# Patient Record
Sex: Female | Born: 1945 | Race: White | Hispanic: No | Marital: Married | Smoking: Never smoker
Health system: Southern US, Community
[De-identification: ages and names within clinical notes are randomized; demographics above are authoritative.]

## PROBLEM LIST (undated history)

## (undated) DIAGNOSIS — M51369 Other intervertebral disc degeneration, lumbar region without mention of lumbar back pain or lower extremity pain: Secondary | ICD-10-CM

## (undated) DIAGNOSIS — M4800 Spinal stenosis, site unspecified: Secondary | ICD-10-CM

## (undated) DIAGNOSIS — G4700 Insomnia, unspecified: Secondary | ICD-10-CM

## (undated) DIAGNOSIS — G959 Disease of spinal cord, unspecified: Secondary | ICD-10-CM

## (undated) DIAGNOSIS — M47816 Spondylosis without myelopathy or radiculopathy, lumbar region: Secondary | ICD-10-CM

## (undated) DIAGNOSIS — M1990 Unspecified osteoarthritis, unspecified site: Secondary | ICD-10-CM

## (undated) DIAGNOSIS — F419 Anxiety disorder, unspecified: Secondary | ICD-10-CM

## (undated) DIAGNOSIS — E559 Vitamin D deficiency, unspecified: Secondary | ICD-10-CM

## (undated) DIAGNOSIS — T8859XA Other complications of anesthesia, initial encounter: Secondary | ICD-10-CM

## (undated) DIAGNOSIS — E785 Hyperlipidemia, unspecified: Secondary | ICD-10-CM

## (undated) DIAGNOSIS — I499 Cardiac arrhythmia, unspecified: Secondary | ICD-10-CM

## (undated) DIAGNOSIS — L719 Rosacea, unspecified: Secondary | ICD-10-CM

## (undated) DIAGNOSIS — Z9289 Personal history of other medical treatment: Secondary | ICD-10-CM

## (undated) DIAGNOSIS — M5412 Radiculopathy, cervical region: Secondary | ICD-10-CM

## (undated) DIAGNOSIS — I10 Essential (primary) hypertension: Secondary | ICD-10-CM

## (undated) DIAGNOSIS — M47812 Spondylosis without myelopathy or radiculopathy, cervical region: Secondary | ICD-10-CM

## (undated) DIAGNOSIS — R001 Bradycardia, unspecified: Secondary | ICD-10-CM

## (undated) DIAGNOSIS — J40 Bronchitis, not specified as acute or chronic: Secondary | ICD-10-CM

## (undated) DIAGNOSIS — K219 Gastro-esophageal reflux disease without esophagitis: Secondary | ICD-10-CM

## (undated) DIAGNOSIS — E669 Obesity, unspecified: Secondary | ICD-10-CM

## (undated) HISTORY — PX: OTHER SURGICAL HISTORY: SHX169

## (undated) HISTORY — DX: Bronchitis, not specified as acute or chronic: J40

## (undated) HISTORY — DX: Gastro-esophageal reflux disease without esophagitis: K21.9

## (undated) HISTORY — DX: Obesity, unspecified: E66.9

## (undated) HISTORY — DX: Hyperlipidemia, unspecified: E78.5

## (undated) HISTORY — DX: Essential (primary) hypertension: I10

## (undated) HISTORY — PX: ABDOMINAL HYSTERECTOMY: SHX81

## (undated) HISTORY — DX: Anxiety disorder, unspecified: F41.9

## (undated) HISTORY — DX: Bradycardia, unspecified: R00.1

## (undated) HISTORY — DX: Spondylosis without myelopathy or radiculopathy, lumbar region: M47.816

## (undated) HISTORY — PX: KNEE ARTHROSCOPY: SUR90

## (undated) HISTORY — DX: Rosacea, unspecified: L71.9

## (undated) HISTORY — PX: BILATERAL CARPAL TUNNEL RELEASE: SHX6508

## (undated) HISTORY — DX: Insomnia, unspecified: G47.00

## (undated) HISTORY — DX: Unspecified osteoarthritis, unspecified site: M19.90

## (undated) HISTORY — PX: INCISION TENDON SHEATH HAND: SUR698

## (undated) HISTORY — PX: CERVICAL CONE BIOPSY: SUR198

## (undated) HISTORY — DX: Personal history of other medical treatment: Z92.89

---

## 1979-01-28 HISTORY — PX: HYSTERECTOMY ABDOMINAL WITH SALPINGECTOMY: SHX6725

## 2002-01-27 HISTORY — PX: COLONOSCOPY: SHX174

## 2003-01-28 HISTORY — PX: HALLUX VALGUS CORRECTION: SUR315

## 2004-07-20 ENCOUNTER — Emergency Department: Payer: Self-pay | Admitting: Emergency Medicine

## 2004-08-14 ENCOUNTER — Ambulatory Visit: Payer: Self-pay | Admitting: Family Medicine

## 2005-02-18 ENCOUNTER — Ambulatory Visit: Payer: Self-pay | Admitting: Family Medicine

## 2005-04-17 ENCOUNTER — Ambulatory Visit: Payer: Self-pay

## 2005-10-07 ENCOUNTER — Ambulatory Visit: Payer: Self-pay

## 2006-12-16 ENCOUNTER — Ambulatory Visit: Payer: Self-pay | Admitting: Family Medicine

## 2007-01-28 DIAGNOSIS — Z9289 Personal history of other medical treatment: Secondary | ICD-10-CM

## 2007-01-28 HISTORY — DX: Personal history of other medical treatment: Z92.89

## 2007-03-31 ENCOUNTER — Ambulatory Visit: Payer: Self-pay | Admitting: Specialist

## 2007-05-20 ENCOUNTER — Ambulatory Visit: Payer: Self-pay | Admitting: Family Medicine

## 2008-10-11 ENCOUNTER — Ambulatory Visit: Payer: Self-pay | Admitting: Family Medicine

## 2008-11-08 ENCOUNTER — Emergency Department: Payer: Self-pay | Admitting: Emergency Medicine

## 2009-12-05 ENCOUNTER — Ambulatory Visit: Payer: Self-pay | Admitting: Family Medicine

## 2010-06-18 ENCOUNTER — Ambulatory Visit: Payer: Self-pay | Admitting: Rheumatology

## 2010-06-28 ENCOUNTER — Ambulatory Visit: Payer: Self-pay | Admitting: Rheumatology

## 2010-07-26 ENCOUNTER — Ambulatory Visit: Payer: Self-pay | Admitting: Anesthesiology

## 2010-07-30 ENCOUNTER — Ambulatory Visit: Payer: Self-pay | Admitting: Orthopedic Surgery

## 2010-09-04 ENCOUNTER — Ambulatory Visit: Payer: Self-pay | Admitting: Physician Assistant

## 2010-09-23 ENCOUNTER — Ambulatory Visit: Payer: Self-pay | Admitting: Orthopedic Surgery

## 2010-10-14 ENCOUNTER — Ambulatory Visit: Payer: Self-pay | Admitting: Orthopedic Surgery

## 2010-10-28 HISTORY — PX: COLONOSCOPY: SHX174

## 2010-11-21 ENCOUNTER — Inpatient Hospital Stay: Payer: Self-pay | Admitting: Orthopedic Surgery

## 2010-11-21 HISTORY — PX: JOINT REPLACEMENT: SHX530

## 2010-11-25 LAB — PATHOLOGY REPORT

## 2011-08-30 ENCOUNTER — Ambulatory Visit: Payer: Self-pay | Admitting: Medical

## 2011-09-12 ENCOUNTER — Ambulatory Visit: Payer: Self-pay

## 2011-10-27 ENCOUNTER — Ambulatory Visit: Payer: Self-pay | Admitting: Orthopedic Surgery

## 2011-10-27 LAB — PROTIME-INR: Prothrombin Time: 12.7 secs (ref 11.5–14.7)

## 2011-10-27 LAB — SEDIMENTATION RATE: Erythrocyte Sed Rate: 5 mm/h

## 2011-10-27 LAB — APTT: Activated PTT: 28.6 s

## 2011-10-27 LAB — MRSA PCR SCREENING

## 2011-11-01 ENCOUNTER — Ambulatory Visit: Payer: Self-pay | Admitting: Orthopedic Surgery

## 2011-11-04 ENCOUNTER — Inpatient Hospital Stay: Payer: Self-pay | Admitting: Orthopedic Surgery

## 2011-11-04 HISTORY — PX: JOINT REPLACEMENT: SHX530

## 2011-11-05 LAB — BASIC METABOLIC PANEL
BUN: 6 mg/dL — ABNORMAL LOW (ref 7–18)
Calcium, Total: 8.1 mg/dL — ABNORMAL LOW (ref 8.5–10.1)
Co2: 26 mmol/L (ref 21–32)
Creatinine: 0.47 mg/dL — ABNORMAL LOW (ref 0.60–1.30)
EGFR (African American): >60
Glucose: 108 mg/dL — ABNORMAL HIGH (ref 65–99)
Osmolality: 265 (ref 275–301)
Potassium: 3.5 mmol/L (ref 3.5–5.1)

## 2011-11-05 LAB — HEMOGLOBIN: HGB: 11.8 g/dL — ABNORMAL LOW (ref 12.0–16.0)

## 2011-11-06 LAB — URINALYSIS, COMPLETE
Blood: NEGATIVE
Glucose,UR: NEGATIVE mg/dL (ref 0–75)
Ketone: NEGATIVE
Specific Gravity: 1.002 (ref 1.003–1.030)
Squamous Epithelial: NONE SEEN
WBC UR: <1 /HPF (ref 0–5)

## 2011-11-06 LAB — PATHOLOGY REPORT

## 2011-12-18 ENCOUNTER — Ambulatory Visit: Payer: Self-pay | Admitting: Orthopedic Surgery

## 2011-12-18 HISTORY — PX: KNEE ARTHROSCOPY: SUR90

## 2012-02-05 ENCOUNTER — Ambulatory Visit: Payer: Self-pay | Admitting: Orthopedic Surgery

## 2012-03-03 ENCOUNTER — Ambulatory Visit: Payer: Self-pay | Admitting: Family Medicine

## 2012-03-18 ENCOUNTER — Ambulatory Visit: Payer: Self-pay | Admitting: Orthopedic Surgery

## 2012-04-16 ENCOUNTER — Other Ambulatory Visit: Payer: Self-pay

## 2012-04-16 LAB — SYNOVIAL CELL COUNT + DIFF, W/ CRYSTALS
Basophil: 0 %
Eosinophil: 1 %
Nucleated Cell Count: 74364 /mm3

## 2012-04-20 LAB — BODY FLUID CULTURE

## 2012-04-27 HISTORY — PX: JOINT REPLACEMENT: SHX530

## 2012-05-11 ENCOUNTER — Ambulatory Visit: Payer: Self-pay | Admitting: Orthopedic Surgery

## 2012-05-11 LAB — BASIC METABOLIC PANEL
Anion Gap: 5 — ABNORMAL LOW (ref 7–16)
BUN: 10 mg/dL (ref 7–18)
Chloride: 104 mmol/L (ref 98–107)
Co2: 29 mmol/L (ref 21–32)
Creatinine: 0.66 mg/dL (ref 0.60–1.30)
EGFR (African American): >60
Osmolality: 274 (ref 275–301)
Sodium: 138 mmol/L (ref 136–145)

## 2012-05-11 LAB — CBC
HCT: 38.5 % (ref 35.0–47.0)
MCH: 29.7 pg (ref 26.0–34.0)
MCHC: 33.7 g/dL (ref 32.0–36.0)
Platelet: 164 10*3/uL (ref 150–440)
WBC: 4.2 10*3/uL (ref 3.6–11.0)

## 2012-05-11 LAB — MRSA PCR SCREENING

## 2012-05-11 LAB — SEDIMENTATION RATE: Erythrocyte Sed Rate: 8 mm/hr (ref 0–30)

## 2012-05-18 ENCOUNTER — Inpatient Hospital Stay: Payer: Self-pay | Admitting: Orthopedic Surgery

## 2012-05-19 LAB — BASIC METABOLIC PANEL
Anion Gap: 5 — ABNORMAL LOW (ref 7–16)
BUN: 8 mg/dL (ref 7–18)
Chloride: 104 mmol/L (ref 98–107)
EGFR (African American): >60
EGFR (Non-African Amer.): >60
Glucose: 93 mg/dL (ref 65–99)
Osmolality: 272 (ref 275–301)
Potassium: 3.3 mmol/L — ABNORMAL LOW (ref 3.5–5.1)
Sodium: 137 mmol/L (ref 136–145)

## 2012-05-19 LAB — HEMOGLOBIN: HGB: 11.4 g/dL — ABNORMAL LOW (ref 12.0–16.0)

## 2012-05-20 LAB — BASIC METABOLIC PANEL
Anion Gap: 3 — ABNORMAL LOW (ref 7–16)
BUN: 6 mg/dL — ABNORMAL LOW (ref 7–18)
Chloride: 106 mmol/L (ref 98–107)
Co2: 29 mmol/L (ref 21–32)
Creatinine: 0.74 mg/dL (ref 0.60–1.30)
EGFR (Non-African Amer.): >60
Glucose: 125 mg/dL — ABNORMAL HIGH (ref 65–99)
Osmolality: 275 (ref 275–301)
Potassium: 3.3 mmol/L — ABNORMAL LOW (ref 3.5–5.1)
Sodium: 138 mmol/L (ref 136–145)

## 2012-05-21 LAB — PATHOLOGY REPORT

## 2012-06-30 DIAGNOSIS — Z96659 Presence of unspecified artificial knee joint: Secondary | ICD-10-CM | POA: Diagnosis not present

## 2012-08-18 DIAGNOSIS — M719 Bursopathy, unspecified: Secondary | ICD-10-CM | POA: Diagnosis not present

## 2012-08-18 DIAGNOSIS — M67919 Unspecified disorder of synovium and tendon, unspecified shoulder: Secondary | ICD-10-CM | POA: Diagnosis not present

## 2012-08-30 DIAGNOSIS — M25519 Pain in unspecified shoulder: Secondary | ICD-10-CM | POA: Diagnosis not present

## 2012-09-03 DIAGNOSIS — M67919 Unspecified disorder of synovium and tendon, unspecified shoulder: Secondary | ICD-10-CM | POA: Diagnosis not present

## 2012-09-03 DIAGNOSIS — M719 Bursopathy, unspecified: Secondary | ICD-10-CM | POA: Diagnosis not present

## 2012-09-13 DIAGNOSIS — M6281 Muscle weakness (generalized): Secondary | ICD-10-CM | POA: Diagnosis not present

## 2012-09-13 DIAGNOSIS — M67919 Unspecified disorder of synovium and tendon, unspecified shoulder: Secondary | ICD-10-CM | POA: Diagnosis not present

## 2012-09-13 DIAGNOSIS — M25519 Pain in unspecified shoulder: Secondary | ICD-10-CM | POA: Diagnosis not present

## 2012-09-13 DIAGNOSIS — M719 Bursopathy, unspecified: Secondary | ICD-10-CM | POA: Diagnosis not present

## 2012-09-13 DIAGNOSIS — M25619 Stiffness of unspecified shoulder, not elsewhere classified: Secondary | ICD-10-CM | POA: Diagnosis not present

## 2012-09-15 DIAGNOSIS — M67919 Unspecified disorder of synovium and tendon, unspecified shoulder: Secondary | ICD-10-CM | POA: Diagnosis not present

## 2012-09-15 DIAGNOSIS — M25619 Stiffness of unspecified shoulder, not elsewhere classified: Secondary | ICD-10-CM | POA: Diagnosis not present

## 2012-09-15 DIAGNOSIS — M25519 Pain in unspecified shoulder: Secondary | ICD-10-CM | POA: Diagnosis not present

## 2012-09-15 DIAGNOSIS — M6281 Muscle weakness (generalized): Secondary | ICD-10-CM | POA: Diagnosis not present

## 2012-09-15 DIAGNOSIS — M719 Bursopathy, unspecified: Secondary | ICD-10-CM | POA: Diagnosis not present

## 2012-09-20 DIAGNOSIS — E785 Hyperlipidemia, unspecified: Secondary | ICD-10-CM | POA: Diagnosis not present

## 2012-09-20 DIAGNOSIS — I10 Essential (primary) hypertension: Secondary | ICD-10-CM | POA: Diagnosis not present

## 2012-09-20 DIAGNOSIS — K219 Gastro-esophageal reflux disease without esophagitis: Secondary | ICD-10-CM | POA: Diagnosis not present

## 2012-09-20 DIAGNOSIS — G4700 Insomnia, unspecified: Secondary | ICD-10-CM | POA: Diagnosis not present

## 2012-09-21 DIAGNOSIS — E785 Hyperlipidemia, unspecified: Secondary | ICD-10-CM | POA: Diagnosis not present

## 2012-09-21 DIAGNOSIS — I10 Essential (primary) hypertension: Secondary | ICD-10-CM | POA: Diagnosis not present

## 2012-09-21 DIAGNOSIS — R634 Abnormal weight loss: Secondary | ICD-10-CM | POA: Diagnosis not present

## 2012-09-29 DIAGNOSIS — M25519 Pain in unspecified shoulder: Secondary | ICD-10-CM | POA: Diagnosis not present

## 2012-09-29 DIAGNOSIS — M6281 Muscle weakness (generalized): Secondary | ICD-10-CM | POA: Diagnosis not present

## 2012-09-29 DIAGNOSIS — M719 Bursopathy, unspecified: Secondary | ICD-10-CM | POA: Diagnosis not present

## 2012-09-29 DIAGNOSIS — M67919 Unspecified disorder of synovium and tendon, unspecified shoulder: Secondary | ICD-10-CM | POA: Diagnosis not present

## 2012-09-29 DIAGNOSIS — M25619 Stiffness of unspecified shoulder, not elsewhere classified: Secondary | ICD-10-CM | POA: Diagnosis not present

## 2012-10-14 ENCOUNTER — Ambulatory Visit: Payer: Self-pay | Admitting: Family Medicine

## 2012-10-14 DIAGNOSIS — E2839 Other primary ovarian failure: Secondary | ICD-10-CM | POA: Diagnosis not present

## 2012-10-14 DIAGNOSIS — Z13820 Encounter for screening for osteoporosis: Secondary | ICD-10-CM | POA: Diagnosis not present

## 2012-11-05 DIAGNOSIS — H35039 Hypertensive retinopathy, unspecified eye: Secondary | ICD-10-CM | POA: Diagnosis not present

## 2013-02-09 DIAGNOSIS — G4700 Insomnia, unspecified: Secondary | ICD-10-CM | POA: Diagnosis not present

## 2013-02-09 DIAGNOSIS — F341 Dysthymic disorder: Secondary | ICD-10-CM | POA: Diagnosis not present

## 2013-04-14 DIAGNOSIS — I10 Essential (primary) hypertension: Secondary | ICD-10-CM | POA: Diagnosis not present

## 2013-04-14 DIAGNOSIS — K219 Gastro-esophageal reflux disease without esophagitis: Secondary | ICD-10-CM | POA: Diagnosis not present

## 2013-04-14 DIAGNOSIS — Z0000 Encounter for general adult medical examination without abnormal findings: Secondary | ICD-10-CM | POA: Diagnosis not present

## 2013-04-14 DIAGNOSIS — E785 Hyperlipidemia, unspecified: Secondary | ICD-10-CM | POA: Diagnosis not present

## 2013-05-17 DIAGNOSIS — J40 Bronchitis, not specified as acute or chronic: Secondary | ICD-10-CM | POA: Diagnosis not present

## 2013-05-17 DIAGNOSIS — R7981 Abnormal blood-gas level: Secondary | ICD-10-CM | POA: Diagnosis not present

## 2013-05-20 DIAGNOSIS — J40 Bronchitis, not specified as acute or chronic: Secondary | ICD-10-CM | POA: Diagnosis not present

## 2013-07-11 DIAGNOSIS — Z96649 Presence of unspecified artificial hip joint: Secondary | ICD-10-CM | POA: Diagnosis not present

## 2013-07-11 DIAGNOSIS — Z96659 Presence of unspecified artificial knee joint: Secondary | ICD-10-CM | POA: Diagnosis not present

## 2013-09-27 DIAGNOSIS — Z23 Encounter for immunization: Secondary | ICD-10-CM | POA: Diagnosis not present

## 2013-09-27 DIAGNOSIS — E785 Hyperlipidemia, unspecified: Secondary | ICD-10-CM | POA: Diagnosis not present

## 2013-09-27 DIAGNOSIS — F341 Dysthymic disorder: Secondary | ICD-10-CM | POA: Diagnosis not present

## 2013-09-27 DIAGNOSIS — I10 Essential (primary) hypertension: Secondary | ICD-10-CM | POA: Diagnosis not present

## 2013-09-29 DIAGNOSIS — E785 Hyperlipidemia, unspecified: Secondary | ICD-10-CM | POA: Diagnosis not present

## 2013-09-29 DIAGNOSIS — I10 Essential (primary) hypertension: Secondary | ICD-10-CM | POA: Diagnosis not present

## 2013-11-07 DIAGNOSIS — J40 Bronchitis, not specified as acute or chronic: Secondary | ICD-10-CM | POA: Diagnosis not present

## 2013-11-22 DIAGNOSIS — H2513 Age-related nuclear cataract, bilateral: Secondary | ICD-10-CM | POA: Diagnosis not present

## 2014-05-16 NOTE — Discharge Summary (Signed)
PATIENT NAME:  Kari Lee, Kari Lee MR#:  [id number] DATE OF BIRTH:  1946/01/17  DATE OF ADMISSION:  11/04/2011 DATE OF DISCHARGE:  11/07/2011  ADDENDUM  HISTORY: The patient is a 69 year old who had severe left hip osteoarthritis. Left leg has been giving out on her. She has had difficulty extending the leg and weight bearing.   PHYSICAL EXAMINATION: The patient has anterior groin discomfort with range of motion of the left hip with internal and external rotation as well as flexion. She has limited internal and external rotation. HEENT: Head normocephalic, atraumatic. LUNGS: Clear to auscultation. HEART: Regular rate and rhythm.   HOSPITAL COURSE: The patient was admitted to the hospital on 11/04/2011. She had surgery that same day and was brought to the orthopedic floor from the PAC-U in stable condition. On postoperative day one, vital signs and lab work was monitored. She progressed very well with physical therapy. She had a bowel movement and was ready for discharge on 11/07/2011.   DISCHARGE INSTRUCTIONS: The patient was here for a total hip replacement on the left. She should elevate the affected foot and leg on 1 or 2 pillows with the foot higher than the knee. She may resume a regular diet as tolerated. She should use Xarelto 10 mg oral once a day until complete. Pain medications consist of Ultram 1 to 2 tablets every six hours as needed for pain and oxycodone 5 to 10 mg every four hours as needed for pain. Wound care - apply an ice pack to the affected area. Do not get the dressing or bandage wet or dirty. Call [facility] if the dressing gets water under it. Leave the dressing on. Symptoms to report - [facility] if any of the following occur. Bright red bleeding from the incision wound, fever above 101.5 degrees, redness, swelling, or drainage at the incision. Call [facility] if you experience any increased leg pain, numbness or weakness in the  legs or bowel or bladder symptoms. She is referred to home physical therapy. She should contact us if they have not contacted her within 48 hours. She has a follow-up appointment scheduled with [facility] in two weeks. She should call for an appointment.      DISCHARGE MEDICATIONS:  1. Celebrex 200 mg one orally once a day in the morning.  2. Hydrochlorothiazide/losartan 12.5 mg/50 mg oral tablet 1 tablet orally once a day in the morning.  3. Simvastatin 40 mg 1 tablet orally once a day at bedtime.  4. Calcium 600 mg plus D 1 tablet orally two times a day.  5. Norco 5 mg/325 mg oral tablet 1 tablet orally six hours as needed. 6. Centrum Silver Ultra Women's 1 tab orally once a day in the morning.  7. Ambien 10 mg oral tablet 1 tablet orally once daily at bedtime.  8. Omeprazole 20 mg oral delayed release tablet 1 tablet orally once a day in the morning.  9. Estropipate 0.75 mg oral tablet 1 tablet orally once a day in the morning. 10. Xarelto 10 mg 1 tab p.o. every morning until finished.  ____________________________ Evon Slackhomas C. Jettson Crable, PA-C tcg:slb D: 11/07/2011 12:54:46 ET T: 11/07/2011 15:32:56 ET JOB#: [id number]  cc: Evon Slackhomas C. Carly Sabo, PA-C, <Dictator> Evon SlackHOMAS C Marche Hottenstein GeorgiaPA ELECTRONICALLY SIGNED 11/11/2011 12:38

## 2014-05-16 NOTE — Discharge Summary (Signed)
PATIENT NAME:  Kari Lee, Kari Lee MR#:  [id number] DATE OF BIRTH:  1945/06/02  DATE OF ADMISSION:  11/04/2011 DATE OF DISCHARGE:  11/07/2011  ADMITTING DIAGNOSIS: Left hip osteoarthritis.   DISCHARGE DIAGNOSIS: Left hip osteoarthritis.   PROCEDURE PERFORMED: Left total hip replacement, anterior approach.   SURGEON: Leitha SchullerMichael J. Menz, M.D.   ASSISTANT: Devota PaceApril Berndt, NP, Cranston Neighborhris Gaines, PA-C.   ANESTHESIA: Spinal.   ESTIMATED BLOOD LOSS: 500 mL with 250 mL reinfused from Cell Saver.   SPECIMENS: Femoral head.   COMPLICATIONS: None.   CONDITION: To recovery room stable.  IMPLANTS: Medacta Amis standard stem size 3, a medium 28 mm head with a 50 mm dual mobility liner and a 50 mm DM with Versa Fit cup.    CONDITION: To recovery room stable.   HISTORY: Patient is a 69 year old female who has had medial compartment arthritis in the left knee. She has had severe left hip osteoarthritis. About five to six days ago she was walking down steps twisted her knee and felt a pop. Since that time she has had difficulty standing, bearing weight on the leg. She says it feels like her right knee did when she had a torn meniscus there.(dictation cut off here)   ____________________________ [name], PA-C tcg:cms D: 11/07/2011 08:05:18 ET T: 11/07/2011 13:17:15 ET JOB#: [id number]  cc: [name], PA-C, <Dictator> Evon SlackHOMAS C GAINES PA ELECTRONICALLY SIGNED 12/04/2011 12:20

## 2014-05-16 NOTE — Op Note (Signed)
PATIENT NAME:  Kari Lee, Kari Lee MR#:  [id number] DATE OF BIRTH:  1945-10-19  DATE OF PROCEDURE:  12/18/2011  PREOPERATIVE DIAGNOSIS: Osteoarthritis and medial meniscus tear, left knee.   POSTOPERATIVE DIAGNOSES:  1. Osteoarthritis and medial meniscus tear, left knee. 2. Lateral meniscus tear and plica band.   PROCEDURES: 1. Arthroscopy, left knee, with partial medial and lateral meniscectomy. 2. Partial synovectomy with plica excision.  ANESTHESIA: General.   SURGEON: Leitha SchullerMichael J. Jarman Litton, MD    DESCRIPTION OF PROCEDURE: The patient was brought to the operating room and after adequate anesthesia was obtained the left leg was placed in the arthroscopic legholder. After prepping and draping in the usual sterile fashion, appropriate patient identification and time-out procedures were completed. An inferolateral portal was made and the arthroscope was introduced. Initial inspection revealed moderately advanced patellofemoral degenerative change and a very thick large medial plica. Coming to the medial compartment, there was an approximately 3 mm wide 1 cm long area of the tibial condyle with exposed bone. Adjacent to this was a complex and very large tear of the medial meniscus. It involved the entire middle and posterior thirds of the meniscus. There were vertical and horizontal components. The ACL was intact and the lateral compartment showed relatively intact articular cartilage with tear at the junction of the middle and posterior thirds of degenerative flap tear. This was debrided with use of an ArthroCare wand. Going to the medial compartment, meniscal punch shaver and then ArthroCare wand were used to ablate the tissue and a shaver and ArthroCare wand were used to excise the medial plica that was impinging over the medial femoral condyle as well as getting up into the patellofemoral joint. After checking the gutters that there were no loose bodies, the knee was thoroughly irrigated and all  instrumentation withdrawn. The wounds were closed with simple interrupted 4-0 nylon skin suture and the knee injected with 20 mL of 0.5% Sensorcaine with epinephrine. Sterile dressings of Xeroform, 4 x 4's, Webril, and Ace wrap were applied. The patient was sent to the recovery room in stable condition.   ESTIMATED BLOOD LOSS: Minimal.   COMPLICATIONS: None.   SPECIMENS: None. Pre and postprocedure pictures obtained.   ____________________________ Leitha SchullerMichael J. Belissa Kooy, MD mjm:drc D: 12/19/2011 00:24:09 ET T: 12/19/2011 10:04:14 ET JOB#: [id number]  cc: Leitha SchullerMichael J. Drianna Chandran, MD, <Dictator> Leitha SchullerMICHAEL J Zacharia Sowles MD ELECTRONICALLY SIGNED 12/19/2011 12:26

## 2014-05-16 NOTE — Op Note (Signed)
PATIENT NAME:  Kari Lee, Kari Lee MR#:  [id number] DATE OF BIRTH:  08-Jun-1945  DATE OF PROCEDURE:  11/04/2011  PREOPERATIVE DIAGNOSIS: Left hip osteoarthritis.   POSTOPERATIVE DIAGNOSIS: Left hip osteoarthritis.   PROCEDURE PERFORMED: left total hip replacement, anterior approach   SURGEON: Leitha Schuller, M.D.   ASSISTANT: April Berndt, NP, Cranston Neighbor, PA-C  ANESTHESIA: Spinal.  DESCRIPTION OF PROCEDURE: The patient was brought to the Operating Room and after adequate anesthesia was obtained the patient was placed on the operative table with the Medacta  attachment for anterior hip approach. The left leg was placed in the traction boot and the right leg in a well legholder. C-arm was brought in and preop C-arm view was printed for subsequent comparison of the trials. The hip was then prepped and draped using the barrier drape method. An anterior incision was made in line with the anterior/superior iliac spine and lateral carpal condyle. Skin and subcutaneous tissue were incised and the tensor muscle was identified and fascia incised, the muscle retracted laterally, and the deep fascia was then incised. The interval between the rectus and gluteus was then identified. The rectus sheath was incised and the rectus retracted medially. Anterior circumflex femoral vessels cauterized. The anterior capsule was exposed and a capsulotomy carried out with retractors placed. A femoral neck cut was made with C-arm helping to guide this. The head was then removed. It was noted to have eburnated bone as did the acetabulum. The labrum was excised along with ligament of teres and there was noted to be significant wear in the acetabulum as well. Sequential reaming was carried out at 50 mm. This gave good bleeding bone and it appeared to be the appropriate size, 50 trial fit well. A 50 component was then impacted into place and was stable. Next, the leg was externally rotated and the posterior capsule was released to  allow for lateralization and elevation of the femur. With the leg dropped into extension, externally rotated and adducted the proximal femur was well visualized. Sequential broaching was carried out after first using a box osteotome and trial broach. The #3 stem was very snug and with a #3 stem trials were placed. It appeared to be the appropriate size and so the final #3 stem was impacted.  It was trialed with the large and medium heads. The medium appeared to give a better fit compared to preop x-ray and was chosen as the final components. Those components were assembled to the dual mobility head. This was placed onto the stem, impacted, and the hip was reduced and was very stable. Leg lengths appeared equal and final x-ray showed equal leg lengths. The wound was irrigated. The deep tissue was infiltrated with 10 mg of morphine and 30 mL of 0.25% Sensorcaine with epinephrine. The capsule was repaired using 0 Ethibond anteriorly. The tensor fascia was repaired using a running heavy quill suture. Subcutaneous Hemovac drain was placed followed by 2-0 Vicryl quill subcutaneously and skin staples. Xeroform, 4 x 4's, ABD, and tape applied. The patient was sent to the recovery room in stable condition.   ESTIMATED BLOOD LOSS: 500 mL with 250 mL reinfused from Cell Saver.   SPECIMENS: Femoral head.   COMPLICATIONS: None.   CONDITION: To recovery room stable.  IMPLANTS: Medacta Amis standard stem size 3, a medium 28 mm head with a 50 mm dull mobility liner, and a 50 mm DM with Versa Fit cup. ____________________________ Leitha Schuller, MD mjm:slb D: 11/04/2011 12:17:57 ET T: 11/04/2011 12:35:08  ET JOB#: [id number]  cc: Leitha SchullerMichael J. Aliviah Spain, MD, <Dictator> Nolon BussingMICHAEL J [facility] MD ELECTRONICALLY SIGNED 11/04/2011 15:10

## 2014-05-19 NOTE — Op Note (Signed)
PATIENT NAME:  Kari Lee, Kari Lee MR#:  [id number] DATE OF BIRTH:  Jun 24, 1945  DATE OF PROCEDURE:  05/18/2012  PREOPERATIVE DIAGNOSIS: Left knee osteoarthritis.   POSTOPERATIVE DIAGNOSIS: Left knee osteoarthritis.   PROCEDURE: Left total knee replacement.   ANESTHESIA: Spinal.   SURGEON: Leitha SchullerMichael J. Sopheap Boehle, M.D.   ASSISTANT:  Cranston Neighborhris Gaines, PA-C.   DESCRIPTION OF PROCEDURE: The patient was brought to the Operating Room and after adequate anesthesia was obtained, the knee was prepped and draped in the usual sterile fashion with a tourniquet applied to the upper thigh. After patient identification and timeout procedures were completed, the tourniquet was raised to 300 mmHg after exsanguinating with an Esmarch. A midline skin incision was made with the knee in flexion, followed by a medial parapatellar arthrotomy, the anterior horns of the menisci excised along with the infrapatellar fat pad and the ACL. The proximal tibia was exposed to allow for application of the Medacta MyKnee cutting guide. After removing cartilage, the block was applied and alignment appeared to be appropriate. Pins were placed and the proximal tibia cut carried out with removal of posterior horns as well of the meniscus. The femoral block was then applied, after excising cartilage again and checking alignment. It also appeared to be appropriate. Anterior, posterior and chamfer cuts were then carried out. Trial components were placed with rotation on the tibial side based off one of the guide pins from the initial cutting block, size 3 on the tibia, size 4 with subsequent 4 narrow implant on the femur. The 12 mm insert gave excellent stability in extension, mid flexion and flexion. Distal drill holes were made through the femoral trial, along with the notch cut for the trochlear groove. These trial components were then removed, after drilling the proximal hole and notch on the tibial side for subsequent implant fixation. The patella was  cut using the cutting guide and it measured a size 2 after drill holes were placed. At this point, the bony surfaces were thoroughly irrigated and dried. A local anesthetic was infiltrated. Exparel was diluted and injected in the surrounding tissue posterior, as well as around the arthrotomy and in the synovium. At this point, the tibial component was cemented into place first, followed by the tibial insert with excess cement removed, followed by the femoral component. The knee was held in extension as the patellar button was clamped into place. Again, all components were cemented. After the cement had set, the knee was thoroughly irrigated. Tourniquet was let down for hemostasis and the wound closed with a heavy quill suture for the arthrotomy, 2-0 quill subcutaneously and skin staples. Xeroform, 4 x 4's, Polar Care, Webril and Ace wrap were applied along with a knee immobilizer. The patient was sent to the recovery room in stable condition.   ESTIMATED BLOOD LOSS: 100 mL.   TOURNIQUET TIME: 59 minutes at 300 mmHg.  IMPLANTS: Medacta GMK primary total knee, left 3 fixed tibial tray with a size 3, 12 mm UC insert, a 4 narrow left standard femoral component and a size 2 cemented patella.   SPECIMEN: Cut ends of bone.   CONDITION: To recovery room stable.   ____________________________ Leitha SchullerMichael J. Hyland Mollenkopf, MD mjm:jm D: 05/18/2012 18:15:45 ET T: 05/18/2012 19:17:09 ET JOB#: [id number]  cc: Leitha SchullerMichael J. Cleopha Indelicato, MD, <Dictator> Leitha SchullerMICHAEL J Aleksi Brummet MD ELECTRONICALLY SIGNED 05/19/2012 7:57

## 2014-05-19 NOTE — Op Note (Signed)
PATIENT NAME:  Kari Lee, Kari Lee MR#:  [id number] DATE OF BIRTH:  [birth year]  DATE OF PROCEDURE:  02/05/2012  PREOPERATIVE DIAGNOSIS: Left medial meniscus recurrent tear and arthritis.   POSTOPERATIVE DIAGNOSIS: Left medial meniscus recurrent tear and arthritis.   PROCEDURE: Arthroscopy of left knee with medial meniscectomy.   SURGEON: Leitha SchullerMichael J. Efraim Vanallen, M.D.   ANESTHESIA: General.  DESCRIPTION OF PROCEDURE: The patient was brought to the operating room and after adequate anesthesia was obtained the left leg was prepped and draped in the usual sterile fashion with the arthroscopic legholder and tourniquet applied. After patient identification and timeout procedures were completed, prior inferolateral portal was opened with scalpel and the arthroscope introduced. Inspection revealed moderate patellofemoral degenerative change, no significant suprapatellar synovitis. Coming around medially, an inferomedial portal was made and a probe introduced. There was exposed bone on the femoral and tibial condyles at the most medial edge. There was fibrillation of the middle third of the meniscus and in the posterior horn there was a new horizontal tear back to the capsule. The ACL was intact and the lateral compartment showed fissuring to the bone but no tear on probing. An arthroscopic shaver was then used to debride the medial meniscus back near its capsule essentially performing complete meniscectomy. An ArthroCare wand was used to ablate the residual tissue and also for hemostasis. The knee was thoroughly irrigated, all instrumentation was withdrawn and the wounds were closed in simple interrupted 4-0 nylon. Sterile dressing of Xeroform, 4 x 4's, Webril and Ace wrap were applied. The patient was sent to the recovery room in stable condition.   ESTIMATED BLOOD LOSS: Minimal.        COMPLICATIONS: None.   SPECIMENS: None.  ____________________________ Leitha SchullerMichael J. Brett Darko, MD mjm:sb D: 02/05/2012 12:00:05  ET T: 02/05/2012 12:19:57 ET JOB#: [id number]  cc: Leitha SchullerMichael J. Tangia Pinard, MD, <Dictator> Leitha SchullerMICHAEL J [place] Mcphee MD ELECTRONICALLY SIGNED 02/05/2012 15:13

## 2014-05-19 NOTE — Discharge Summary (Signed)
PATIENT NAME:  Lee, Kari K MR#:  [id number] DATE OF BIRTH:  [birth year]  DATE OF ADMISSION:  05/18/2012 DATE OF DISCHARGE:  05/21/2012   ADMITTING DIAGNOSIS: Left knee osteoarthritis.   DISCHARGE DIAGNOSIS: Left knee osteoarthritis.   OPERATION: On 05/18/2012, the patient had a left total knee arthroplasty by Dr. Rosita KeaMenz.   ASSISTANT: Cranston Neighborhris Gaines, PA-C   ANESTHESIA: Spinal.   TOURNIQUET TIME: 59 minutes at 300 mmHg.   ESTIMATED BLOOD LOSS: 100 mL.   IMPLANTS: Medacta GMK primary total knee, left 36 tibial tray with a size 3, 12 mm UC insert, and 4 narrow left standard femoral component and a size 2 cemented patella.   CONDITION: The patient was stabilized with no complication and was sent to the recovery room and then down to the orthopedic floor.   HISTORY: The patient is a 69 year old female who presented for evaluation of her left knee. The patient has been refractory to conservative treatment. The patient has had debilitating pain with ambulation and rest even. The patient has tried anti-inflammatories and pain control with no relief, as well as injections.   PHYSICAL EXAMINATION:  GENERAL: Alert female with some difficulty with transfers.  LUNGS: Clear to auscultation.  HEART: Regular rate and rhythm.  MUSCULOSKELETAL: In regard to the left lower extremity, the patient has 10 degrees from extension, 105 degrees flexion with pain. The patient has crepitus with motion. The patient has a large Baker's cyst with discomfort. The patient has intact dorsalis pedis pulse and sensation intact.   HOSPITAL COURSE: After initial admission on the 22nd of April, the patient was brought to the orthopedic floor. On postoperative day 1, the patient's hemoglobin was 11.4 and remained stable there. The patient worked with physical therapy, initially bed to chair and progressed up to ambulating about 250 feet. The patient did, on the day of discharge, have significant swelling and increased pain as  the numbing medicine was wearing off. The patient was a little bit slower with activity, although she did have bowel movement and was ready to be discharged home with home health physical therapy and no other complications.   CONDITION AT DISCHARGE: Stable.   DISPOSITION: The patient was sent home with home health physical therapy.   DISCHARGE INSTRUCTIONS:  1. The patient to [facility] up [facility] in about 2 weeks for staple removal.  2. The patient is to do weight-bear as tolerated on the affected leg.  3. The patient will raise her leg with 1 to 2 pillows.  4. The patient will use thigh-high TED hose on both legs, removed at nighttime.  5. The patient will elevate her heels off the bed.  6. The patient will use regular diet.  7. The patient will use a Polar Care to decrease swelling.  8. The patient will keep her dressing clean and dry and try not to get it wet. The patient will leave it on though.  9. The patient will do physical therapy for gait training and range of motion activities.  10. The patient will call the clinic if there is any bright red bleeding, calf pain, bowel or bladder difficulty or any fever greater than 101.5.   DISCHARGE MEDICATIONS: To resume home medication and to use Xarelto 10 mg orally 1 tablet a day for 12 days. The patient will use Celebrex 200 mg twice a day instead of once a day. The patient will use oxycodone 5 mg 1 tablet every 4 to 6 hours as needed for severe  pain.   ____________________________ Shela Commons. Dedra Skeens, [place] jtm:OSi D: 05/21/2012 07:30:05 ET T: 05/21/2012 07:51:35 ET JOB#: [id number]  cc: J. Dedra Skeens, [place], <Dictator> J Tausha Milhoan [facility] PA ELECTRONICALLY SIGNED 05/22/2012 6:34

## 2015-08-16 LAB — HM MAMMOGRAPHY: HM Mammogram: NORMAL (ref 0–4)

## 2016-02-18 DIAGNOSIS — R69 Illness, unspecified: Secondary | ICD-10-CM | POA: Diagnosis not present

## 2016-03-19 DIAGNOSIS — H6123 Impacted cerumen, bilateral: Secondary | ICD-10-CM | POA: Diagnosis not present

## 2016-03-19 DIAGNOSIS — I10 Essential (primary) hypertension: Secondary | ICD-10-CM | POA: Diagnosis not present

## 2016-03-19 DIAGNOSIS — E782 Mixed hyperlipidemia: Secondary | ICD-10-CM | POA: Diagnosis not present

## 2016-03-19 DIAGNOSIS — Z79899 Other long term (current) drug therapy: Secondary | ICD-10-CM | POA: Diagnosis not present

## 2016-03-19 DIAGNOSIS — R69 Illness, unspecified: Secondary | ICD-10-CM | POA: Diagnosis not present

## 2016-03-19 DIAGNOSIS — Z7689 Persons encountering health services in other specified circumstances: Secondary | ICD-10-CM | POA: Diagnosis not present

## 2016-05-12 DIAGNOSIS — M25551 Pain in right hip: Secondary | ICD-10-CM | POA: Diagnosis not present

## 2016-05-12 DIAGNOSIS — M47817 Spondylosis without myelopathy or radiculopathy, lumbosacral region: Secondary | ICD-10-CM | POA: Diagnosis not present

## 2016-05-12 DIAGNOSIS — M25552 Pain in left hip: Secondary | ICD-10-CM | POA: Diagnosis not present

## 2016-05-20 ENCOUNTER — Encounter (INDEPENDENT_AMBULATORY_CARE_PROVIDER_SITE_OTHER): Payer: Self-pay

## 2016-05-20 ENCOUNTER — Ambulatory Visit (INDEPENDENT_AMBULATORY_CARE_PROVIDER_SITE_OTHER): Payer: Medicare HMO | Admitting: Family Medicine

## 2016-05-20 ENCOUNTER — Encounter: Payer: Self-pay | Admitting: Family Medicine

## 2016-05-20 VITALS — BP 132/72 | HR 69 | Temp 98.4°F | Resp 16 | Ht 64.0 in | Wt 200.6 lb

## 2016-05-20 DIAGNOSIS — G4700 Insomnia, unspecified: Secondary | ICD-10-CM | POA: Insufficient documentation

## 2016-05-20 DIAGNOSIS — I10 Essential (primary) hypertension: Secondary | ICD-10-CM | POA: Insufficient documentation

## 2016-05-20 DIAGNOSIS — L719 Rosacea, unspecified: Secondary | ICD-10-CM | POA: Insufficient documentation

## 2016-05-20 DIAGNOSIS — R232 Flushing: Secondary | ICD-10-CM

## 2016-05-20 DIAGNOSIS — Z96642 Presence of left artificial hip joint: Secondary | ICD-10-CM | POA: Insufficient documentation

## 2016-05-20 DIAGNOSIS — R001 Bradycardia, unspecified: Secondary | ICD-10-CM | POA: Insufficient documentation

## 2016-05-20 DIAGNOSIS — E669 Obesity, unspecified: Secondary | ICD-10-CM | POA: Diagnosis not present

## 2016-05-20 DIAGNOSIS — M1990 Unspecified osteoarthritis, unspecified site: Secondary | ICD-10-CM | POA: Insufficient documentation

## 2016-05-20 DIAGNOSIS — F419 Anxiety disorder, unspecified: Secondary | ICD-10-CM | POA: Insufficient documentation

## 2016-05-20 DIAGNOSIS — E782 Mixed hyperlipidemia: Secondary | ICD-10-CM | POA: Diagnosis not present

## 2016-05-20 DIAGNOSIS — M62830 Muscle spasm of back: Secondary | ICD-10-CM | POA: Diagnosis not present

## 2016-05-20 DIAGNOSIS — E785 Hyperlipidemia, unspecified: Secondary | ICD-10-CM | POA: Insufficient documentation

## 2016-05-20 DIAGNOSIS — K219 Gastro-esophageal reflux disease without esophagitis: Secondary | ICD-10-CM | POA: Insufficient documentation

## 2016-05-20 DIAGNOSIS — Z79890 Hormone replacement therapy (postmenopausal): Secondary | ICD-10-CM | POA: Insufficient documentation

## 2016-05-20 DIAGNOSIS — R7309 Other abnormal glucose: Secondary | ICD-10-CM | POA: Diagnosis not present

## 2016-05-20 LAB — CBC WITH DIFFERENTIAL/PLATELET
Basophils Absolute: 54 cells/uL (ref 0–200)
Basophils Relative: 1 %
EOS PCT: 3 %
Eosinophils Absolute: 162 cells/uL (ref 15–500)
HEMATOCRIT: 43.9 % (ref 35.0–45.0)
Hemoglobin: 15 g/dL (ref 11.7–15.5)
LYMPHS PCT: 34 %
Lymphs Abs: 1836 cells/uL (ref 850–3900)
MCH: 30.5 pg (ref 27.0–33.0)
MCHC: 34.2 g/dL (ref 32.0–36.0)
MCV: 89.2 fL (ref 80.0–100.0)
MONOS PCT: 11 %
MPV: 10.8 fL (ref 7.5–12.5)
Monocytes Absolute: 594 cells/uL (ref 200–950)
NEUTROS PCT: 51 %
Neutro Abs: 2754 cells/uL (ref 1500–7800)
PLATELETS: 209 10*3/uL (ref 140–400)
RBC: 4.92 MIL/uL (ref 3.80–5.10)
RDW: 14.8 % (ref 11.0–15.0)
WBC: 5.4 10*3/uL (ref 3.8–10.8)

## 2016-05-20 MED ORDER — BACLOFEN 10 MG PO TABS: 10.0000 mg | ORAL_TABLET | Freq: Three times a day (TID) | ORAL | 0 refills | Status: DC

## 2016-05-20 NOTE — Progress Notes (Signed)
Name: [name]   MRN: [id number]    DOB: 1945/11/14   Date:05/20/2016       Progress Note  Subjective  Chief Complaint  Chief Complaint  Patient presents with  . Back Pain  . Hyperlipidemia    HPI  Back pain: she states she developed acute left lower back that was radiating to left lateral hip. She states symptoms started after she worked in her yard, followed by carrying for her husband that had knee replacement surgery. She went to [place] thinking it was secondary to problems with her left hip replacement and was told likely muscular, she was given NSAID's and Tramadol that did not help with pai, so she discontinued. Pain is improving, no longer constant, now it is more aching and worse when on lateral decubitus and when shifting positions. She denies burning or tingling. She denies bowel or bladder incontinence.  HTN she is taking medication and denies side effects. No chest pain or palpitation  Anxiety: seldom takes Valium, she usually takes one Diazepam at most once a week. She states symptoms are intermittent, and she takes it and goes to sleep  GERD: she has been taking Omeprazole daily, discussed risk of long term use of PPI, she is willing to try weaning self off and alternate with H2  Blocker.  Obesity: weight has been stable, she states she gains and looses.   Insomnia: used to take Ambien prn, but has not been taking it lately, so we will stop medication, continue Melatonin.   Hot Flashes: she has been unable to stop HRT in the past, has severe hot flashes and night sweats when she tries to stop, but willing to try again, advised to decrease only during Fall and if needed add SSRI.   Patient Active Problem List   Diagnosis Date Noted  . Bradycardia 05/20/2016  . Current long-term use of postmenopausal hormone replacement therapy 05/20/2016  . GERD (gastroesophageal reflux disease) 05/20/2016  . Hyperlipidemia, unspecified 05/20/2016  . Hypertension 05/20/2016  .  Insomnia 05/20/2016  . Obesity (BMI 30-39.9) 05/20/2016  . Arthritis 05/20/2016  . Anxiety 05/20/2016  . Rosacea 05/20/2016  . History of hip replacement, total, 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    Past Surgical History:  Procedure Laterality Date  . CERVICAL CONE BIOPSY    . COLONOSCOPY  10/2010  . COLONOSCOPY  2004  . endoscopic carpal tunn Bilateral   . HALLUX VALGUS CORRECTION Right 2005  . HYSTERECTOMY ABDOMINAL WITH SALPINGECTOMY  1981  . INCISION TENDON SHEATH HAND Right   . JOINT REPLACEMENT Right 11/21/2010   knee  . JOINT REPLACEMENT Left 11/04/2011   knee  . JOINT REPLACEMENT Left 04/27/2012   hip   . KNEE ARTHROSCOPY Right   . KNEE ARTHROSCOPY Left 12/18/2011    Family History  Problem Relation Age of Onset  . Alzheimer's disease Mother 66  . Osteoporosis Mother   . Stroke Father 42  . Arthritis Sister   . Basal cell carcinoma Sister   . Macular degeneration Other     Social History   Social History  . Marital status: Married    Spouse name: N/A  . Number of children: 2  . Years of education: N/A   Occupational History  . Not on file.   Social History Main Topics  . Smoking status: Never Smoker  . Smokeless tobacco: Never Used  . Alcohol use 0.6 oz/week    1 Glasses of wine per week  . Drug use: No  . Sexual  activity: No   Other Topics Concern  . Not on file   Social History Narrative   She moved to [place] to downsize, but decided to move back to [place] after 2 years.    Married.   One daughter lives in [place].    The other daughter in [place].     Current Outpatient Prescriptions:  .  atorvastatin (LIPITOR) 40 MG tablet, Take by mouth., Disp: , Rfl:  .  estropipate (OGEN) 0.75 MG tablet, Take by mouth., Disp: , Rfl:  .  Melatonin 5 MG TABS, Take by mouth., Disp: , Rfl:  .  baclofen (LIORESAL) 10 MG tablet, Take 1 tablet (10 mg total) by mouth 3 (three) times daily., Disp: 30 tablet, Rfl: 0 .  diazepam (VALIUM) 5 MG tablet, Take by mouth.,  Disp: , Rfl:  .  losartan-hydrochlorothiazide (HYZAAR) 50-12.5 MG tablet, , Disp: , Rfl:  .  Multiple Vitamin (MULTI-VITAMINS) TABS, Take by mouth., Disp: , Rfl:  .  naproxen sodium (ANAPROX) 220 MG tablet, Take by mouth., Disp: , Rfl:  .  omeprazole (PRILOSEC) 20 MG capsule, Take by mouth., Disp: , Rfl:   Allergies  Allergen Reactions  . Sulfa Antibiotics Nausea Only  . Penicillins Rash     ROS  Constitutional: Negative for fever or weight change.  Respiratory: Negative for cough and shortness of breath.   Cardiovascular: Negative for chest pain or palpitations.  Gastrointestinal: Negative for abdominal pain, no bowel changes.  Musculoskeletal: Positive  for gait problem and also joint swelling mostly on hands now.  Skin: Negative for rash.  Neurological: Negative for dizziness or headache.  No other specific complaints in a complete review of systems (except as listed in HPI above).  Objective  Vitals:   05/20/16 1336  BP: 132/72  Pulse: 69  Resp: 16  Temp: 98.4 F (36.9 C)  SpO2: 94%  Weight: 200 lb 9 oz (91 kg)  Height:  (1.626 m)    Body mass index is 34.43 kg/m.  Physical Exam  Constitutional: Patient appears well-developed and well-nourished. Obese  No distress.  HEENT: head atraumatic, normocephalic, pupils equal and reactive to light, neck supple, throat within normal limits Cardiovascular: Normal rate, regular rhythm and normal heart sounds.  No murmur heard. No BLE edema. Pulmonary/Chest: Effort normal and breath sounds normal. No respiratory distress. Abdominal: Soft.  There is no tenderness. Psychiatric: Patient has a normal mood and affect. behavior is normal. Judgment and thought content normal. Muscular Skeletal: pain with lateral bending and palpation of left thoracic and lumbar spine with palpation    PHQ2/9: Depression screen PHQ 2/9 05/20/2016  Decreased Interest 0  Down, Depressed, Hopeless 0  PHQ - 2 Score 0    Fall Risk: Fall Risk   05/20/2016  Falls in the past year? No    Functional Status Survey: Is the patient deaf or have difficulty hearing?: Yes (she will go back for hearing test) Does the patient have difficulty seeing, even when wearing glasses/contacts?: No (she has floaters) Does the patient have difficulty concentrating, remembering, or making decisions?: No Does the patient have difficulty walking or climbing stairs?: Yes (she has artificial knees and goes up and down stairs slowly) Does the patient have difficulty dressing or bathing?: No Does the patient have difficulty doing errands alone such as visiting a doctor's office or shopping?: No    Assessment & Plan  1. Back spasm  After yards work and carrying for husband that had a knee replacement, advised muscle  relaxer and she is going to see the chiropractor - baclofen (LIORESAL) 10 MG tablet; Take 1 tablet (10 mg total) by mouth 3 (three) times daily.  Dispense: 30 tablet; Refill: 0  2. Insomnia, unspecified type  She stopped taking Ambien  3. Mixed hyperlipidemia  Recheck level, stop Tricor, if needed we will add Lovaza - Lipid panel  4. Essential hypertension  At goal, recheck labs - CBC with Differential/Platelet - COMPLETE METABOLIC PANEL WITH GFR  5. Obesity, Class I, BMI 30.0-34.9 (see actual BMI)  Discussed with the patient the risk posed by an increased BMI. Discussed importance of portion control, calorie counting and at least 150 minutes of physical activity weekly. Avoid sweet beverages and drink more water. Eat at least 6 servings of fruit and vegetables daily  - Insulin, fasting - Hemoglobin A1c  6. Hot flashes  She will try to wean off after Summer.

## 2016-05-21 LAB — HEMOGLOBIN A1C
Hgb A1c MFr Bld: 5.4 % (ref <5.7)
MEAN PLASMA GLUCOSE: 108 mg/dL

## 2016-05-21 LAB — INSULIN, FASTING: Insulin fasting, serum: 11.7 u[IU]/mL (ref 2.0–19.6)

## 2016-05-22 ENCOUNTER — Other Ambulatory Visit: Payer: Self-pay | Admitting: Family Medicine

## 2016-05-22 ENCOUNTER — Encounter: Payer: Self-pay | Admitting: Family Medicine

## 2016-05-22 DIAGNOSIS — R748 Abnormal levels of other serum enzymes: Secondary | ICD-10-CM

## 2016-05-22 LAB — LIPID PANEL
CHOLESTEROL: 171 mg/dL (ref <200)
HDL: 78 mg/dL (ref >50)
LDL Cholesterol: 72 mg/dL (ref <100)
Total CHOL/HDL Ratio: 2.2 Ratio (ref <5.0)
Triglycerides: 107 mg/dL (ref <150)
VLDL: 21 mg/dL (ref <30)

## 2016-05-22 LAB — COMPLETE METABOLIC PANEL WITH GFR
AG Ratio: 1.6 Ratio (ref 1.0–2.5)
ALK PHOS: 102 U/L (ref 33–130)
ALT: 135 U/L — ABNORMAL HIGH (ref 6–29)
AST: 121 U/L — AB (ref 10–35)
Albumin: 4.4 g/dL (ref 3.6–5.1)
BUN / CREAT RATIO: 20.6 ratio (ref 6–22)
BUN: 21 mg/dL (ref 7–25)
CALCIUM: 9.6 mg/dL (ref 8.6–10.4)
CHLORIDE: 101 mmol/L (ref 98–110)
CO2: 26 mmol/L (ref 20–31)
Creat: 1.02 mg/dL — ABNORMAL HIGH (ref 0.50–0.99)
GFR, EST AFRICAN AMERICAN: 65 mL/min (ref >=60)
GFR, Est Non African American: 57 mL/min — ABNORMAL LOW (ref >=60)
Globulin: 2.8 g/dL (ref 1.9–3.7)
Glucose, Bld: 80 mg/dL (ref 65–99)
Potassium: 4.4 mmol/L (ref 3.5–5.3)
Sodium: 139 mmol/L (ref 135–146)
Total Bilirubin: 0.7 mg/dL (ref 0.2–1.2)
Total Protein: 7.2 g/dL (ref 6.1–8.1)

## 2016-05-23 ENCOUNTER — Encounter: Payer: Self-pay | Admitting: Family Medicine

## 2016-05-26 ENCOUNTER — Encounter: Payer: Self-pay | Admitting: Family Medicine

## 2016-05-30 ENCOUNTER — Other Ambulatory Visit: Payer: Self-pay

## 2016-05-30 ENCOUNTER — Other Ambulatory Visit: Payer: Self-pay | Admitting: Family Medicine

## 2016-05-30 DIAGNOSIS — R748 Abnormal levels of other serum enzymes: Secondary | ICD-10-CM

## 2016-05-30 DIAGNOSIS — M9903 Segmental and somatic dysfunction of lumbar region: Secondary | ICD-10-CM | POA: Diagnosis not present

## 2016-05-30 DIAGNOSIS — R945 Abnormal results of liver function studies: Secondary | ICD-10-CM | POA: Diagnosis not present

## 2016-05-30 DIAGNOSIS — M9905 Segmental and somatic dysfunction of pelvic region: Secondary | ICD-10-CM | POA: Diagnosis not present

## 2016-05-30 DIAGNOSIS — M5136 Other intervertebral disc degeneration, lumbar region: Secondary | ICD-10-CM | POA: Diagnosis not present

## 2016-05-30 DIAGNOSIS — M5417 Radiculopathy, lumbosacral region: Secondary | ICD-10-CM | POA: Diagnosis not present

## 2016-05-30 DIAGNOSIS — K760 Fatty (change of) liver, not elsewhere classified: Secondary | ICD-10-CM | POA: Insufficient documentation

## 2016-05-31 LAB — HEPATITIS PANEL, ACUTE
HCV Ab: NEGATIVE
HEP B S AG: NEGATIVE
Hep A IgM: NONREACTIVE
Hep B C IgM: NONREACTIVE

## 2016-06-01 ENCOUNTER — Other Ambulatory Visit: Payer: Self-pay | Admitting: Family Medicine

## 2016-06-01 DIAGNOSIS — R748 Abnormal levels of other serum enzymes: Secondary | ICD-10-CM

## 2016-06-02 ENCOUNTER — Telehealth: Payer: Self-pay

## 2016-06-02 ENCOUNTER — Other Ambulatory Visit: Payer: Self-pay | Admitting: Family Medicine

## 2016-06-02 NOTE — Telephone Encounter (Signed)
Pt requesting a refill on losartan-htcz 50-12.5mg . Please send a 90day supply to [facility].  [phone]

## 2016-06-02 NOTE — Telephone Encounter (Signed)
Contacted this patient to inform her that she has been scheduled to have her US on Friday, Jun 06, 2016 @ 8:45am, but patient stated that she will be out of town. She was not in position to take a number down so I called her back and asked her not to answer so I could leave her a message with Scheduling's number on it.

## 2016-06-03 MED ORDER — LOSARTAN POTASSIUM-HCTZ 50-12.5 MG PO TABS: 1.0000 | ORAL_TABLET | Freq: Every day | ORAL | 1 refills | Status: DC

## 2016-06-06 ENCOUNTER — Ambulatory Visit: Payer: Self-pay

## 2016-06-11 ENCOUNTER — Other Ambulatory Visit: Payer: Self-pay | Admitting: Family Medicine

## 2016-06-11 ENCOUNTER — Ambulatory Visit
Admission: RE | Admit: 2016-06-11 | Discharge: 2016-06-11 | Disposition: A | Discharge status: Home / Self Care | Payer: Medicare HMO | Source: Ambulatory Visit | Attending: Family Medicine | Admitting: Family Medicine

## 2016-06-11 ENCOUNTER — Encounter: Payer: Self-pay | Admitting: Family Medicine

## 2016-06-11 DIAGNOSIS — R748 Abnormal levels of other serum enzymes: Secondary | ICD-10-CM | POA: Insufficient documentation

## 2016-06-11 DIAGNOSIS — K760 Fatty (change of) liver, not elsewhere classified: Secondary | ICD-10-CM

## 2016-06-11 DIAGNOSIS — R932 Abnormal findings on diagnostic imaging of liver and biliary tract: Secondary | ICD-10-CM | POA: Insufficient documentation

## 2016-06-11 DIAGNOSIS — R945 Abnormal results of liver function studies: Secondary | ICD-10-CM | POA: Diagnosis not present

## 2016-07-08 DIAGNOSIS — H5213 Myopia, bilateral: Secondary | ICD-10-CM | POA: Diagnosis not present

## 2016-07-14 ENCOUNTER — Encounter: Payer: Self-pay | Admitting: Gastroenterology

## 2016-07-14 ENCOUNTER — Ambulatory Visit (INDEPENDENT_AMBULATORY_CARE_PROVIDER_SITE_OTHER): Payer: Medicare HMO | Admitting: Gastroenterology

## 2016-07-14 VITALS — BP 148/73 | HR 73 | Temp 98.2°F | Ht 64.0 in | Wt 195.0 lb

## 2016-07-14 DIAGNOSIS — K760 Fatty (change of) liver, not elsewhere classified: Secondary | ICD-10-CM | POA: Diagnosis not present

## 2016-07-14 DIAGNOSIS — H5213 Myopia, bilateral: Secondary | ICD-10-CM | POA: Diagnosis not present

## 2016-07-14 NOTE — Patient Instructions (Signed)
Life-style modification to include routine physical activity of at least 20-30 minutes daily as well as dietary modification was discussed. Specifically a Mediterranean style diet which is composed of fish, white meat, fresh fruit, vegetable, whole grains, and legumes was recommended. Foods enriched with excess refined sugars, such as high fructose corn syrup, sweetened beveraages and hydrogenated fats ( i,e. the "Western Diet") should be avoided. A higher protein, lower carbohydrate, lower fat diet is ideal in decreasing excess hydrogenated fats and complex sugars. Coffee consumption has been proven to be beneficial in improving liver injury. Routine physical activity which results in 3-5% weight loss improves insulin resistance and hepatic steatosis. Weight loss of 5-10% may improve nonalcoholic steatohepatisis or even hepatic fibrosis. Both diet and exercise are the foundation of treatment for patients with NAFLD.

## 2016-07-15 LAB — ANA: Anti Nuclear Antibody(ANA): NEGATIVE

## 2016-07-15 LAB — FERRITIN: FERRITIN: 102 ng/mL (ref 15–150)

## 2016-07-15 LAB — ANTI-SMOOTH MUSCLE ANTIBODY, IGG: Smooth Muscle Ab: 29 Units — ABNORMAL HIGH (ref 0–19)

## 2016-07-15 LAB — HEPATIC FUNCTION PANEL
ALK PHOS: 91 IU/L (ref 39–117)
ALT: 40 IU/L — AB (ref 0–32)
AST: 35 IU/L (ref 0–40)
Albumin: 4.8 g/dL (ref 3.6–4.8)
BILIRUBIN TOTAL: 0.6 mg/dL (ref 0.0–1.2)
BILIRUBIN, DIRECT: 0.2 mg/dL (ref 0.00–0.40)
Total Protein: 7.5 g/dL (ref 6.0–8.5)

## 2016-07-15 LAB — IRON AND TIBC
Iron Saturation: 20 % (ref 15–55)
Iron: 72 ug/dL (ref 27–139)
TIBC: 361 ug/dL (ref 250–450)
UIBC: 289 ug/dL (ref 118–369)

## 2016-07-15 LAB — CERULOPLASMIN: CERULOPLASMIN: 24.6 mg/dL (ref 19.0–39.0)

## 2016-07-15 LAB — MITOCHONDRIAL ANTIBODIES: MITOCHONDRIAL AB: 8.9 U (ref 0.0–20.0)

## 2016-07-15 LAB — HEPATITIS B SURFACE ANTIBODY,QUALITATIVE: Hep B Surface Ab, Qual: REACTIVE

## 2016-07-15 LAB — HEPATITIS A ANTIBODY, TOTAL: HEP A TOTAL AB: NEGATIVE

## 2016-07-15 LAB — ALPHA-1-ANTITRYPSIN: A-1 Antitrypsin: 125 mg/dL (ref 90–200)

## 2016-07-15 NOTE — Progress Notes (Signed)
Gastroenterology Consultation  Referring Provider:     Alba Cory, MD Primary Care Physician:  Alba Cory, MD Primary Gastroenterologist:  Dr. Servando Snare     Reason for Consultation:     Fatty liver        HPI:   Kari Lee is a 71 y.o. y/o female referred for consultation & management of Fatty liver by Dr. Carlynn Purl, Danna Hefty, MD.  This patient comes today with a finding of abnormal liver enzymes.  The patient's AST was 121 with the ALT of 135.  The patient's alkaline phosphatase was normal.  The patient had an ultrasound that showed her to have increased echogenicity of the liver.  The patient states that she has stopped all of her medications including her PPI with resulting heartburn and rebound.  She denies any nausea vomiting fevers or chills.  The patient has been trying to lose weight.  There is no report of any unexplained weight loss.  The patient denies any black stools or bloody stools.  There has been no report of any abdominal pain associated with her abnormal liver enzymes.  The patient denies being told that she had abnormal liver enzymes in the past.  She has been actively trying to decrease all the medications she takes.  Past Medical History:  Diagnosis Date  . Anxiety   . Arthritis   . Bradycardia   . Bronchitis   . GERD (gastroesophageal reflux disease)   . History of positive PPD 2009  . History of positive PPD 2009   INH through health dept CXR's needed for clearance  . Hyperlipidemia   . Hypertension   . Insomnia   . Obesity   . Rosacea     Past Surgical History:  Procedure Laterality Date  . CERVICAL CONE BIOPSY    . COLONOSCOPY  10/2010  . COLONOSCOPY  2004  . endoscopic carpal tunn Bilateral   . HALLUX VALGUS CORRECTION Right 2005  . HYSTERECTOMY ABDOMINAL WITH SALPINGECTOMY  1981  . INCISION TENDON SHEATH HAND Right   . JOINT REPLACEMENT Right 11/21/2010   knee  . JOINT REPLACEMENT Left 11/04/2011   knee  . JOINT REPLACEMENT Left  04/27/2012   hip   . KNEE ARTHROSCOPY Right   . KNEE ARTHROSCOPY Left 12/18/2011    Prior to Admission medications   Medication Sig Start Date End Date Taking? Authorizing Provider  atorvastatin (LIPITOR) 40 MG tablet Take by mouth. 03/19/16  Yes [provider]  diazepam (VALIUM) 5 MG tablet Take by mouth.   Yes [provider]  losartan-hydrochlorothiazide (HYZAAR) 50-12.5 MG tablet Take 1 tablet by mouth daily. 06/03/16  Yes Alba Cory, MD  Multiple Vitamin (MULTI-VITAMINS) TABS Take by mouth.   Yes [provider]  omeprazole (PRILOSEC) 20 MG capsule Take by mouth.   Yes [provider]  baclofen (LIORESAL) 10 MG tablet Take 1 tablet (10 mg total) by mouth 3 (three) times daily. Patient not taking: Reported on 07/14/2016 05/20/16   Alba Cory, MD  estropipate (OGEN) 0.75 MG tablet Take by mouth. 04/08/16   [provider]  Melatonin 5 MG TABS Take by mouth.    [provider]  naproxen sodium (ANAPROX) 220 MG tablet Take by mouth.    [provider]    Family History  Problem Relation Age of Onset  . Alzheimer's disease Mother 9  . Osteoporosis Mother   . Stroke Father 20  . Arthritis Sister   . Basal cell carcinoma Sister   .  Macular degeneration Other      Social History  Substance Use Topics  . Smoking status: Never Smoker  . Smokeless tobacco: Never Used  . Alcohol use 0.6 oz/week    1 Glasses of wine per week    Allergies as of 07/14/2016 - Review Complete 07/14/2016  Allergen Reaction Noted  . Sulfa antibiotics Nausea Only 05/20/2016  . Penicillins Rash 05/20/2016    Review of Systems:    All systems reviewed and negative except where noted in HPI.   Physical Exam:  BP (!) 148/73   Pulse 73   Temp 98.2 F (36.8 C) (Oral)   Ht 5\' 4"  (1.626 m)   Wt 195 lb (88.5 kg)   BMI 33.47 kg/m  No LMP recorded. Patient has had a hysterectomy. Psych:  Alert and cooperative. Normal mood and  affect. General:   Alert,  Well-developed, well-nourished, pleasant and cooperative in NAD Head:  Normocephalic and atraumatic. Eyes:  Sclera clear, no icterus.   Conjunctiva pink. Ears:  Normal auditory acuity. Nose:  No deformity, discharge, or lesions. Mouth:  No deformity or lesions,oropharynx pink & moist. Neck:  Supple; no masses or thyromegaly. Lungs:  Respirations even and unlabored.  Clear throughout to auscultation.   No wheezes, crackles, or rhonchi. No acute distress. Heart:  Regular rate and rhythm; no murmurs, clicks, rubs, or gallops. Abdomen:  Normal bowel sounds.  No bruits.  Soft, non-tender and non-distended without masses, hepatosplenomegaly or hernias noted.  No guarding or rebound tenderness.  Negative Carnett sign.   Rectal:  Deferred.  Msk:  Symmetrical without gross deformities.  Good, equal movement & strength bilaterally. Pulses:  Normal pulses noted. Extremities:  No clubbing or edema.  No cyanosis. Neurologic:  Alert and oriented x3;  grossly normal neurologically. Skin:  Intact without significant lesions or rashes.  No jaundice. Lymph Nodes:  No significant cervical adenopathy. Psych:  Alert and cooperative. Normal mood and affect.  Imaging Studies: No results found.  Assessment and Plan:   Kari Lee is a 71 y.o. y/o female who comes in with a abnormal liver enzyme study back in April that showed her to have an elevated AST and ALT.  The AST was 121 and the ALT was 135.  The rest of the liver enzymes are normal.  The patient will have her blood Center for other possible causes of abnormal liver enzymes.  The patient has also lost weight so we will recheck her liver enzymes.  The patient has been told the risks and benefits of a PPI including the risk of strictures and Barrett's esophagus with continued heartburn.  The patient has been encouraged to stay on a PPI if her symptoms are frequent and bothersome.  The patient will be notified with her lab  results.  Midge Miniumarren Wilfrido Luedke, MD. Clementeen GrahamFACG   Note: This dictation was prepared with Dragon dictation along with smaller phrase technology. Any transcriptional errors that result from this process are unintentional.

## 2016-07-17 ENCOUNTER — Telehealth: Payer: Self-pay

## 2016-07-17 NOTE — Telephone Encounter (Signed)
-----   Message from Midge Miniumarren Wohl, MD sent at 07/15/2016  8:50 PM EDT ----- The patient know that all of her labs came back and did not show any other cause for her elevated liver enzymes.  The patient's liver enzymes that were showing an AST of 121 came down to normal at 35 and her ALT which was abnormal at 135 came down to 40 which is only slightly above the normal of 32.  She should continue doing what she is doing to lose weight and have repeat liver enzymes in 3 months.

## 2016-07-17 NOTE — Telephone Encounter (Signed)
Pt notified of lab results

## 2016-08-19 ENCOUNTER — Ambulatory Visit (INDEPENDENT_AMBULATORY_CARE_PROVIDER_SITE_OTHER): Payer: Medicare HMO | Admitting: Family Medicine

## 2016-08-19 ENCOUNTER — Encounter: Payer: Self-pay | Admitting: Family Medicine

## 2016-08-19 VITALS — BP 132/78 | HR 71 | Temp 97.9°F | Resp 16 | Ht 64.0 in | Wt 197.5 lb

## 2016-08-19 DIAGNOSIS — E669 Obesity, unspecified: Secondary | ICD-10-CM

## 2016-08-19 DIAGNOSIS — R748 Abnormal levels of other serum enzymes: Secondary | ICD-10-CM | POA: Diagnosis not present

## 2016-08-19 DIAGNOSIS — Z1239 Encounter for other screening for malignant neoplasm of breast: Secondary | ICD-10-CM

## 2016-08-19 DIAGNOSIS — M19041 Primary osteoarthritis, right hand: Secondary | ICD-10-CM | POA: Diagnosis not present

## 2016-08-19 DIAGNOSIS — Z1231 Encounter for screening mammogram for malignant neoplasm of breast: Secondary | ICD-10-CM | POA: Diagnosis not present

## 2016-08-19 DIAGNOSIS — M19042 Primary osteoarthritis, left hand: Secondary | ICD-10-CM | POA: Diagnosis not present

## 2016-08-19 DIAGNOSIS — E782 Mixed hyperlipidemia: Secondary | ICD-10-CM

## 2016-08-19 DIAGNOSIS — I10 Essential (primary) hypertension: Secondary | ICD-10-CM | POA: Diagnosis not present

## 2016-08-19 DIAGNOSIS — R232 Flushing: Secondary | ICD-10-CM | POA: Diagnosis not present

## 2016-08-19 DIAGNOSIS — M1990 Unspecified osteoarthritis, unspecified site: Secondary | ICD-10-CM | POA: Insufficient documentation

## 2016-08-19 MED ORDER — ACETAMINOPHEN 500 MG PO TABS: 500.0000 mg | ORAL_TABLET | Freq: Two times a day (BID) | ORAL | 0 refills | Status: DC

## 2016-08-19 NOTE — Progress Notes (Signed)
Name: Kari Lee   MRN: [id number]    DOB: 1945/11/07   Date:08/19/2016       Progress Note  Subjective  Chief Complaint  Chief Complaint  Patient presents with  . Medication Refill    3 month F/U  . Hypertension    Denies any symptoms  . Gastroesophageal Reflux    Switched to Nexium every other day instead of Omperazole  . Insomnia    Doing well sleeping on average 6-7 hours nightly has stopped Melatonin  . Hyperlipidemia    HPI  HTN she is taking medication and denies side effects. No chest pain or palpitation. BP is at goal   Anxiety: seldom takes Valium, she usually takes one Diazepam at most once a week. She states symptoms are intermittent, and she takes it and goes to sleep  GERD: she has been taking Nexium every other day, she tried to wean self off but it caused symptoms to get worse, she is trying to take the least amount possible. Aware of possible long term side effects  Obesity: weight has been stable, she lost 8 lbs on a strict low carbohydrate diet to help with fatty liver, but gained 5 lbs back since seen by Dr. Lars Pinks and labs back to normal   Insomnia: she is off Ambien and Melatonin at this time, she still has some Ambien left at home, she is sleeping better right now.   Hot Flashes: she stopped taking HRT completely for 2 months, she tapered it off, but over the past 2 weeks , hot flashes returned so she is taking one every other day, and will try taking half every other day  Elevated liver enzymes: over 100's , seen by Dr. Lars Pinks, US showed fatty liver, changed diet and lost weight, liver enzymes normalized but now she is not as strict with her diet  OA: hands, pain is better, taking Tylenol and stopped Naproxen  Dyslipidemia: taking medication and denies side effects, no myalgia, chest pain    Patient Active Problem List   Diagnosis Date Noted  . Osteoarthritis of both hands 08/19/2016  . Elevated liver enzymes 05/30/2016  . Bradycardia  05/20/2016  . Current long-term use of postmenopausal hormone replacement therapy 05/20/2016  . GERD (gastroesophageal reflux disease) 05/20/2016  . Hyperlipidemia, unspecified 05/20/2016  . Hypertension 05/20/2016  . Insomnia 05/20/2016  . Obesity (BMI 30-39.9) 05/20/2016  . Arthritis 05/20/2016  . Anxiety 05/20/2016  . Rosacea 05/20/2016  . History of hip replacement, total, 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    Past Surgical History:  Procedure Laterality Date  . CERVICAL CONE BIOPSY    . COLONOSCOPY  10/2010  . COLONOSCOPY  2004  . endoscopic carpal tunn Bilateral   . HALLUX VALGUS CORRECTION Right 2005  . HYSTERECTOMY ABDOMINAL WITH SALPINGECTOMY  1981  . INCISION TENDON SHEATH HAND Right   . JOINT REPLACEMENT Right 11/21/2010   knee  . JOINT REPLACEMENT Left 11/04/2011   knee  . JOINT REPLACEMENT Left 04/27/2012   hip   . KNEE ARTHROSCOPY Right   . KNEE ARTHROSCOPY Left 12/18/2011    Family History  Problem Relation Age of Onset  . Alzheimer's disease Mother 29  . Osteoporosis Mother   . Stroke Father 16  . Arthritis Sister   . Basal cell carcinoma Sister   . Macular degeneration Other     Social History   Social History  . Marital status: Married    Spouse name: N/A  . Number of children: 2  .  Years of education: N/A   Occupational History  . Not on file.   Social History Main Topics  . Smoking status: Never Smoker  . Smokeless tobacco: Never Used  . Alcohol use 0.6 oz/week    1 Glasses of wine per week  . Drug use: No  . Sexual activity: No   Other Topics Concern  . Not on file   Social History Narrative   She moved to [place] to downsize, but decided to move back to [place] after 2 years.    Married.   One daughter lives in [place].    The other daughter in [place].     Current Outpatient Prescriptions:  .  atorvastatin (LIPITOR) 40 MG tablet, Take by mouth., Disp: , Rfl:  .  diazepam (VALIUM) 5 MG tablet, Take by mouth., Disp: , Rfl:  .   esomeprazole (NEXIUM) 20 MG packet, Take 20 mg by mouth every other day., Disp: , Rfl:  .  estropipate (OGEN) 0.75 MG tablet, Take 0.75 mg by mouth every other day. , Disp: , Rfl:  .  losartan-hydrochlorothiazide (HYZAAR) 50-12.5 MG tablet, Take 1 tablet by mouth daily., Disp: 90 tablet, Rfl: 1 .  Multiple Vitamin (MULTI-VITAMINS) TABS, Take by mouth., Disp: , Rfl:  .  acetaminophen (TYLENOL) 500 MG tablet, Take 1 tablet (500 mg total) by mouth 2 (two) times daily., Disp: 60 tablet, Rfl: 0 .  Melatonin 5 MG TABS, Take by mouth., Disp: , Rfl:   Allergies  Allergen Reactions  . Sulfa Antibiotics Nausea Only  . Penicillins Rash     ROS  Constitutional: Negative for fever or significant weight change.  Respiratory: Negative for cough and shortness of breath.   Cardiovascular: Negative for chest pain or palpitations.  Gastrointestinal: Negative for abdominal pain, no bowel changes.  Musculoskeletal: Negative for gait problem,  joint deformities both hands Skin: Negative for rash.  Neurological: Negative for dizziness or headache.  No other specific complaints in a complete review of systems (except as listed in HPI above).  Objective  Vitals:   08/19/16 1017  BP: 132/78  Pulse: 71  Resp: 16  Temp: 97.9 F (36.6 C)  TempSrc: Oral  SpO2: 97%  Weight: 197 lb 8 oz (89.6 kg)  Height: 5\' 4"  (1.626 m)    Body mass index is 33.9 kg/m.  Physical Exam  Constitutional: Patient appears well-developed and well-nourished. Obese  No distress.  HEENT: head atraumatic, normocephalic, pupils equal and reactive to light, neck supple, throat within normal limits Cardiovascular: Normal rate, regular rhythm and normal heart sounds.  No murmur heard. No BLE edema. Pulmonary/Chest: Effort normal and breath sounds normal. No respiratory distress. Abdominal: Soft.  There is no tenderness. Psychiatric: Patient has a normal mood and affect. behavior is normal. Judgment and thought content  normal. Muscular Skeletal: deformities on both hands, DIP and PIP, and unable to make a complete fist  Recent Results (from the past 2160 hour(s))  Hepatitis, Acute     Status: None   Collection Time: 05/30/16  2:30 PM  Result Value Ref Range   Hepatitis B Surface Ag NEGATIVE NEGATIVE   HCV Ab NEGATIVE NEGATIVE   Hep B C IgM NON REACTIVE NON REACTIVE    Comment: High levels of Hepatitis B Core IgM antibody are detectable during the acute stage of Hepatitis B. This antibody is used to differentiate current from past HBV infection.      Hep A IgM NON REACTIVE NON REACTIVE    Comment:  Effective December 12, 2013, Hepatitis Acute Panel (test code 1610922940) will be revised to automatically reflex to the Hepatitis C Viral RNA, Quantitative, Real-Time PCR assay if the Hepatitis C antibody screening result is Reactive. This action is being taken to ensure that the CDC/USPSTF recommended HCV diagnostic algorithm with the appropriate test reflex needed for accurate interpretation is followed.     Ceruloplasmin     Status: None   Collection Time: 07/14/16  4:54 PM  Result Value Ref Range   Ceruloplasmin 24.6 19.0 - 39.0 mg/dL  Mitochondrial antibodies     Status: None   Collection Time: 07/14/16  4:54 PM  Result Value Ref Range   Mitochondrial Ab 8.9 0.0 - 20.0 Units    Comment:                                 Negative    0.0 - 20.0                                 Equivocal  20.1 - 24.9                                 Positive         >24.9 Mitochondrial (M2) Antibodies are found in 90-96% of patients with primary biliary cirrhosis.   Hepatitis B surface antibody     Status: None   Collection Time: 07/14/16  4:54 PM  Result Value Ref Range   Hep B Surface Ab, Qual Reactive     Comment:               Non Reactive: Inconsistent with immunity,                             less than 10 mIU/mL               Reactive:     Consistent with immunity,                             greater than  9.9 mIU/mL   Anti-smooth muscle antibody, IgG     Status: Abnormal   Collection Time: 07/14/16  4:54 PM  Result Value Ref Range   Smooth Muscle Ab 29 (H) 0 - 19 Units    Comment:                  Negative                     0 - 19                  Weak positive               20 - 30                  Moderate to strong positive     >30  Actin Antibodies are found in 52-85% of patients with  autoimmune hepatitis or chronic active hepatitis and  in 22% of patients with primary biliary cirrhosis.   Alpha-1-antitrypsin     Status: None   Collection Time: 07/14/16  4:54 PM  Result Value Ref Range   A-1 Antitrypsin 125 90 -  200 mg/dL  Hepatitis A antibody, total     Status: None   Collection Time: 07/14/16  4:54 PM  Result Value Ref Range   Hep A Total Ab Negative Negative  ANA     Status: None   Collection Time: 07/14/16  4:54 PM  Result Value Ref Range   Anit Nuclear Antibody(ANA) Negative Negative  Iron and TIBC     Status: None   Collection Time: 07/14/16  4:54 PM  Result Value Ref Range   Total Iron Binding Capacity 361 250 - 450 ug/dL   UIBC 161 096 - 045 ug/dL   Iron 72 27 - 409 ug/dL   Iron Saturation 20 15 - 55 %  Ferritin     Status: None   Collection Time: 07/14/16  4:54 PM  Result Value Ref Range   Ferritin 102 15 - 150 ng/mL  Hepatic function panel     Status: Abnormal   Collection Time: 07/14/16  4:54 PM  Result Value Ref Range   Total Protein 7.5 6.0 - 8.5 g/dL   Albumin 4.8 3.6 - 4.8 g/dL   Bilirubin Total 0.6 0.0 - 1.2 mg/dL   Bilirubin, Direct 8.11 0.00 - 0.40 mg/dL   Alkaline Phosphatase 91 39 - 117 IU/L   AST 35 0 - 40 IU/L   ALT 40 (H) 0 - 32 IU/L      PHQ2/9: Depression screen [facility] 2/9 08/19/2016 05/20/2016  Decreased Interest 0 0  Down, Depressed, Hopeless 0 0  PHQ - 2 Score 0 0     Fall Risk: Fall Risk  08/19/2016 05/20/2016  Falls in the past year? No No     Functional Status Survey: Is the patient deaf or have difficulty hearing?: Yes  (right earring trouble) Does the patient have difficulty seeing, even when wearing glasses/contacts?: No Does the patient have difficulty concentrating, remembering, or making decisions?: No Does the patient have difficulty walking or climbing stairs?: No Does the patient have difficulty dressing or bathing?: No Does the patient have difficulty doing errands alone such as visiting a doctor's office or shopping?: No    Assessment & Plan  1. Essential hypertension  Doing well, under control   2. Mixed hyperlipidemia  On statin therapy  3. Obesity, Class I, BMI 30.0-34.9 (see actual BMI)  She lost weight but since liver enzymes improved she resumed carbohydrates again   4. Hot flashes  She was off estrogen by resumed it every other day because of recurrence hot flashes  5. Elevated liver enzymes  Doing well, seen by Dr. Lars Pinks, work up negative, and levels improved with life style modification he will recheck it in a few months  6. Breast cancer screening  - MM Digital Screening; Future  7. Primary osteoarthritis of both hands  - acetaminophen (TYLENOL) 500 MG tablet; Take 1 tablet (500 mg total) by mouth 2 (two) times daily.  Dispense: 60 tablet; Refill: 0

## 2016-08-22 ENCOUNTER — Encounter: Payer: Self-pay | Admitting: Family Medicine

## 2016-08-22 ENCOUNTER — Ambulatory Visit
Admission: RE | Admit: 2016-08-22 | Discharge: 2016-08-22 | Disposition: A | Discharge status: Home / Self Care | Payer: Medicare HMO | Source: Ambulatory Visit | Attending: Family Medicine | Admitting: Family Medicine

## 2016-08-22 ENCOUNTER — Ambulatory Visit (INDEPENDENT_AMBULATORY_CARE_PROVIDER_SITE_OTHER): Payer: Medicare HMO | Admitting: Family Medicine

## 2016-08-22 ENCOUNTER — Other Ambulatory Visit: Payer: Self-pay | Admitting: Family Medicine

## 2016-08-22 VITALS — BP 132/84 | HR 60 | Temp 98.1°F | Resp 16 | Ht 64.0 in | Wt 200.0 lb

## 2016-08-22 DIAGNOSIS — R0781 Pleurodynia: Secondary | ICD-10-CM

## 2016-08-22 DIAGNOSIS — W19XXXA Unspecified fall, initial encounter: Secondary | ICD-10-CM

## 2016-08-22 DIAGNOSIS — S2231XA Fracture of one rib, right side, initial encounter for closed fracture: Secondary | ICD-10-CM

## 2016-08-22 DIAGNOSIS — S299XXA Unspecified injury of thorax, initial encounter: Secondary | ICD-10-CM | POA: Diagnosis not present

## 2016-08-22 MED ORDER — HYDROCODONE-ACETAMINOPHEN 5-325 MG PO TABS: 1.0000 | ORAL_TABLET | Freq: Three times a day (TID) | ORAL | 0 refills | Status: DC | PRN

## 2016-08-22 MED ORDER — DICLOFENAC SODIUM 1 % TD GEL: 4.0000 g | Freq: Four times a day (QID) | TRANSDERMAL | 0 refills | Status: DC

## 2016-08-22 NOTE — Progress Notes (Addendum)
Name: Kari ParkinsRebecca K Lee   MRN: [id number]    DOB: Mar 15, 1945   Date:08/22/2016       Progress Note  Subjective  Chief Complaint  Chief Complaint  Patient presents with  . Rib Injury    fell at [employer], hurts to breath  . Back Pain    HPI  Pt presents with c/o fall yesterday at [employer] - fell flat on her back, got up right away and did not loose consciousness, did not hit her head. Fell onto her back and to the RIGHT side. She felt okay at first, but had trouble throughout the night due to pain; no shortness of breath but having right sided rib pain.  No head injury. Having pain under RIGHT breast in the ribcage; no BUE or BLE pain or injury that she has noticed. Has been taking Tylenol and Naproxen without relief of pain in ribs - also splinting right ribcage.  Pt has history of osteoarthritis, has had bilateral hip replacements and LEFT hip replacements.   I have personally reviewed the [facility] recipient query regarding this patient and find that she is being prescribed Tramadol from Dr. Rosita KeaMenz. She states she has only a few left, but that they do not help her pain. I advised to only take this medication for the pain that Dr. Rosita KeaMenz was treating. We will await Rib Xray results before considering additional opioid pain relief medication.   Patient Active Problem List   Diagnosis Date Noted  . Osteoarthritis of both hands 08/19/2016  . Elevated liver enzymes 05/30/2016  . Bradycardia 05/20/2016  . Current long-term use of postmenopausal hormone replacement therapy 05/20/2016  . GERD (gastroesophageal reflux disease) 05/20/2016  . Hyperlipidemia, unspecified 05/20/2016  . Hypertension 05/20/2016  . Insomnia 05/20/2016  . Obesity (BMI 30-39.9) 05/20/2016  . Arthritis 05/20/2016  . Anxiety 05/20/2016  . Rosacea 05/20/2016  . History of hip replacement, total, 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    Social History  Substance Use Topics  . Smoking status: Never Smoker  . Smokeless tobacco: Never Used  . Alcohol  use 0.6 oz/week    1 Glasses of wine per week    Current Outpatient Prescriptions:  .  acetaminophen (TYLENOL) 500 MG tablet, Take 1 tablet (500 mg total) by mouth 2 (two) times daily., Disp: 60 tablet, Rfl: 0 .  atorvastatin (LIPITOR) 40 MG tablet, Take by mouth., Disp: , Rfl:  .  diazepam (VALIUM) 5 MG tablet, Take by mouth., Disp: , Rfl:  .  esomeprazole (NEXIUM) 20 MG packet, Take 20 mg by mouth every other day., Disp: , Rfl:  .  estropipate (OGEN) 0.75 MG tablet, Take 0.75 mg by mouth every other day. , Disp: , Rfl:  .  losartan-hydrochlorothiazide (HYZAAR) 50-12.5 MG tablet, Take 1 tablet by mouth daily., Disp: 90 tablet, Rfl: 1 .  Melatonin 5 MG TABS, Take by mouth., Disp: , Rfl:  .  Multiple Vitamin (MULTI-VITAMINS) TABS, Take by mouth., Disp: , Rfl:   Allergies  Allergen Reactions  . Sulfa Antibiotics Nausea Only  . Penicillins Rash    ROS  Constitutional: Negative for fever or weight change.  Respiratory: Negative for cough and shortness of breath.   Cardiovascular: Negative for chest pain or palpitations.  Gastrointestinal: Negative for abdominal pain, no bowel changes.  Musculoskeletal: Negative for gait problem or joint swelling.  Skin: Negative for rash.  Neurological: Negative for dizziness or headache.  No other specific complaints in a complete review of systems (except as listed in HPI above).  Objective  Vitals:   08/22/16 1000  BP: 132/84  Pulse: 60  Resp: 16  Temp: 98.1 F (36.7 C)  TempSrc: Oral  SpO2: 98%  Weight: 200 lb (90.7 kg)  Height: 5\' 4"  (1.626 m)   Body mass index is 34.33 kg/m.  Nursing Note and Vital Signs reviewed.  Physical Exam  Constitutional: Patient appears well-developed and well-nourished. Obese No distress.  HEENT: head atraumatic, normocephalic, pupils equal and reactive to light, EOM's intact, neck supple without lymphadenopathy, oropharynx pink and moist without exudate Cardiovascular: Normal rate, regular rhythm,  S1/S2 present.  No murmur or rub heard. No BLE edema. Pulmonary/Chest: Effort normal and breath sounds clear bilaterally. No respiratory distress or retractions. Tenderness on palpation beginning along the right mid-axillary line continuing just below the right breast to approximately the midclavicular line. No crepitus or indication for subcutaneous emphysema.  No bruising on chest. Abdominal: Soft and non-tender, bowel sounds present x4 quadrants. No bruising. Psychiatric: Patient has a normal mood and affect. behavior is normal. Judgment and thought content normal. Musculoskeletal: Normal range of motion, no joint effusions. No gross deformities. Spine is non-tender, bilateral hips are stable, no crepitus, and non-tender, gait is smooth. Neurological: he is alert and oriented to person, place, and time. No cranial nerve deficit. Coordination, balance, strength, speech and gait are normal.  Skin: Skin is warm and dry. No rash noted. No erythema. No bruising appreciated. Psychiatric: Patient has a normal mood and affect. behavior is normal. Judgment and thought content normal.   Recent Results (from the past 2160 hour(s))  Hepatitis, Acute     Status: None   Collection Time: 05/30/16  2:30 PM  Result Value Ref Range   Hepatitis B Surface Ag NEGATIVE NEGATIVE   HCV Ab NEGATIVE NEGATIVE   Hep B C IgM NON REACTIVE NON REACTIVE    Comment: High levels of Hepatitis B Core IgM antibody are detectable during the acute stage of Hepatitis B. This antibody is used to differentiate current from past HBV infection.      Hep A IgM NON REACTIVE NON REACTIVE    Comment:   Effective December 12, 2013, Hepatitis Acute Panel (test code 8295622940) will be revised to automatically reflex to the Hepatitis C Viral RNA, Quantitative, Real-Time PCR assay if the Hepatitis C antibody screening result is Reactive. This action is being taken to ensure that the CDC/USPSTF recommended HCV diagnostic algorithm with  the appropriate test reflex needed for accurate interpretation is followed.     Ceruloplasmin     Status: None   Collection Time: 07/14/16  4:54 PM  Result Value Ref Range   Ceruloplasmin 24.6 19.0 - 39.0 mg/dL  Mitochondrial antibodies     Status: None   Collection Time: 07/14/16  4:54 PM  Result Value Ref Range   Mitochondrial Ab 8.9 0.0 - 20.0 Units    Comment:                                 Negative    0.0 - 20.0                                 Equivocal  20.1 - 24.9  Positive         >24.9 Mitochondrial (M2) Antibodies are found in 90-96% of patients with primary biliary cirrhosis.   Hepatitis B surface antibody     Status: None   Collection Time: 07/14/16  4:54 PM  Result Value Ref Range   Hep B Surface Ab, Qual Reactive     Comment:               Non Reactive: Inconsistent with immunity,                             less than 10 mIU/mL               Reactive:     Consistent with immunity,                             greater than 9.9 mIU/mL   Anti-smooth muscle antibody, IgG     Status: Abnormal   Collection Time: 07/14/16  4:54 PM  Result Value Ref Range   Smooth Muscle Ab 29 (H) 0 - 19 Units    Comment:                  Negative                     0 - 19                  Weak positive               20 - 30                  Moderate to strong positive     >30  Actin Antibodies are found in 52-85% of patients with  autoimmune hepatitis or chronic active hepatitis and  in 22% of patients with primary biliary cirrhosis.   Alpha-1-antitrypsin     Status: None   Collection Time: 07/14/16  4:54 PM  Result Value Ref Range   A-1 Antitrypsin 125 90 - 200 mg/dL  Hepatitis A antibody, total     Status: None   Collection Time: 07/14/16  4:54 PM  Result Value Ref Range   Hep A Total Ab Negative Negative  ANA     Status: None   Collection Time: 07/14/16  4:54 PM  Result Value Ref Range   Anit Nuclear Antibody(ANA) Negative Negative  Iron and  TIBC     Status: None   Collection Time: 07/14/16  4:54 PM  Result Value Ref Range   Total Iron Binding Capacity 361 250 - 450 ug/dL   UIBC 161 096 - 045 ug/dL   Iron 72 27 - 409 ug/dL   Iron Saturation 20 15 - 55 %  Ferritin     Status: None   Collection Time: 07/14/16  4:54 PM  Result Value Ref Range   Ferritin 102 15 - 150 ng/mL  Hepatic function panel     Status: Abnormal   Collection Time: 07/14/16  4:54 PM  Result Value Ref Range   Total Protein 7.5 6.0 - 8.5 g/dL   Albumin 4.8 3.6 - 4.8 g/dL   Bilirubin Total 0.6 0.0 - 1.2 mg/dL   Bilirubin, Direct 8.11 0.00 - 0.40 mg/dL   Alkaline Phosphatase 91 39 - 117 IU/L   AST 35 0 - 40 IU/L   ALT 40 (H) 0 - 32 IU/L  Assessment & Plan  1. Rib pain on right side - DG Ribs Unilateral Right; Future - diclofenac sodium (VOLTAREN) 1 % GEL; Apply 4 g topically 4 (four) times daily.  Dispense: 100 g; Refill: 0  2. Fall, initial encounter - DG Ribs Unilateral Right; Future  -Red flags and when to present for emergency care or RTC including fever >101.81F, changes in  chest pain, shortness of breath, new/worsening/un-resolving symptoms, bruising or bleeding  reviewed with patient at time of visit. Follow up and care instructions discussed and provided in AVS.  I have reviewed this encounter including the documentation in this note and/or discussed this patient with the Deboraha Sprang, FNP, NP-C. I am certifying that I agree with the content of this note as supervising physician.  Alba Cory, MD [facility] 08/22/2016, 12:51 PM

## 2016-08-22 NOTE — Patient Instructions (Signed)
Rib Contusion A rib contusion is a deep bruise on your rib area. Contusions are the result of a blunt trauma that causes bleeding and injury to the tissues under the skin. A rib contusion may involve bruising of the ribs and of the skin and muscles in the area. The skin overlying the contusion may turn blue, purple, or yellow. Minor injuries will give you a painless contusion, but more severe contusions may stay painful and swollen for a few weeks. What are the causes? A contusion is usually caused by a blow, trauma, or direct force to an area of the body. This often occurs while playing contact sports. What are the signs or symptoms?  Swelling and redness of the injured area.  Discoloration of the injured area.  Tenderness and soreness of the injured area.  Pain with or without movement. How is this diagnosed? The diagnosis can be made by taking a medical history and performing a physical exam. An X-ray, CT scan, or MRI may be needed to determine if there were any associated injuries, such as broken bones (fractures) or internal injuries. How is this treated? Often, the best treatment for a rib contusion is rest. Icing or applying cold compresses to the injured area may help reduce swelling and inflammation. Deep breathing exercises may be recommended to reduce the risk of partial lung collapse and pneumonia. Over-the-counter or prescription medicines may also be recommended for pain control. Follow these instructions at home:  Apply ice to the injured area: ? Put ice in a plastic bag. ? Place a towel between your skin and the bag. ? Leave the ice on for 20 minutes, 2-3 times per day.  Take medicines only as directed by your health care provider.  Rest the injured area. Avoid strenuous activity and any activities or movements that cause pain. Be careful during activities and avoid bumping the injured area.  Perform deep-breathing exercises as directed by your health care provider.  Do  not lift anything that is heavier than 5 lb (2.3 kg) until your health care provider approves.  Do not use any tobacco products, including cigarettes, chewing tobacco, or electronic cigarettes. If you need help quitting, ask your health care provider. Contact a health care provider if:  You have increased bruising or swelling.  You have pain that is not controlled with treatment.  You have a fever. Get help right away if:  You have difficulty breathing or shortness of breath.  You develop a continual cough, or you cough up thick or bloody sputum.  You feel sick to your stomach (nauseous), you throw up (vomit), or you have abdominal pain. This information is not intended to replace advice given to you by your health care provider. Make sure you discuss any questions you have with your health care provider. Document Released: 10/08/2000 Document Revised: 06/21/2015 Document Reviewed: 10/25/2013 Elsevier Interactive Patient Education  2018 [employer]  Rib Contusion A rib contusion is a deep bruise on your rib area. Contusions are the result of a blunt trauma that causes bleeding and injury to the tissues under the skin. A rib contusion may involve bruising of the ribs and of the skin and muscles in the area. The skin overlying the contusion may turn blue, purple, or yellow. Minor injuries will give you a painless contusion, but more severe contusions may stay painful and swollen for a few weeks. What are the causes? A contusion is usually caused by a blow, trauma, or direct force to an area of  the body. This often occurs while playing contact sports. What are the signs or symptoms?  Swelling and redness of the injured area.  Discoloration of the injured area.  Tenderness and soreness of the injured area.  Pain with or without movement. How is this diagnosed? The diagnosis can be made by taking a medical history and performing a physical exam. An X-ray, CT scan, or MRI may be  needed to determine if there were any associated injuries, such as broken bones (fractures) or internal injuries. How is this treated? Often, the best treatment for a rib contusion is rest. Icing or applying cold compresses to the injured area may help reduce swelling and inflammation. Deep breathing exercises may be recommended to reduce the risk of partial lung collapse and pneumonia. Over-the-counter or prescription medicines may also be recommended for pain control. Follow these instructions at home:  Apply ice to the injured area: ? Put ice in a plastic bag. ? Place a towel between your skin and the bag. ? Leave the ice on for 20 minutes, 2-3 times per day.  Take medicines only as directed by your health care provider.  Rest the injured area. Avoid strenuous activity and any activities or movements that cause pain. Be careful during activities and avoid bumping the injured area.  Perform deep-breathing exercises as directed by your health care provider.  Do not lift anything that is heavier than 5 lb (2.3 kg) until your health care provider approves.  Do not use any tobacco products, including cigarettes, chewing tobacco, or electronic cigarettes. If you need help quitting, ask your health care provider. Contact a health care provider if:  You have increased bruising or swelling.  You have pain that is not controlled with treatment.  You have a fever. Get help right away if:  You have difficulty breathing or shortness of breath.  You develop a continual cough, or you cough up thick or bloody sputum.  You feel sick to your stomach (nauseous), you throw up (vomit), or you have abdominal pain. This information is not intended to replace advice given to you by your health care provider. Make sure you discuss any questions you have with your health care provider. Document Released: 10/08/2000 Document Revised: 06/21/2015 Document Reviewed: 10/25/2013 Elsevier Interactive Patient  Education  [employer]2018 [employer]

## 2016-09-01 ENCOUNTER — Other Ambulatory Visit: Payer: Self-pay | Admitting: Family Medicine

## 2016-09-01 ENCOUNTER — Encounter: Payer: Self-pay | Admitting: Family Medicine

## 2016-09-01 MED ORDER — ESTROPIPATE 0.75 MG PO TABS: 0.7500 mg | ORAL_TABLET | ORAL | 5 refills | Status: DC

## 2016-09-08 DIAGNOSIS — R69 Illness, unspecified: Secondary | ICD-10-CM | POA: Diagnosis not present

## 2016-09-09 ENCOUNTER — Encounter: Payer: Self-pay | Admitting: Family Medicine

## 2016-09-09 ENCOUNTER — Other Ambulatory Visit: Payer: Self-pay | Admitting: Family Medicine

## 2016-09-09 MED ORDER — ATORVASTATIN CALCIUM 40 MG PO TABS: 40.0000 mg | ORAL_TABLET | Freq: Every day | ORAL | 1 refills | Status: DC

## 2016-09-23 ENCOUNTER — Ambulatory Visit (INDEPENDENT_AMBULATORY_CARE_PROVIDER_SITE_OTHER): Payer: Medicare HMO

## 2016-09-23 ENCOUNTER — Ambulatory Visit
Admission: EM | Admit: 2016-09-23 | Discharge: 2016-09-23 | Disposition: A | Discharge status: Home / Self Care | Payer: Medicare HMO | Attending: Family Medicine | Admitting: Family Medicine

## 2016-09-23 DIAGNOSIS — R0789 Other chest pain: Secondary | ICD-10-CM

## 2016-09-23 DIAGNOSIS — L509 Urticaria, unspecified: Secondary | ICD-10-CM

## 2016-09-23 DIAGNOSIS — R079 Chest pain, unspecified: Secondary | ICD-10-CM | POA: Diagnosis not present

## 2016-09-23 DIAGNOSIS — R070 Pain in throat: Secondary | ICD-10-CM

## 2016-09-23 LAB — CBC WITH DIFFERENTIAL/PLATELET
BASOS PCT: 1 %
Basophils Absolute: 0 10*3/uL (ref 0–0.1)
EOS ABS: 0.1 10*3/uL (ref 0–0.7)
EOS PCT: 2 %
HCT: 45.2 % (ref 35.0–47.0)
HEMOGLOBIN: 15.6 g/dL (ref 12.0–16.0)
Lymphocytes Relative: 35 %
Lymphs Abs: 2.5 10*3/uL (ref 1.0–3.6)
MCH: 30.8 pg (ref 26.0–34.0)
MCHC: 34.6 g/dL (ref 32.0–36.0)
MCV: 89 fL (ref 80.0–100.0)
MONOS PCT: 8 %
Monocytes Absolute: 0.5 10*3/uL (ref 0.2–0.9)
NEUTROS PCT: 54 %
Neutro Abs: 4 10*3/uL (ref 1.4–6.5)
PLATELETS: 191 10*3/uL (ref 150–440)
RBC: 5.08 MIL/uL (ref 3.80–5.20)
RDW: 13.2 % (ref 11.5–14.5)
WBC: 7.3 10*3/uL (ref 3.6–11.0)

## 2016-09-23 LAB — BASIC METABOLIC PANEL
Anion gap: 9 (ref 5–15)
BUN: 19 mg/dL (ref 6–20)
CHLORIDE: 105 mmol/L (ref 101–111)
CO2: 23 mmol/L (ref 22–32)
CREATININE: 0.84 mg/dL (ref 0.44–1.00)
Calcium: 9.3 mg/dL (ref 8.9–10.3)
Glucose, Bld: 105 mg/dL — ABNORMAL HIGH (ref 65–99)
Potassium: 3.7 mmol/L (ref 3.5–5.1)
SODIUM: 137 mmol/L (ref 135–145)

## 2016-09-23 LAB — TROPONIN I

## 2016-09-23 MED ORDER — DIPHENHYDRAMINE HCL 25 MG PO CAPS
25.0000 mg | ORAL_CAPSULE | Freq: Once | ORAL | Status: AC
  Administered 2016-09-23: 25 mg via ORAL

## 2016-09-23 MED ORDER — FAMOTIDINE 20 MG PO TABS
20.0000 mg | ORAL_TABLET | Freq: Once | ORAL | Status: AC
  Administered 2016-09-23: 20 mg via ORAL

## 2016-09-23 MED ORDER — FAMOTIDINE 20 MG PO TABS: 20.0000 mg | ORAL_TABLET | Freq: Two times a day (BID) | ORAL | 0 refills | Status: DC

## 2016-09-23 MED ORDER — PREDNISONE 20 MG PO TABS: ORAL_TABLET | ORAL | 0 refills | Status: DC

## 2016-09-23 MED ORDER — EPINEPHRINE 0.3 MG/0.3ML IJ SOAJ: 0.3000 mg | INTRAMUSCULAR | 0 refills | Status: DC | PRN

## 2016-09-23 MED ORDER — METHYLPREDNISOLONE SODIUM SUCC 125 MG IJ SOLR
80.0000 mg | Freq: Once | INTRAMUSCULAR | Status: AC
  Administered 2016-09-23: 80 mg via INTRAMUSCULAR

## 2016-09-23 MED ORDER — METHYLPREDNISOLONE SODIUM SUCC 125 MG IJ SOLR: 60.0000 mg | Freq: Once | INTRAMUSCULAR | Status: DC

## 2016-09-23 NOTE — Discharge Instructions (Signed)
Take medication as prescribed. Rest. Drink plenty of fluids. Monitor for triggers.  Follow up with your primary care physician this week. Return to Urgent care as needed. As discussed, for any chest pain, shortness of breath, swelling, swelling sensation, return or worsening concerns then proceed directly to emergency room.

## 2016-09-23 NOTE — ED Provider Notes (Signed)
[facility] ____________________________________________  Time seen: Approximately 6:04 PM  I have reviewed the triage vital signs and the nursing notes.   HISTORY  Chief Complaint Nausea and Chest Pain  HPI Kari Lee is a 71 y.o. female presenting with husband at bedside for evaluation of itching, rash, chest pressure and throat discomfort. Patient states that approximately 1.5 hours ago she was sitting in her chair resting, and began to have what she described as throat discomfort stating it felt like it was swelling. Patient states that she then noticed she was having chest pressure midsternal, states was having some shortness of breath with the chest pressure. States that she then got up to get a Benadryl and noticed that she had a rash accompanying her generalized itching. States the rash was noticed to both of her arms. Patient states that she then tried to make herself vomit by sticking her finger down her throat, she thought that that may have made her feel better. She vomited minimal amount directly after this without any change in her symptoms. Patient again states she is very itchy. States that her chest discomfort and throat discomfort have fully resolved at this time. Patient states that she did only take one 25 mg Benadryl prior to arrival. No other medications taken prior to arrival. Denies any known changes in foods, medicines, lotions, detergents or other contacts. States approximately 45 minutes prior to symptom onset she did eat a milkshake from cookout, but states that this is nothing atypical for her.  Patient states that the itching and rash was present to both of her arms, chest, ears and neck. Reports chest discomfort and throat swelling sensation are fully resolved, and states that they resolved just as she was arriving to the urgent care. Denies any known allergies. States has not taken ACE inhibitor. Denies any recent injury or trauma. Denies any insect  bite, tick bite or tick attachment. Agent states that she cannot think of any trigger for her current complaints. 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 States she has had bradycardia for years, denies any other cardiac medical history. Not a smoker.Reports currently is feeling much better.   Denies palpitations, chest pain with deep breath, abdominal pain, dysuria, paresthesias, weakness, extremity pain, extremity swelling or rash. Denies recent sickness. Denies recent antibiotic use. Denies lip tongue or oral swelling.  Alba Cory, MD: PCP    Past Medical History:  Diagnosis Date  . Anxiety   . Arthritis   . Bradycardia   . Bronchitis   . GERD (gastroesophageal reflux disease)   . History of positive PPD 2009  . History of positive PPD 2009   INH through health dept CXR's needed for clearance  . Hyperlipidemia   . Hypertension   . Insomnia   . Obesity   . Rosacea    Reports bradycardia previously evaluated [facility] cardiology, and per patient unremarkable stress test and echo.  Patient Active Problem List   Diagnosis Date Noted  . Traumatic closed nondisplaced fracture of one rib of right side 08/22/2016  . Osteoarthritis of both hands 08/19/2016  . Elevated liver enzymes 05/30/2016  . Bradycardia 05/20/2016  . Current long-term use of postmenopausal hormone replacement therapy 05/20/2016  . GERD (gastroesophageal reflux disease) 05/20/2016  . Hyperlipidemia, unspecified 05/20/2016  . Hypertension 05/20/2016  . Insomnia 05/20/2016  . Obesity (BMI 30-39.9) 05/20/2016  . Arthritis 05/20/2016  . Anxiety 05/20/2016  . Rosacea 05/20/2016  .  History of hip replacement, total, 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    Past Surgical History:  Procedure Laterality Date  . CERVICAL CONE BIOPSY    . COLONOSCOPY  10/2010  . COLONOSCOPY  2004  . endoscopic carpal tunn Bilateral   . HALLUX VALGUS CORRECTION Right 2005    . HYSTERECTOMY ABDOMINAL WITH SALPINGECTOMY  1981  . INCISION TENDON SHEATH HAND Right   . JOINT REPLACEMENT Right 11/21/2010   knee  . JOINT REPLACEMENT Left 11/04/2011   knee  . JOINT REPLACEMENT Left 04/27/2012   hip   . KNEE ARTHROSCOPY Right   . KNEE ARTHROSCOPY Left 12/18/2011     No current facility-administered medications for this encounter.   Current Outpatient Prescriptions:  .  acetaminophen (TYLENOL) 500 MG tablet, Take 1 tablet (500 mg total) by mouth 2 (two) times daily., Disp: 60 tablet, Rfl: 0 .  atorvastatin (LIPITOR) 40 MG tablet, Take 1 tablet (40 mg total) by mouth daily at 6 PM., Disp: 90 tablet, Rfl: 1 .  diazepam (VALIUM) 5 MG tablet, Take by mouth., Disp: , Rfl:  .  esomeprazole (NEXIUM) 20 MG packet, Take 20 mg by mouth every other day., Disp: , Rfl:  .  estropipate (OGEN) 0.75 MG tablet, Take 1 tablet (0.75 mg total) by mouth every other day., Disp: 30 tablet, Rfl: 5 .  losartan-hydrochlorothiazide (HYZAAR) 50-12.5 MG tablet, Take 1 tablet by mouth daily., Disp: 90 tablet, Rfl: 1 .  Multiple Vitamin (MULTI-VITAMINS) TABS, Take by mouth., Disp: , Rfl:  .  diclofenac sodium (VOLTAREN) 1 % GEL, Apply 4 g topically 4 (four) times daily., Disp: 100 g, Rfl: 0 .  EPINEPHrine (EPIPEN 2-PAK) 0.3 mg/0.3 mL IJ SOAJ injection, Inject 0.3 mLs (0.3 mg total) into the muscle as needed (anaphylaxis). Then seek medical care, Disp: 1 Device, Rfl: 0 .  famotidine (PEPCID) 20 MG tablet, Take 1 tablet (20 mg total) by mouth 2 (two) times daily., Disp: 10 tablet, Rfl: 0 .  HYDROcodone-acetaminophen (NORCO) 5-325 MG tablet, Take 1 tablet by mouth every 8 (eight) hours as needed for moderate pain., Disp: 10 tablet, Rfl: 0 .  predniSONE (DELTASONE) 20 MG tablet, Take 40 mg orally daily for 3 days, then take 20 mg orally daily for 2 days., Disp: 8 tablet, Rfl: 0  Allergies Sulfa antibiotics and Penicillins  Family History  Problem Relation Age of Onset  . Alzheimer's disease  Mother 77  . Osteoporosis Mother   . Stroke Father 79  . Arthritis Sister   . Basal cell carcinoma Sister   . Macular degeneration Other   Brother: HTN  Social History Social History  Substance Use Topics  . Smoking status: Never Smoker  . Smokeless tobacco: Never Used  . Alcohol use 0.6 oz/week    1 Glasses of wine per week    Review of Systems Constitutional: No fever/chills Eyes: No visual changes. ENT: No sore throat. As above.  Cardiovascular: As above.  Respiratory: As above.  Gastrointestinal: No abdominal pain. As above. No diarrhea.  No constipation. Genitourinary: Negative for dysuria. Musculoskeletal: Negative for back pain. Skin: positive for rash.  ____________________________________________   PHYSICAL EXAM:  VITAL SIGNS: ED Triage Vitals  Enc Vitals Group     BP 09/23/16 1715 (!) 165/78     Pulse Rate 09/23/16 1715 61     Resp 09/23/16 1715 18     Temp 09/23/16 1715 97.7 F (36.5 C)     Temp Source 09/23/16 1715 Oral     SpO2 09/23/16 1715  98 %     Weight 09/23/16 1716 195 lb (88.5 kg)     Height 09/23/16 1716 5\' 4"  (1.626 m)     Head Circumference --      Peak Flow --      Pain Score 09/23/16 1716 3     Pain Loc --      Pain Edu? --      Excl. in GC? --     Constitutional: Alert and oriented. Well appearing and in no acute distress. Eyes: Conjunctivae are normal. PERRL.  ENT      Head: Normocephalic and atraumatic.      Ears: Nontender, no erythema, normal TMs.       Nose: No congestion/rhinnorhea.      Mouth/Throat: Mucous membranes are moist.Oropharynx non-erythematous. No tonsillar swelling. No lip, tongue or oral edema noted.  Neck: No stridor. Supple without meningismus. No neck edema noted.  Hematological/Lymphatic/Immunilogical: No cervical lymphadenopathy. Cardiovascular: Normal rate, regular rhythm. Grossly normal heart sounds.  Good peripheral circulation. Respiratory: Normal respiratory effort without tachypnea nor retractions.  Breath sounds are clear and equal bilaterally. No wheezes, rales, rhonchi. Gastrointestinal: Soft and nontender. Obese abdomen. Normal Bowel sounds. No CVA tenderness. Musculoskeletal:  Nontender with normal range of motion in all extremities. No midline cervical, thoracic or lumbar tenderness to palpation. Bilateral pedal pulses equal and easily palpated. Neurologic:  Normal speech and language. No gross focal neurologic deficits are appreciated. Speech is normal. No gait instability. Bilateral hand grips strong and equal. Steady gait. Sensation intact to bilateral face, upper and lower extremities.  Skin:  Skin is warm, dry and intact.  Except: Pruritic urticarial rash present to bilateral arms, scattered areas to chest and noted to posterior neck and ears and scalp, actively scratching in room, skin intact, no drainage, nontender, no edema noted. Psychiatric: Mood and affect are normal. Speech and behavior are normal. Patient exhibits appropriate insight and judgment   ___________________________________________   LABS (all labs ordered are listed, but only abnormal results are displayed)  Labs Reviewed  BASIC METABOLIC PANEL - Abnormal; Notable for the following:       Result Value   Glucose, Bld 105 (*)    All other components within normal limits  CBC WITH DIFFERENTIAL/PLATELET  TROPONIN I   ____________________________________________  EKG  ED ECG REPORT I, Renford Dills, the attending provider and Dr Judd Gaudier, personally viewed and interpreted this ECG.   Date: 09/23/2016  EKG Time: 1724  Rate: 55  Rhythm:sinus bradycardia  Axis: normal  Intervals:none  ST&T Change: none Compared and similar to previous ecg 10/27/11  ____________________________________________  RADIOLOGY  Dg Chest 2 View  Result Date: 09/23/2016 CLINICAL DATA:  Onset of nausea and itching of the arms at 1645 hours, hives all over body, mid chest pain radiating up to throat, chest pressure, history  hypertension, hyperlipidemia EXAM: CHEST  2 VIEW COMPARISON:  05/20/2007 FINDINGS: Upper normal heart size. Mediastinal contours and pulmonary vascularity normal. Lungs clear. No pleural effusion or pneumothorax. Bones demineralized. IMPRESSION: No acute abnormalities. Electronically Signed   By: Ulyses Southward M.D.   On: 09/23/2016 18:23   ____________________________________________   PROCEDURES Procedures    INITIAL IMPRESSION / ASSESSMENT AND PLAN / ED COURSE  Pertinent labs & imaging results that were available during my care of the patient were reviewed by me and considered in my medical decision making (see chart for details).  Well-appearing patient. No acute distress. Patient stating current symptoms only itching and rash. States previous chest pressure  and Sensation has fully resolved and not recurred. Patient denies known trigger. States all symptoms. Within a few minutes of each other. Patient with known bradycardia, declines any other cardiac history. In discussing with patient as well as clinical appearance, suspect acute allergic reaction. Discussed in detail with patient and spouse at bedside, cannot fully exclude cardiac etiology in urgent care at this time, patient verbalized understanding including rest, and request to proceed with resources currently available in urgent care. Patient states she does not want to go to ER at this time. Will evaluate chest x-ray, CBC, BMP, troponin. 80mg  IM solumedrol Medrol, 25 mg Benadryl and 20 mg of Pepcid orally. We'll continue to monitor.    Patient reexamined, patient reports continues to feel much better. States no current chest pain, shortness of breath, swelling sensations. States rash is even improved and itching has also improved states only mild itching at this time. Rash clinical appearance is also noticed fully improved. Labs reviewed, unremarkable. Chest x-ray negative. Will treat patient with home prednisone, Pepcid and Benadryl,  EpiPen given. Encourage allergy testing. Discussed very strict follow-up and return parameters. Discussed the patient proceed directly to emergency room for any recurrence of symptoms, chest pain, shortness of breath or other concerns.Discussed indication, risks and benefits of medications with patient.  Discussed follow up with Primary care physician this week. Discussed follow up and return parameters including no resolution or any worsening concerns. Patient verbalized understanding and agreed to plan. Discussed patient and plan of care with Dr Marrianne Mood, who agrees with plan.   ____________________________________________   FINAL CLINICAL IMPRESSION(S) / ED DIAGNOSES  Final diagnoses:  Urticaria  Chest discomfort  Throat discomfort     New Prescriptions   EPINEPHRINE (EPIPEN 2-PAK) 0.3 MG/0.3 ML IJ SOAJ INJECTION    Inject 0.3 mLs (0.3 mg total) into the muscle as needed (anaphylaxis). Then seek medical care   FAMOTIDINE (PEPCID) 20 MG TABLET    Take 1 tablet (20 mg total) by mouth 2 (two) times daily.   PREDNISONE (DELTASONE) 20 MG TABLET    Take 40 mg orally daily for 3 days, then take 20 mg orally daily for 2 days.    Note: This dictation was prepared with Dragon dictation along with smaller phrase technology. Any transcriptional errors that result from this process are unintentional.         Renford Dills, NP 09/23/16 1927

## 2016-09-23 NOTE — ED Triage Notes (Signed)
Patient states that at approx 445pm she was sitting in her chair and started feeling nausea with intense itching on her arms. Patient states that she got up to take a benadryl and noticed extreme mid chest pain that radiated up to her throat. Patient states that the pain now is is a pressure 3/10. Patient states that she did take 25mg  benadryl but did try to induce vomiting.

## 2016-10-15 ENCOUNTER — Telehealth: Payer: Self-pay

## 2016-10-15 ENCOUNTER — Other Ambulatory Visit: Payer: Self-pay

## 2016-10-15 DIAGNOSIS — R748 Abnormal levels of other serum enzymes: Secondary | ICD-10-CM

## 2016-10-15 NOTE — Telephone Encounter (Signed)
-----   Message from Rayann Heman, CMA sent at 07/17/2016 10:27 AM EDT ----- Pt needs 3 month LFT's done.

## 2016-10-15 NOTE — Telephone Encounter (Signed)
Spoke with the pt and advised her she is due for her 3 month repeat LFT's. Pt will go to [facility] to have this done.

## 2016-10-29 DIAGNOSIS — R748 Abnormal levels of other serum enzymes: Secondary | ICD-10-CM | POA: Diagnosis not present

## 2016-10-30 LAB — HEPATIC FUNCTION PANEL
ALK PHOS: 89 IU/L (ref 39–117)
ALT: 21 IU/L (ref 0–32)
AST: 25 IU/L (ref 0–40)
Albumin: 4.4 g/dL (ref 3.6–4.8)
Bilirubin Total: 0.7 mg/dL (ref 0.0–1.2)
Bilirubin, Direct: 0.2 mg/dL (ref 0.00–0.40)
TOTAL PROTEIN: 7 g/dL (ref 6.0–8.5)

## 2016-10-31 ENCOUNTER — Encounter: Payer: Self-pay | Admitting: Family Medicine

## 2016-11-04 ENCOUNTER — Telehealth: Payer: Self-pay

## 2016-11-04 NOTE — Telephone Encounter (Signed)
-----   Message from Midge Minium, MD sent at 11/03/2016  8:35 AM EDT ----- Let the patient know the liver enzymes have come back to normal.

## 2016-11-04 NOTE — Telephone Encounter (Signed)
Pt notified of lab results

## 2016-11-19 ENCOUNTER — Encounter: Payer: Self-pay | Admitting: Family Medicine

## 2016-11-19 ENCOUNTER — Ambulatory Visit: Payer: Medicare HMO

## 2016-11-19 ENCOUNTER — Ambulatory Visit (INDEPENDENT_AMBULATORY_CARE_PROVIDER_SITE_OTHER): Payer: Medicare HMO | Admitting: Family Medicine

## 2016-11-19 VITALS — BP 130/80 | HR 57 | Temp 98.3°F | Resp 14 | Ht 63.5 in | Wt 204.2 lb

## 2016-11-19 DIAGNOSIS — F320 Major depressive disorder, single episode, mild: Secondary | ICD-10-CM | POA: Insufficient documentation

## 2016-11-19 DIAGNOSIS — E782 Mixed hyperlipidemia: Secondary | ICD-10-CM | POA: Diagnosis not present

## 2016-11-19 DIAGNOSIS — E669 Obesity, unspecified: Secondary | ICD-10-CM

## 2016-11-19 DIAGNOSIS — Z0000 Encounter for general adult medical examination without abnormal findings: Secondary | ICD-10-CM

## 2016-11-19 DIAGNOSIS — R69 Illness, unspecified: Secondary | ICD-10-CM | POA: Diagnosis not present

## 2016-11-19 DIAGNOSIS — Z1239 Encounter for other screening for malignant neoplasm of breast: Secondary | ICD-10-CM

## 2016-11-19 DIAGNOSIS — Z23 Encounter for immunization: Secondary | ICD-10-CM

## 2016-11-19 DIAGNOSIS — F419 Anxiety disorder, unspecified: Secondary | ICD-10-CM | POA: Diagnosis not present

## 2016-11-19 DIAGNOSIS — H9193 Unspecified hearing loss, bilateral: Secondary | ICD-10-CM | POA: Diagnosis not present

## 2016-11-19 DIAGNOSIS — I10 Essential (primary) hypertension: Secondary | ICD-10-CM

## 2016-11-19 MED ORDER — DIAZEPAM 5 MG PO TABS: 5.0000 mg | ORAL_TABLET | Freq: Every day | ORAL | 0 refills | Status: DC | PRN

## 2016-11-19 NOTE — Progress Notes (Signed)
Patient: Kari Lee, Female    DOB: 1945/08/26, 71 y.o.   MRN: [id number]  Visit Date: 11/19/2016  Today's Provider: Loistine Chance, MD   Chief Complaint  Patient presents with  . Medicare Wellness    medicare  . Gastroesophageal Reflux    Subjective:    HPI Kari Lee is a 71 y.o. female who presents today for her Subsequent Annual Wellness Visit.  Patient/Caregiver input:  Worried about her mood  HTN she is taking medication and denies side effects. No chest pain or palpitation. BP is at goal today, she does not usually check at home  Anxiety: seldom takes Valium, she usually takes one Diazepam at most once a week. She states symptoms are intermittent, and she takes it and goes to sleep Major Depression: she states watching news has increase her stress level, also very upset about her 4 friends diagnosed with cancer recently and not doing well with aging. She states it has affected her sleep a little, but does not want to take medication. PHQ 9 reviewed  GERD: she has been taking Omeprazole now,  she tried to wean self off but it caused symptoms to get worse, she is trying to take the least amount possible. Aware of possible long term side effects  Obesity: weight has been stable, she lost 8 lbs on a strict low carbohydrate diet to help with fatty liver, but gained 8 lbs more since last visit  Insomnia: she is off Ambien and Melatonin at this time, she still has some Ambien left at home, she is sleeping better right now.   Hot Flashes: she tried weaning self off supplementation, but was not able to tolerate it, she is taking it every other day and is okay with medication.  Elevated liver enzymes: over 100's , seen by Dr. Durwin Reges, US showed fatty liver, changed diet and lost weight, liver enzymes normalized but now she is not as strict with her diet , last AST/ALT has really improved.   OA: hands, pain is better, taking Tylenol and stopped Naproxen  Dyslipidemia:  taking medication and denies side effects, no myalgia, chest pain   Review of Systems  Constitutional: Negative for fever or weight change.  Respiratory: Negative for cough and shortness of breath.   Cardiovascular: Negative for chest pain or palpitations.  Gastrointestinal: Negative for abdominal pain, no bowel changes.  Musculoskeletal: Negative for gait problem or joint swelling.  Skin: Negative for rash.  Neurological: Negative for dizziness or headache.  No other specific complaints in a complete review of systems (except as listed in HPI above).  Past Medical History:  Diagnosis Date  . Anxiety   . Arthritis   . Bradycardia   . Bronchitis   . GERD (gastroesophageal reflux disease)   . History of positive PPD 2009  . History of positive PPD 2009   INH through health dept CXR's needed for clearance  . Hyperlipidemia   . Hypertension   . Insomnia   . Obesity   . Rosacea     Past Surgical History:  Procedure Laterality Date  . CERVICAL CONE BIOPSY    . COLONOSCOPY  10/2010  . COLONOSCOPY  2004  . endoscopic carpal tunn Bilateral   . HALLUX VALGUS CORRECTION Right 2005  . HYSTERECTOMY ABDOMINAL WITH SALPINGECTOMY  1981  . INCISION TENDON SHEATH HAND Right   . JOINT REPLACEMENT Right 11/21/2010   knee  . JOINT REPLACEMENT Left 11/04/2011   knee  . JOINT REPLACEMENT Left 04/27/2012  hip   . KNEE ARTHROSCOPY Right   . KNEE ARTHROSCOPY Left 12/18/2011    Family History  Problem Relation Age of Onset  . Alzheimer's disease Mother 41  . Osteoporosis Mother   . Stroke Father 44  . Arthritis Sister   . Basal cell carcinoma Sister   . Macular degeneration Other     Social History   Social History  . Marital status: Married    Spouse name: N/A  . Number of children: 2  . Years of education: N/A   Occupational History  . Not on file.   Social History Main Topics  . Smoking status: Never Smoker  . Smokeless tobacco: Never Used  . Alcohol use 0.6 oz/week     1 Glasses of wine per week  . Drug use: No  . Sexual activity: No   Other Topics Concern  . Not on file   Social History Narrative   She moved to [place] to downsize, but decided to move back to [employer] after 2 years.    Married.   One daughter lives in [place].    The other daughter in [place].    Outpatient Encounter Prescriptions as of 11/19/2016  Medication Sig  . acetaminophen (TYLENOL) 500 MG tablet Take 1 tablet (500 mg total) by mouth 2 (two) times daily.  Marland Kitchen atorvastatin (LIPITOR) 40 MG tablet Take 1 tablet (40 mg total) by mouth daily at 6 PM.  . diazepam (VALIUM) 5 MG tablet Take 1 tablet (5 mg total) by mouth daily as needed for anxiety.  Marland Kitchen EPINEPHrine (EPIPEN 2-PAK) 0.3 mg/0.3 mL IJ SOAJ injection Inject 0.3 mLs (0.3 mg total) into the muscle as needed (anaphylaxis). Then seek medical care  . estropipate (OGEN) 0.75 MG tablet Take 1 tablet (0.75 mg total) by mouth every other day.  . losartan-hydrochlorothiazide (HYZAAR) 50-12.5 MG tablet Take 1 tablet by mouth daily.  . Multiple Vitamin (MULTI-VITAMINS) TABS Take by mouth.  Marland Kitchen omeprazole (PRILOSEC) 20 MG capsule Take 20 mg by mouth every other day.  . [DISCONTINUED] diazepam (VALIUM) 5 MG tablet Take by mouth.  . diclofenac sodium (VOLTAREN) 1 % GEL Apply 4 g topically 4 (four) times daily. (Patient not taking: Reported on 11/19/2016)  . [DISCONTINUED] esomeprazole (NEXIUM) 20 MG packet Take 20 mg by mouth every other day.  . [DISCONTINUED] famotidine (PEPCID) 20 MG tablet Take 1 tablet (20 mg total) by mouth 2 (two) times daily. (Patient not taking: Reported on 11/19/2016)  . [DISCONTINUED] HYDROcodone-acetaminophen (NORCO) 5-325 MG tablet Take 1 tablet by mouth every 8 (eight) hours as needed for moderate pain. (Patient not taking: Reported on 11/19/2016)  . [DISCONTINUED] predniSONE (DELTASONE) 20 MG tablet Take 40 mg orally daily for 3 days, then take 20 mg orally daily for 2 days. (Patient not taking: Reported  on 11/19/2016)   No facility-administered encounter medications on file as of 11/19/2016.     Allergies  Allergen Reactions  . Sulfa Antibiotics Nausea Only  . Penicillins Rash    Care Team Updated in EHR: Yes  Last Vision Exam: Summer 2018 - in [place]  Wears corrective lenses: Yes Last Dental Exam: Dr. Albesa Seen, Summer 2018 Last Hearing Exam: she has noticed decrease in hearing, had a test many years ago  Wears Hearing Aids: No  Functional Ability / Safety Screening 1.  Was the timed Get Up and Go test shorter than 30 seconds?  yes 2.  Does the patient need help with the phone, transportation, shopping,  preparing meals, housework, laundry, medications, or managing money?  yes 3.  Is the patient's home free of loose throw rugs in walkways, pet beds, electrical cords, etc?   yes      Grab bars in the bathroom? yes      Handrails on the stairs?   yes      Adequate lighting?   yes 4.  Has the patient noticed any hearing difficulties?   yes  Diet Recall and Exercise Regimen:     Current Exercise Habits: Home exercise routine, Type of exercise: walking, Time (Minutes): 30, Frequency (Times/Week): 3, Weekly Exercise (Minutes/Week): 90, Intensity: Moderate Exercise limited by: None identified  Not eating very healthy lately, but will resume a healthier diet    Advanced Care Planning: A voluntary discussion about advance care planning including the explanation and discussion of advance directives.  Discussed health care proxy and Living will, and the patient was able to identify a health care proxy as husband Patient does have a living will at present time. If patient does have living will, I have requested they bring this to the clinic to be scanned in to their chart. Does patient have a HCPOA?    yes If yes, name and contact information:  Does patient have a living will or MOST form?  yes  Cancer Screenings:  Lung:  Low Dose CT Chest recommended if Age 72-80 years, 30  pack-year currently smoking OR have quit w/in 15years. Patient does not qualify. Breast:  Up to date on Mammogram? No  Up to date of Bone Density/Dexa? Yes - we will repeat next year Colon: repeat in 2022   Additional Screenings:   Hepatitis C Screening: 05/2016 Intimate Partner Violence: none  Objective:   Vitals: BP 130/80 (BP Location: Right Arm, Patient Position: Sitting, Cuff Size: Normal)   Pulse (!) 57   Temp 98.3 F (36.8 C) (Oral)   Resp 14   Ht 5' 3.5" (1.613 m)   Wt 204 lb 3.2 oz (92.6 kg)   SpO2 98%   BMI 35.61 kg/m  Body mass index is 35.61 kg/m.  No exam data present  Physical Exam Constitutional: Patient appears well-developed and well-nourished. Obese  No distress.  HEENT: head atraumatic, normocephalic, pupils equal and reactive to light,  neck supple, throat within normal limits Cardiovascular: Normal rate, regular rhythm and normal heart sounds.  No murmur heard. No BLE edema. Pulmonary/Chest: Effort normal and breath sounds normal. No respiratory distress. Breast: normal breast exam  Abdominal: Soft.  There is no tenderness. Psychiatric: Patient has a normal mood and affect. behavior is normal. Judgment and thought content normal.  Cognitive Testing - 6-CIT  Correct? Score   What year is it? yes 0 Yes = 0    No = 4  What month is it? yes 0 Yes = 0    No = 3  Remember:     Pia [address]au, [place], [place]     What time is it? yes 0 Yes = 0    No = 3  Count backwards from 20 to 1 yes 0 Correct = 0    1 error = 2   More than 1 error = 4  Say the months of the year in reverse. yes 0 Correct = 0    1 error = 2   More than 1 error = 4  What address did I ask you to remember? yes 0 Correct = 0  1 error = 2  2 error = 4    3 error = 6    4 error = 8    All wrong = 10       TOTAL SCORE  0/28   Interpretation:  Normal  Normal (0-7) Abnormal (8-28)   Fall Risk: Fall Risk  11/19/2016 08/19/2016 05/20/2016  Falls in the past year? Yes No No  Number falls  in past yr: 1 - -  Injury with Fall? Yes - -  Follow up Education provided - -    Depression Screen Depression screen [facility] 2/9 11/19/2016 08/19/2016 05/20/2016  Decreased Interest 1 0 0  Down, Depressed, Hopeless 1 0 0  PHQ - 2 Score 2 0 0  Altered sleeping 1 - -  Tired, decreased energy 1 - -  Change in appetite 1 - -  Feeling bad or failure about yourself  1 - -  Trouble concentrating 1 - -  Moving slowly or fidgety/restless 0 - -  Suicidal thoughts 0 - -  PHQ-9 Score 7 - -  Difficult doing work/chores Somewhat difficult - -    Recent Results (from the past 2160 hour(s))  CBC with Differential     Status: None   Collection Time: 09/23/16  6:16 PM  Result Value Ref Range   WBC 7.3 3.6 - 11.0 K/uL   RBC 5.08 3.80 - 5.20 MIL/uL   Hemoglobin 15.6 12.0 - 16.0 g/dL   HCT 45.2 35.0 - 47.0 %   MCV 89.0 80.0 - 100.0 fL   MCH 30.8 26.0 - 34.0 pg   MCHC 34.6 32.0 - 36.0 g/dL   RDW 13.2 11.5 - 14.5 %   Platelets 191 150 - 440 K/uL   Neutrophils Relative % 54 %   Neutro Abs 4.0 1.4 - 6.5 K/uL   Lymphocytes Relative 35 %   Lymphs Abs 2.5 1.0 - 3.6 K/uL   Monocytes Relative 8 %   Monocytes Absolute 0.5 0.2 - 0.9 K/uL   Eosinophils Relative 2 %   Eosinophils Absolute 0.1 0 - 0.7 K/uL   Basophils Relative 1 %   Basophils Absolute 0.0 0 - 0.1 K/uL  Basic metabolic panel     Status: Abnormal   Collection Time: 09/23/16  6:16 PM  Result Value Ref Range   Sodium 137 135 - 145 mmol/L   Potassium 3.7 3.5 - 5.1 mmol/L   Chloride 105 101 - 111 mmol/L   CO2 23 22 - 32 mmol/L   Glucose, Bld 105 (H) 65 - 99 mg/dL   BUN 19 6 - 20 mg/dL   Creatinine, Ser 0.84 0.44 - 1.00 mg/dL   Calcium 9.3 8.9 - 10.3 mg/dL   GFR calc non Af Amer >60 >60 mL/min   GFR calc Af Amer >60 >60 mL/min    Comment: (NOTE) The eGFR has been calculated using the CKD EPI equation. This calculation has not been validated in all clinical situations. eGFR's persistently <60 mL/min signify possible Chronic  Kidney Disease.    Anion gap 9 5 - 15  Troponin I     Status: None   Collection Time: 09/23/16  6:16 PM  Result Value Ref Range   Troponin I <0.03 <0.03 ng/mL  Hepatic function panel     Status: None   Collection Time: 10/29/16 11:29 AM  Result Value Ref Range   Total Protein 7.0 6.0 - 8.5 g/dL   Albumin 4.4 3.6 - 4.8 g/dL   Bilirubin Total 0.7 0.0 - 1.2 mg/dL   Bilirubin,  Direct 0.20 0.00 - 0.40 mg/dL   Alkaline Phosphatase 89 39 - 117 IU/L   AST 25 0 - 40 IU/L   ALT 21 0 - 32 IU/L    Assessment & Plan:    1. Medicare annual wellness visit, subsequent  Discussed importance of 150 minutes of physical activity weekly, eat two servings of fish weekly, eat one serving of tree nuts ( cashews, pistachios, pecans, almonds.Marland Kitchen) every other day, eat 6 servings of fruit/vegetables daily and drink plenty of water and avoid sweet beverages.   2. Encounter for immunization  - omeprazole (PRILOSEC) 20 MG capsule; Take 20 mg by mouth every other day. - diazepam (VALIUM) 5 MG tablet; Take 1 tablet (5 mg total) by mouth daily as needed for anxiety.  Dispense: 30 tablet; Refill: 0 - Flu vaccine HIGH DOSE PF - MM DIGITAL SCREENING BILATERAL; Future - Pneumococcal polysaccharide vaccine 23-valent greater than or equal to 2yo subcutaneous/IM  3. Breast cancer screening  - MM DIGITAL SCREENING BILATERAL; Future  4. Influenza vaccine needed  Flu vaccine  5. Mixed hyperlipidemia   6. Essential hypertension   7. Obesity, Class I, BMI 30.0-34.9 (see actual BMI)  She needs to resume   8. Anxiety  - diazepam (VALIUM) 5 MG tablet; Take 1 tablet (5 mg total) by mouth daily as needed for anxiety.  Dispense: 30 tablet; Refill: 0  9. Mild major depression ([place])   10. Need for Streptococcus pneumoniae vaccination  - Pneumococcal polysaccharide vaccine 23-valent greater than or equal to 2yo subcutaneous/IM  11. Hearing difficulty of both ears  - Ambulatory referral to ENT    -  Discussed health benefits of physical activity, and encouraged her to engage in regular exercise appropriate for her age and condition.   Immunization History  Administered Date(s) Administered  . Influenza, High Dose Seasonal PF 11/19/2016  . Influenza-Unspecified 01/05/2015  . Pneumococcal Conjugate-13 07/03/2014  . Pneumococcal Polysaccharide-23 11/03/2011  . Td 08/13/2009  . Zoster 02/19/2012    Health Maintenance  Topic Date Due  . MAMMOGRAM  08/15/2016  . PNA vac Low Risk Adult (2 of 2 - PPSV23) 11/02/2016  . TETANUS/TDAP  08/14/2019  . COLONOSCOPY  10/27/2020  . INFLUENZA VACCINE  Completed  . DEXA SCAN  Completed  . Hepatitis C Screening  Completed    Meds ordered this encounter  Medications  . omeprazole (PRILOSEC) 20 MG capsule    Sig: Take 20 mg by mouth every other day.  . diazepam (VALIUM) 5 MG tablet    Sig: Take 1 tablet (5 mg total) by mouth daily as needed for anxiety.    Dispense:  30 tablet    Refill:  0    Current Outpatient Prescriptions:  .  acetaminophen (TYLENOL) 500 MG tablet, Take 1 tablet (500 mg total) by mouth 2 (two) times daily., Disp: 60 tablet, Rfl: 0 .  atorvastatin (LIPITOR) 40 MG tablet, Take 1 tablet (40 mg total) by mouth daily at 6 PM., Disp: 90 tablet, Rfl: 1 .  diazepam (VALIUM) 5 MG tablet, Take 1 tablet (5 mg total) by mouth daily as needed for anxiety., Disp: 30 tablet, Rfl: 0 .  EPINEPHrine (EPIPEN 2-PAK) 0.3 mg/0.3 mL IJ SOAJ injection, Inject 0.3 mLs (0.3 mg total) into the muscle as needed (anaphylaxis). Then seek medical care, Disp: 1 Device, Rfl: 0 .  estropipate (OGEN) 0.75 MG tablet, Take 1 tablet (0.75 mg total) by mouth every other day., Disp: 30 tablet, Rfl: 5 .  losartan-hydrochlorothiazide (HYZAAR) 50-12.5  MG tablet, Take 1 tablet by mouth daily., Disp: 90 tablet, Rfl: 1 .  Multiple Vitamin (MULTI-VITAMINS) TABS, Take by mouth., Disp: , Rfl:  .  omeprazole (PRILOSEC) 20 MG capsule, Take 20 mg by mouth every other day.,  Disp: , Rfl:  .  diclofenac sodium (VOLTAREN) 1 % GEL, Apply 4 g topically 4 (four) times daily. (Patient not taking: Reported on 11/19/2016), Disp: 100 g, Rfl: 0 Medications Discontinued During This Encounter  Medication Reason  . HYDROcodone-acetaminophen (NORCO) 5-325 MG tablet Completed Course  . famotidine (PEPCID) 20 MG tablet Completed Course  . esomeprazole (NEXIUM) 20 MG packet Completed Course  . predniSONE (DELTASONE) 20 MG tablet Completed Course  . diazepam (VALIUM) 5 MG tablet Reorder    I have personally reviewed and addressed the Medicare Annual Wellness health risk assessment questionnaire and have noted the following in the patient's chart:  A.         Medical and social history & family history B.         Use of alcohol, tobacco, and illicit drugs  C.         Current medications and supplements D.         Functional and Cognitive ability and status E.         Nutritional status F.         Physical activity G.        Advance directives H.         List of other physicians I.          Hospitalizations, surgeries, and ER visits in previous 12 months J.         [place] such as hearing, vision, cognitive function, and depression   In addition, I have reviewed and discussed with patient certain preventive protocols, quality metrics, and best practice recommendations. A written personalized care plan for preventive services as well as general preventive health recommendations were provided to patient.  See attached scanned questionnaire for additional information.

## 2016-11-19 NOTE — Patient Instructions (Addendum)
Preventing Falls and Fractures  Falls can be very serious, especially for older adults or people with osteoporosis  Falls can be caused by:  Tripping or slipping  Slow reflexes  Balance problems  Reduced muscle strength  Poor vision or a recent change in prescription  Illness and some medications (especially blood pressure pills, diuretics, heart medicines, muscle relaxants and sleep medications)  Drinking alcohol  To prevent falls outdoors:  Use a can or walker if needed  Wear rubber-soled shoes so you don't slip  DO NOT buy "shape up" shoes with rocker bottom soles if you have balance problems.  The thick soles and shape make it more difficult to keep your balance.  Put kitty litter or salt on icy sidewalks  Walk on the grass if the sidewalks are slick  Avoid walking on uneven ground whenever possible  T prevent falls indoors:  Keep rooms clutter-free, especially hallways, stairs and paths to light switches  Remove throw rugs  Install night lights, especially to and in the bathroom  Turn on lights before going downstairs  Keep a flashlight next to your bed  Buy a cordless phone to keep with you instead of jumping up to answer the phone  Install grab bars in the bathroom near the shower and toilet  Install rails on both sides of the stairs.  Make sure the stairs are well lit  Wear slippers with non-skid soles.  Do not walk around in stockings or socks  Balance problems and dizziness are not a normal part of growing older.  If you begin having balance problems or dizziness see your doctor.  Physical Therapy can help you with many balance problems, strengthening hip and leg muscles and with gait training.  To keep your bones healthy make sure you are getting enough calcium and Vitamin D each day.  Ask your doctor or pharmacist about supplements.  Regular weight-bearing exercise like walking, lifting weights or dancing can help strengthen bones and prevent  osteoporosis.  Preventive Care 39 Years and Older, Female Preventive care refers to lifestyle choices and visits with your health care provider that can promote health and wellness. What does preventive care include?  A yearly physical exam. This is also called an annual well check.  Dental exams once or twice a year.  Routine eye exams. Ask your health care provider how often you should have your eyes checked.  Personal lifestyle choices, including: ? Daily care of your teeth and gums. ? Regular physical activity. ? Eating a healthy diet. ? Avoiding tobacco and drug use. ? Limiting alcohol use. ? Practicing safe sex. ? Taking low-dose aspirin every day. ? Taking vitamin and mineral supplements as recommended by your health care provider. What happens during an annual well check? The services and screenings done by your health care provider during your annual well check will depend on your age, overall health, lifestyle risk factors, and family history of disease. Counseling Your health care provider may ask you questions about your:  Alcohol use.  Tobacco use.  Drug use.  Emotional well-being.  Home and relationship well-being.  Sexual activity.  Eating habits.  History of falls.  Memory and ability to understand (cognition).  Work and work Statistician.  Reproductive health.  Screening You may have the following tests or measurements:  Height, weight, and BMI.  Blood pressure.  Lipid and cholesterol levels. These may be checked every 5 years, or more frequently if you are over 62 years old.  Skin check.  Lung  cancer screening. You may have this screening every year starting at age 47 if you have a 30-pack-year history of smoking and currently smoke or have quit within the past 15 years.  Fecal occult blood test (FOBT) of the stool. You may have this test every year starting at age 64.  Flexible sigmoidoscopy or colonoscopy. You may have a sigmoidoscopy  every 5 years or a colonoscopy every 10 years starting at age 52.  Hepatitis C blood test.  Hepatitis B blood test.  Sexually transmitted disease (STD) testing.  Diabetes screening. This is done by checking your blood sugar (glucose) after you have not eaten for a while (fasting). You may have this done every 1-3 years.  Bone density scan. This is done to screen for osteoporosis. You may have this done starting at age 66.  Mammogram. This may be done every 1-2 years. Talk to your health care provider about how often you should have regular mammograms.  Talk with your health care provider about your test results, treatment options, and if necessary, the need for more tests. Vaccines Your health care provider may recommend certain vaccines, such as:  Influenza vaccine. This is recommended every year.  Tetanus, diphtheria, and acellular pertussis (Tdap, Td) vaccine. You may need a Td booster every 10 years.  Varicella vaccine. You may need this if you have not been vaccinated.  Zoster vaccine. You may need this after age 23.  Measles, mumps, and rubella (MMR) vaccine. You may need at least one dose of MMR if you were born in 1957 or later. You may also need a second dose.  Pneumococcal 13-valent conjugate (PCV13) vaccine. One dose is recommended after age 3.  Pneumococcal polysaccharide (PPSV23) vaccine. One dose is recommended after age 54.  Meningococcal vaccine. You may need this if you have certain conditions.  Hepatitis A vaccine. You may need this if you have certain conditions or if you travel or work in places where you may be exposed to hepatitis A.  Hepatitis B vaccine. You may need this if you have certain conditions or if you travel or work in places where you may be exposed to hepatitis B.  Haemophilus influenzae type b (Hib) vaccine. You may need this if you have certain conditions.  Talk to your health care provider about which screenings and vaccines you need and  how often you need them. This information is not intended to replace advice given to you by your health care provider. Make sure you discuss any questions you have with your health care provider. Document Released: 02/09/2015 Document Revised: 10/03/2015 Document Reviewed: 11/14/2014 Elsevier Interactive Patient Education  2017 [employer].

## 2016-11-29 ENCOUNTER — Other Ambulatory Visit: Payer: Self-pay | Admitting: Family Medicine

## 2016-12-04 DIAGNOSIS — H6123 Impacted cerumen, bilateral: Secondary | ICD-10-CM | POA: Diagnosis not present

## 2016-12-04 DIAGNOSIS — H903 Sensorineural hearing loss, bilateral: Secondary | ICD-10-CM | POA: Diagnosis not present

## 2017-01-08 ENCOUNTER — Ambulatory Visit
Admission: RE | Admit: 2017-01-08 | Discharge: 2017-01-08 | Disposition: A | Discharge status: Home / Self Care | Payer: Medicare HMO | Source: Ambulatory Visit | Attending: Family Medicine | Admitting: Family Medicine

## 2017-01-08 DIAGNOSIS — Z23 Encounter for immunization: Secondary | ICD-10-CM

## 2017-01-08 DIAGNOSIS — Z1231 Encounter for screening mammogram for malignant neoplasm of breast: Secondary | ICD-10-CM | POA: Insufficient documentation

## 2017-01-08 DIAGNOSIS — Z1239 Encounter for other screening for malignant neoplasm of breast: Secondary | ICD-10-CM

## 2017-01-15 ENCOUNTER — Other Ambulatory Visit: Payer: Self-pay | Admitting: *Deleted

## 2017-01-15 ENCOUNTER — Inpatient Hospital Stay
Admission: RE | Admit: 2017-01-15 | Discharge: 2017-01-15 | Disposition: A | Discharge status: Home / Self Care | Payer: Self-pay | Source: Ambulatory Visit | Attending: *Deleted | Admitting: *Deleted

## 2017-01-15 DIAGNOSIS — Z9289 Personal history of other medical treatment: Secondary | ICD-10-CM

## 2017-01-22 ENCOUNTER — Encounter: Payer: Self-pay | Admitting: Family Medicine

## 2017-01-23 ENCOUNTER — Other Ambulatory Visit: Payer: Self-pay | Admitting: Family Medicine

## 2017-01-23 MED ORDER — ESTROGENS CONJUGATED 0.45 MG PO TABS: 0.4500 mg | ORAL_TABLET | Freq: Every day | ORAL | 1 refills | Status: DC

## 2017-01-26 ENCOUNTER — Other Ambulatory Visit: Payer: Self-pay | Admitting: Family Medicine

## 2017-01-26 DIAGNOSIS — N951 Menopausal and female climacteric states: Secondary | ICD-10-CM

## 2017-01-29 ENCOUNTER — Telehealth: Payer: Self-pay | Admitting: Obstetrics & Gynecology

## 2017-01-29 NOTE — Telephone Encounter (Signed)
[facility] referring for Menopause syndrome. Called and spoke with patient about scheduling appointment. Pt will call back to be schedule

## 2017-01-30 ENCOUNTER — Other Ambulatory Visit: Payer: Self-pay

## 2017-01-30 ENCOUNTER — Ambulatory Visit (INDEPENDENT_AMBULATORY_CARE_PROVIDER_SITE_OTHER): Payer: Medicare HMO | Admitting: Obstetrics and Gynecology

## 2017-01-30 ENCOUNTER — Encounter: Payer: Self-pay | Admitting: Obstetrics and Gynecology

## 2017-01-30 VITALS — BP 102/62 | HR 86 | Ht 63.5 in | Wt 209.0 lb

## 2017-01-30 DIAGNOSIS — Z79890 Hormone replacement therapy (postmenopausal): Secondary | ICD-10-CM

## 2017-01-30 DIAGNOSIS — N951 Menopausal and female climacteric states: Secondary | ICD-10-CM | POA: Diagnosis not present

## 2017-01-30 MED ORDER — ESTROGENS CONJUGATED 0.3 MG PO TABS: 0.3000 mg | ORAL_TABLET | Freq: Every day | ORAL | 11 refills | Status: DC

## 2017-01-30 NOTE — Patient Instructions (Signed)
We discussed WHI study findings in detail.  In the combined estrogen-progesterone arm breast cancer risk was increased by 1.26 (CI of 1.00 to 1.59), coronary heart disease 1.29 (CI 1.02-1.63), stroke risk 1.41 (1.07-1.85), and pulmonary embolism 2.13 (CI 1.39-3.25).  That being said the while statistically significant the actual number of cases attributable are relatively small at an addition 8 cases of breast cancer, 7 more coronary artery event, 8 more strokes, and 8 additional case of pulmonary embolism per 10,000 women.  Study was terminated because of the increased breast cancer risk, this was not seen in the progestin only arm of the study for women without an intact uterus.  In addition it is important to note that HRT also had positive or risk reducing effects, and all cause mortality between the HRT/non-HRT users is not statistically different.  Estrogen-progestin HRT decreased the relative risk of hip fracture 0.66 (CI 0.45-0.98), colorectal cancer 0.63 (0.43-0.92).  Current consensus is to limit dose to the lowest effective dose, and shortest treatment duration possible.  Breast cancer risk appeared to increase after 4 years of use.  Also important to note is that these risk refer to systemic HRT for the treatment of vasomotor symptoms, and do not apply to vaginal preperations with minimal systemic absorption and aimed at treating symptoms of vulvovaginal atrophy.    We briefly touched on findings of WHIMS trial published in 2005 which looked at women 65 year of age or older, and whether HRT was protective against the development of dementia.  The study revealed that HRT actually increased the risk for the development of dementia but was limited by looking only at patients 65 years of age and older.  The subsequent KEEPS trial  In 2012 which looked at HRT in recently postmenopausal women did not show any improvement in cognitive function for women on HRT.  However, there was also no significant  cognitive declines seen in recently postmenopausal women receiving HRT as had previously been shown in the WHIMS trial.    

## 2017-01-30 NOTE — Progress Notes (Signed)
Obstetrics & Gynecology Office Visit   Chief Complaint:  Chief Complaint  Patient presents with  . vasomotor smptoms    discuss medications    History of Present Illness: 41 year seen in consultation at the request of [facility], Dr. Alba Cory,  with a history of prior hysterectomy, bilateral salpingectomy in her 30's.  She has been on estrogen containing HRT since that time.  The particular estrogen formulation she was previously on has been discontinued and she was changed to premarin by her PCP.  When she presented to pick up her prescription she read the package insert mentioning increased risk of dementia in Prermarin users.  She has not been on any HRT for approximately over 1 month and has noted bothersome vasomotor symptoms since discontinuation.  Her main concern is the dementia risk given that her mother started developing dementia at age 44.  She was also on premarin briefly.  In addition she has questions regarding length of HRT, particular in the setting of prior bilateral oophorectomy.   Review of Systems: Review of Systems  Constitutional: Negative for chills, diaphoresis, fever, malaise/fatigue and weight loss.  Cardiovascular: Negative for chest pain and palpitations.  Neurological: Negative for dizziness and headaches.  Endo/Heme/Allergies: Does not bruise/bleed easily.     Past Medical History:  Past Medical History:  Diagnosis Date  . Anxiety   . Arthritis   . Bradycardia   . Bronchitis   . GERD (gastroesophageal reflux disease)   . History of positive PPD 2009  . History of positive PPD 2009   INH through health dept CXR's needed for clearance  . Hyperlipidemia   . Hypertension   . Insomnia   . Obesity   . Rosacea     Past Surgical History:  Past Surgical History:  Procedure Laterality Date  . CERVICAL CONE BIOPSY    . COLONOSCOPY  10/2010  . COLONOSCOPY  2004  . endoscopic carpal tunn Bilateral   . HALLUX VALGUS CORRECTION Right  2005  . HYSTERECTOMY ABDOMINAL WITH SALPINGECTOMY  1981  . INCISION TENDON SHEATH HAND Right   . JOINT REPLACEMENT Right 11/21/2010   knee  . JOINT REPLACEMENT Left 11/04/2011   knee  . JOINT REPLACEMENT Left 04/27/2012   hip   . KNEE ARTHROSCOPY Right   . KNEE ARTHROSCOPY Left 12/18/2011    Gynecologic History: No LMP recorded. Patient has had a hysterectomy.  Obstetric History: No obstetric history on file.  Family History:  Family History  Problem Relation Age of Onset  . Alzheimer's disease Mother 34  . Osteoporosis Mother   . Stroke Father 52  . Arthritis Sister   . Basal cell carcinoma Sister   . Macular degeneration Other     Social History:  Social History   Socioeconomic History  . Marital status: Married    Spouse name: Not on file  . Number of children: 2  . Years of education: Not on file  . Highest education level: Not on file  Social Needs  . Financial resource strain: Not on file  . Food insecurity - worry: Not on file  . Food insecurity - inability: Not on file  . Transportation needs - medical: Not on file  . Transportation needs - non-medical: Not on file  Occupational History  . Not on file  Tobacco Use  . Smoking status: Never Smoker  . Smokeless tobacco: Never Used  Substance and Sexual Activity  . Alcohol use: Yes    Alcohol/week:  0.6 oz    Types: 1 Glasses of wine per week  . Drug use: No  . Sexual activity: No    Partners: Male    Birth control/protection: None  Other Topics Concern  . Not on file  Social History Narrative   She moved to [place] to downsize, but decided to move back to [place] after 2 years.    Married.   One daughter lives in [place].    The other [place] in [place].    Allergies:  Allergies  Allergen Reactions  . Sulfa Antibiotics Nausea Only  . Penicillins Rash    Medications: Prior to Admission medications   Medication Sig Start Date End Date Taking? Authorizing Provider  acetaminophen  (TYLENOL) 500 MG tablet Take 1 tablet (500 mg total) by mouth 2 (two) times daily. 08/19/16  Yes Sowles, Danna HeftyKrichna, MD  atorvastatin (LIPITOR) 40 MG tablet Take 1 tablet (40 mg total) by mouth daily at 6 PM. 09/09/16  Yes Sowles, Danna HeftyKrichna, MD  diazepam (VALIUM) 5 MG tablet Take 1 tablet (5 mg total) by mouth daily as needed for anxiety. 11/19/16  Yes Sowles, Danna HeftyKrichna, MD  EPINEPHrine (EPIPEN 2-PAK) 0.3 mg/0.3 mL IJ SOAJ injection Inject 0.3 mLs (0.3 mg total) into the muscle as needed (anaphylaxis). Then seek medical care 09/23/16  Yes Renford DillsMiller, Lindsey, NP  losartan-hydrochlorothiazide (HYZAAR) 50-12.5 MG tablet TAKE 1 TABLET BY MOUTH EVERY DAY 11/30/16  Yes Alba CorySowles, Krichna, MD  Multiple Vitamin (MULTI-VITAMINS) TABS Take by mouth.   Yes [provider]  omeprazole (PRILOSEC) 20 MG capsule Take 20 mg by mouth every other day.   Yes [provider]  estrogens, conjugated, (PREMARIN) 0.3 MG tablet Take 1 tablet (0.3 mg total) by mouth daily. 01/30/17   Vena AustriaStaebler, Jorell Agne, MD    Physical Exam Blood pressure 102/62, pulse 86, height 5' 3.5" (1.613 m), weight 209 lb (94.8 kg).  No LMP recorded. Patient has had a hysterectomy.  General: NAD HEENT: normocephalic, anicteric Pulmonary: No increased work of breathing Neurologic: Grossly intact Psychiatric: mood appropriate, affect full  Assessment: 72 y.o. vasomotor symptoms and HRT management  Plan: Problem List Items Addressed This Visit    None    Visit Diagnoses    Postmenopausal HRT (hormone replacement therapy)    -  Primary   Vasomotor symptoms due to menopause         We discussed WHI study findings in detail.  In the combined estrogen-progesterone arm breast cancer risk was increased by 1.26 (CI of 1.00 to 1.59), coronary heart disease 1.29 (CI 1.02-1.63), stroke risk 1.41 (1.07-1.85), and pulmonary embolism 2.13 (CI 1.39-3.25).  That being said the while statistically significant the actual number of cases attributable are  relatively small at an addition 8 cases of breast cancer, 7 more coronary artery event, 8 more strokes, and 8 additional case of pulmonary embolism per 10,000 women.  Study was terminated because of the increased breast cancer risk, this was not seen in the progestin only arm of the study for women without an intact uterus.  In addition it is important to note that HRT also had positive or risk reducing effects, and all cause mortality between the HRT/non-HRT users is not statistically different.  Estrogen-progestin HRT decreased the relative risk of hip fracture 0.66 (CI 0.45-0.98), colorectal cancer 0.63 (0.43-0.92).  Current consensus is to limit dose to the lowest effective dose, and shortest treatment duration possible.  Breast cancer risk appeared to increase after 4 years of use.  Also important to note  is that these risk refer to systemic HRT for the treatment of vasomotor symptoms, and do not apply to vaginal preperations with minimal systemic absorption and aimed at treating symptoms of vulvovaginal atrophy.    We briefly touched on findings of WHIMS trial published in 2005 which looked at women 64 year of age or older, and whether HRT was protective against the development of dementia.  The study revealed that HRT actually increased the risk for the development of dementia but was limited by looking only at patients 14 years of age and older.  The subsequent KEEPS trial  In 2012 which looked at HRT in recently postmenopausal women did not show any improvement in cognitive function for women on HRT.  However, there was also no significant cognitive declines seen in recently postmenopausal women receiving HRT as had previously been shown in the WHIMS trial.    - Recommendation is to discontinue HRT given current age.  In order to facilitate transition off I would recommend titration over time instead of straight discontinuation.  Average age of menopause is 98 in the [place], generally patient undergoing  bilateral oophorectomy are kept on physiologic dose estrogen until 50 and it is my general practice to then titrate down to effect controlling vasomotor symptoms with goal being ultimate discontinuation.    - We spent particular time discussing the findings of the WHIMS trial given maternal history of dementia/alzheimers.  We discussed that this risk is seen with any estrogen formulation and is not unique to premarin.  - Rx premarin 0.3mg  written, samples provided and savings card provided  - A total of 30 minutes were spent in face-to-face contact with the patient during this encounter with over half of that time devoted to counseling and coordination of care.  - Return in about 3 months (around 04/30/2017) for medication follow up.

## 2017-03-11 DIAGNOSIS — R69 Illness, unspecified: Secondary | ICD-10-CM | POA: Diagnosis not present

## 2017-03-20 ENCOUNTER — Ambulatory Visit (INDEPENDENT_AMBULATORY_CARE_PROVIDER_SITE_OTHER): Payer: Medicare HMO | Admitting: Family Medicine

## 2017-03-20 ENCOUNTER — Encounter: Payer: Self-pay | Admitting: Family Medicine

## 2017-03-20 VITALS — BP 110/68 | HR 58 | Resp 16 | Ht 64.0 in | Wt 202.1 lb

## 2017-03-20 DIAGNOSIS — E669 Obesity, unspecified: Secondary | ICD-10-CM

## 2017-03-20 DIAGNOSIS — I10 Essential (primary) hypertension: Secondary | ICD-10-CM

## 2017-03-20 DIAGNOSIS — M19042 Primary osteoarthritis, left hand: Secondary | ICD-10-CM

## 2017-03-20 DIAGNOSIS — E782 Mixed hyperlipidemia: Secondary | ICD-10-CM | POA: Diagnosis not present

## 2017-03-20 DIAGNOSIS — K219 Gastro-esophageal reflux disease without esophagitis: Secondary | ICD-10-CM | POA: Diagnosis not present

## 2017-03-20 DIAGNOSIS — M19041 Primary osteoarthritis, right hand: Secondary | ICD-10-CM | POA: Diagnosis not present

## 2017-03-20 DIAGNOSIS — Z23 Encounter for immunization: Secondary | ICD-10-CM | POA: Diagnosis not present

## 2017-03-20 DIAGNOSIS — G4700 Insomnia, unspecified: Secondary | ICD-10-CM

## 2017-03-20 MED ORDER — ATORVASTATIN CALCIUM 40 MG PO TABS: 40.0000 mg | ORAL_TABLET | Freq: Every day | ORAL | 1 refills | Status: DC

## 2017-03-20 MED ORDER — LOSARTAN POTASSIUM 50 MG PO TABS: 50.0000 mg | ORAL_TABLET | Freq: Every day | ORAL | 1 refills | Status: DC

## 2017-03-20 MED ORDER — OMEPRAZOLE 20 MG PO CPDR: 20.0000 mg | DELAYED_RELEASE_CAPSULE | ORAL | 1 refills | Status: DC

## 2017-03-20 MED ORDER — ZOSTER VAC RECOMB ADJUVANTED 50 MCG/0.5ML IM SUSR: 0.5000 mL | Freq: Once | INTRAMUSCULAR | 1 refills | Status: AC

## 2017-03-20 NOTE — Patient Instructions (Signed)

## 2017-03-20 NOTE — Progress Notes (Signed)
Name: Kari Lee   MRN: [id number]    DOB: 1945-09-12   Date:03/20/2017       Progress Note  Subjective  Chief Complaint  Chief Complaint  Patient presents with  . Hypertension  . Hyperlipidemia  . Gastroesophageal Reflux    HPI  HTN she is taking medication and denies side effects. No chest pain or palpitation. BP is at goal today.  Anxiety: seldom takes Valium, she usually takes at most one per week  She states symptoms are intermittent, and she takes it and goes to sleep. She does not want to take any other medications  GERD: she has been taking Omeprazole now,  she tried to wean self off but it caused symptoms to get worse, she is trying to take the least amount possible. Aware of possible long term side effects  Obesity: weight has been stable, she lost 8 lbs on a strict low carbohydrate diet to help with fatty liver, but gained a few more lobs since last visit.   Hot Flashes: she saw Dr. Chauncey Cruel and given premarin, but too expensive, she has noticed dryness in her face and also hot flashes, discussed options such as clonidine or Effexor but she refuses at this time, she will contact gyn   Elevated liver enzymes: over 100's , seen by Dr. Lars Pinks, US showed fatty liver, changed diet and lost weight, liver enzymes normalized. She would like to have labs repeated.   Patient Active Problem List   Diagnosis Date Noted  . Mild major depression (HCC) 11/19/2016  . Traumatic closed nondisplaced fracture of one rib of right side 08/22/2016  . Osteoarthritis of both hands 08/19/2016  . Fatty liver disease, nonalcoholic 05/30/2016  . Bradycardia 05/20/2016  . Current long-term use of postmenopausal hormone replacement therapy 05/20/2016  . GERD (gastroesophageal reflux disease) 05/20/2016  . Hyperlipidemia, unspecified 05/20/2016  . Hypertension 05/20/2016  . Insomnia 05/20/2016  . Obesity (BMI 30-39.9) 05/20/2016  . Arthritis 05/20/2016  . Anxiety 05/20/2016  . Rosacea  05/20/2016  . History of hip replacement, total, 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    Past Surgical History:  Procedure Laterality Date  . CERVICAL CONE BIOPSY    . COLONOSCOPY  10/2010  . COLONOSCOPY  2004  . endoscopic carpal tunn Bilateral   . HALLUX VALGUS CORRECTION Right 2005  . HYSTERECTOMY ABDOMINAL WITH SALPINGECTOMY  1981  . INCISION TENDON SHEATH HAND Right   . JOINT REPLACEMENT Right 11/21/2010   knee  . JOINT REPLACEMENT Left 11/04/2011   knee  . JOINT REPLACEMENT Left 04/27/2012   hip   . KNEE ARTHROSCOPY Right   . KNEE ARTHROSCOPY Left 12/18/2011    Family History  Problem Relation Age of Onset  . Alzheimer's disease Mother 43  . Osteoporosis Mother   . Stroke Father 54  . Arthritis Sister   . Basal cell carcinoma Sister   . Macular degeneration Other     Social History   Socioeconomic History  . Marital status: Married    Spouse name: Not on file  . Number of children: 2  . Years of education: Not on file  . Highest education level: Not on file  Social Needs  . Financial resource strain: Not on file  . Food insecurity - worry: Not on file  . Food insecurity - inability: Not on file  . Transportation needs - medical: Not on file  . Transportation needs - non-medical: Not on file  Occupational History  . Not on file  Tobacco Use  .  Smoking status: Never Smoker  . Smokeless tobacco: Never Used  Substance and Sexual Activity  . Alcohol use: Yes    Alcohol/week: 0.6 oz    Types: 1 Glasses of wine per week  . Drug use: No  . Sexual activity: No    Partners: Male    Birth control/protection: None  Other Topics Concern  . Not on file  Social History Narrative   She moved to [place] to downsize, but decided to move back to [place] after 2 years.    Married.   One daughter lives in [place].    The other daughter in [place].     Current Outpatient Medications:  .  acetaminophen (TYLENOL) 500 MG tablet, Take 1 tablet (500 mg total) by mouth 2 (two) times  daily., Disp: 60 tablet, Rfl: 0 .  atorvastatin (LIPITOR) 40 MG tablet, Take 1 tablet (40 mg total) by mouth daily at 6 PM., Disp: 90 tablet, Rfl: 1 .  diazepam (VALIUM) 5 MG tablet, Take 1 tablet (5 mg total) by mouth daily as needed for anxiety., Disp: 30 tablet, Rfl: 0 .  EPINEPHrine (EPIPEN 2-PAK) 0.3 mg/0.3 mL IJ SOAJ injection, Inject 0.3 mLs (0.3 mg total) into the muscle as needed (anaphylaxis). Then seek medical care, Disp: 1 Device, Rfl: 0 .  Multiple Vitamin (MULTI-VITAMINS) TABS, Take by mouth., Disp: , Rfl:  .  omeprazole (PRILOSEC) 20 MG capsule, Take 20 mg by mouth every other day., Disp: , Rfl:  .  zolpidem (AMBIEN) 5 MG tablet, Take 1 tablet by mouth at bedtime., Disp: , Rfl:   Allergies  Allergen Reactions  . Sulfa Antibiotics Nausea Only  . Penicillins Rash     ROS  Constitutional: Negative for fever or weight change.  Respiratory: Negative for cough and shortness of breath.   Cardiovascular: Negative for chest pain or palpitations.  Gastrointestinal: Negative for abdominal pain, no bowel changes.  Musculoskeletal: Negative for gait problem or joint swelling.  Skin: Negative for rash.  Neurological: Negative for dizziness or headache.  No other specific complaints in a complete review of systems (except as listed in HPI above).  Objective  Vitals:   03/20/17 1044  BP: 110/68  Pulse: (!) 58  Resp: 16  SpO2: 97%  Weight: 202 lb 1.6 oz (91.7 kg)  Height: 5\' 4"  (1.626 m)    Body mass index is 34.69 kg/m.  Physical Exam  Constitutional: Patient appears well-developed and well-nourished. Obese  No distress.  HEENT: head atraumatic, normocephalic, pupils equal and reactive to light,, neck supple, throat within normal limits Cardiovascular: Normal rate, regular rhythm and normal heart sounds.  No murmur heard. No BLE edema. Pulmonary/Chest: Effort normal and breath sounds normal. No respiratory distress. Abdominal: Soft.  There is no tenderness. Psychiatric:  Patient has a normal mood and affect. behavior is normal. Judgment and thought content normal.  PHQ2/9: Depression screen [facility] 2/9 11/19/2016 08/19/2016 05/20/2016  Decreased Interest 1 0 0  Down, Depressed, Hopeless 1 0 0  PHQ - 2 Score 2 0 0  Altered sleeping 1 - -  Tired, decreased energy 1 - -  Change in appetite 1 - -  Feeling bad or failure about yourself  1 - -  Trouble concentrating 1 - -  Moving slowly or fidgety/restless 0 - -  Suicidal thoughts 0 - -  PHQ-9 Score 7 - -  Difficult doing work/chores Somewhat difficult - -     Fall Risk: Fall Risk  03/20/2017 11/19/2016 08/19/2016 05/20/2016  Falls in  the past year? No Yes No No  Number falls in past yr: - 1 - -  Injury with Fall? - Yes - -  Follow up - Education provided - -     Functional Status Survey: Is the patient deaf or have difficulty hearing?: No Does the patient have difficulty seeing, even when wearing glasses/contacts?: No Does the patient have difficulty concentrating, remembering, or making decisions?: No Does the patient have difficulty walking or climbing stairs?: No Does the patient have difficulty dressing or bathing?: No Does the patient have difficulty doing errands alone such as visiting a doctor's office or shopping?: No   Assessment & Plan  1. Essential hypertension  - losartan (COZAAR) 50 MG tablet; Take 1 tablet (50 mg total) by mouth daily. New dose  Dispense: 90 tablet; Refill: 1 - Comprehensive metabolic panel  2. Obesity, Class I, BMI 30.0-34.9 (see actual BMI)  Discussed with the patient the risk posed by an increased BMI. Discussed importance of portion control, calorie counting and at least 150 minutes of physical activity weekly. Avoid sweet beverages and drink more water. Eat at least 6 servings of fruit and vegetables daily   3. Primary osteoarthritis of both hands  4. Insomnia, unspecified type  Waking up with hot flashes  5. Mixed hyperlipidemia  - atorvastatin (LIPITOR)  40 MG tablet; Take 1 tablet (40 mg total) by mouth daily at 6 PM.  Dispense: 90 tablet; Refill: 1 - Lipid panel  6. GERD without esophagitis  - omeprazole (PRILOSEC) 20 MG capsule; Take 1 capsule (20 mg total) by mouth every other day.  Dispense: 90 capsule; Refill: 1  7. Need for shingles vaccine  - Zoster Vaccine [employer]juvanted [employer](SHINGRIX) injection; Inject 0.5 mLs into the muscle once for 1 dose.  Dispense: 0.5 mL; Refill: 1

## 2017-03-24 DIAGNOSIS — E782 Mixed hyperlipidemia: Secondary | ICD-10-CM | POA: Diagnosis not present

## 2017-03-24 DIAGNOSIS — I10 Essential (primary) hypertension: Secondary | ICD-10-CM | POA: Diagnosis not present

## 2017-03-24 LAB — COMPREHENSIVE METABOLIC PANEL
AG RATIO: 1.7 (calc) (ref 1.0–2.5)
ALKALINE PHOSPHATASE (APISO): 81 U/L (ref 33–130)
ALT: 33 U/L — AB (ref 6–29)
AST: 23 U/L (ref 10–35)
Albumin: 4.3 g/dL (ref 3.6–5.1)
BILIRUBIN TOTAL: 0.7 mg/dL (ref 0.2–1.2)
BUN: 19 mg/dL (ref 7–25)
CALCIUM: 9.4 mg/dL (ref 8.6–10.4)
CHLORIDE: 105 mmol/L (ref 98–110)
CO2: 27 mmol/L (ref 20–32)
Creat: 0.72 mg/dL (ref 0.50–0.99)
GLUCOSE: 99 mg/dL (ref 65–99)
Globulin: 2.6 g/dL (calc) (ref 1.9–3.7)
Potassium: 3.9 mmol/L (ref 3.5–5.3)
Sodium: 139 mmol/L (ref 135–146)
Total Protein: 6.9 g/dL (ref 6.1–8.1)

## 2017-03-24 LAB — LIPID PANEL
CHOLESTEROL: 182 mg/dL (ref <200)
HDL: 71 mg/dL (ref >50)
LDL Cholesterol (Calc): 93 mg/dL (calc)
Non-HDL Cholesterol (Calc): 111 mg/dL (calc) (ref <130)
Total CHOL/HDL Ratio: 2.6 (calc) (ref <5.0)
Triglycerides: 89 mg/dL (ref <150)

## 2017-03-30 ENCOUNTER — Telehealth: Payer: Self-pay

## 2017-03-30 ENCOUNTER — Other Ambulatory Visit: Payer: Self-pay | Admitting: Obstetrics and Gynecology

## 2017-03-30 MED ORDER — ESTRADIOL 0.5 MG PO TABS: 0.5000 mg | ORAL_TABLET | Freq: Every day | ORAL | 11 refills | Status: DC

## 2017-03-30 NOTE — Telephone Encounter (Signed)
Pt inquiring if AMS can rx her something other than Premarin that he sent in January. She is trying to find something that will be less expensive for her. Cb#[phone]

## 2017-03-30 NOTE — Telephone Encounter (Signed)
Pt.notified

## 2017-03-30 NOTE — Telephone Encounter (Signed)
I've sent a prescription for estrace instead

## 2017-04-21 ENCOUNTER — Other Ambulatory Visit: Payer: Self-pay

## 2017-04-21 MED ORDER — ESTRADIOL 0.5 MG PO TABS: 0.5000 mg | ORAL_TABLET | Freq: Every day | ORAL | 3 refills | Status: DC

## 2017-05-04 ENCOUNTER — Ambulatory Visit: Payer: Medicare HMO | Admitting: Obstetrics and Gynecology

## 2017-06-25 ENCOUNTER — Ambulatory Visit
Admission: EM | Admit: 2017-06-25 | Discharge: 2017-06-25 | Disposition: A | Discharge status: Home / Self Care | Payer: Medicare HMO | Attending: Nurse Practitioner | Admitting: Nurse Practitioner

## 2017-06-25 ENCOUNTER — Other Ambulatory Visit: Payer: Self-pay

## 2017-06-25 ENCOUNTER — Encounter: Payer: Self-pay | Admitting: Emergency Medicine

## 2017-06-25 DIAGNOSIS — H1033 Unspecified acute conjunctivitis, bilateral: Secondary | ICD-10-CM

## 2017-06-25 DIAGNOSIS — J069 Acute upper respiratory infection, unspecified: Secondary | ICD-10-CM

## 2017-06-25 MED ORDER — METHYLPREDNISOLONE SODIUM SUCC 40 MG IJ SOLR
80.0000 mg | Freq: Once | INTRAMUSCULAR | Status: AC
  Administered 2017-06-25: 80 mg via INTRAMUSCULAR

## 2017-06-25 MED ORDER — OFLOXACIN 0.3 % OP SOLN: 2.0000 [drp] | Freq: Four times a day (QID) | OPHTHALMIC | 0 refills | Status: AC

## 2017-06-25 NOTE — Discharge Instructions (Addendum)
Use antibiotic eye drops as prescribe. May also use Zaditor eye drops twice daily as well. Continue over-the-counter treatment of choice for symptom management. Follow-up with your primary care provider as needed.

## 2017-06-25 NOTE — ED Provider Notes (Signed)
[facility]    CSN: [id number] Arrival date & time: 06/25/17  1130     History   Chief Complaint Chief Complaint  Patient presents with  . Eye Drainage    HPI Kari Lee is a 72 y.o. female.   Subjective:  Kari Lee is a 72 y.o. female who presents for evaluation of redness and itchiness. She has noticed the above symptoms in the bilateral eye for 3 days. Onset was gradual. Symptoms have included discharge, itching and tearing. Patient denies blurred vision, foreign body sensation, pain, photophobia and visual field deficit. There is a history of allergies, contact lens use and recent sinus/allergy/URI symptoms which include headache, nasal blockage, post nasal drip and nasal congestion.   The following portions of the patient's history were reviewed and updated as appropriate: allergies, current medications, past family history, past medical history, past social history, past surgical history and problem list.            Past Medical History:  Diagnosis Date  . Anxiety   . Arthritis   . Bradycardia   . Bronchitis   . GERD (gastroesophageal reflux disease)   . History of positive PPD 2009  . History of positive PPD 2009   INH through health dept CXR's needed for clearance  . Hyperlipidemia   . Hypertension   . Insomnia   . Obesity   . Rosacea     Patient Active Problem List   Diagnosis Date Noted  . Mild major depression (HCC) 11/19/2016  . Traumatic closed nondisplaced fracture of one rib of right side 08/22/2016  . Osteoarthritis of both hands 08/19/2016  . Fatty liver disease, nonalcoholic 05/30/2016  . Bradycardia 05/20/2016  . Current long-term use of postmenopausal hormone replacement therapy 05/20/2016  . GERD (gastroesophageal reflux disease) 05/20/2016  . Hyperlipidemia, unspecified 05/20/2016  . Hypertension 05/20/2016  . Insomnia 05/20/2016  . Obesity (BMI 30-39.9) 05/20/2016  . Arthritis 05/20/2016  . Anxiety  05/20/2016  . Rosacea 05/20/2016  . History of hip replacement, total, 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    Past Surgical History:  Procedure Laterality Date  . CERVICAL CONE BIOPSY    . COLONOSCOPY  10/2010  . COLONOSCOPY  2004  . endoscopic carpal tunn Bilateral   . HALLUX VALGUS CORRECTION Right 2005  . HYSTERECTOMY ABDOMINAL WITH SALPINGECTOMY  1981  . INCISION TENDON SHEATH HAND Right   . JOINT REPLACEMENT Right 11/21/2010   knee  . JOINT REPLACEMENT Left 11/04/2011   knee  . JOINT REPLACEMENT Left 04/27/2012   hip   . KNEE ARTHROSCOPY Right   . KNEE ARTHROSCOPY Left 12/18/2011    OB History   None      Home Medications    Prior to Admission medications   Medication Sig Start Date End Date Taking? Authorizing Provider  acetaminophen (TYLENOL) 500 MG tablet Take 1 tablet (500 mg total) by mouth 2 (two) times daily. 08/19/16  Yes Sowles, Danna Hefty, MD  atorvastatin (LIPITOR) 40 MG tablet Take 1 tablet (40 mg total) by mouth daily at 6 PM. 03/20/17  Yes Sowles, Danna Hefty, MD  diazepam (VALIUM) 5 MG tablet Take 1 tablet (5 mg total) by mouth daily as needed for anxiety. 11/19/16  Yes Sowles, Danna Hefty, MD  EPINEPHrine (EPIPEN 2-PAK) 0.3 mg/0.3 mL IJ SOAJ injection Inject 0.3 mLs (0.3 mg total) into the muscle as needed (anaphylaxis). Then seek medical care 09/23/16  Yes Renford Dills, NP  estradiol (ESTRACE) 0.5 MG tablet Take 1 tablet (0.5 mg total) by  mouth daily. 04/21/17  Yes Vena Austria, MD  losartan (COZAAR) 50 MG tablet Take 1 tablet (50 mg total) by mouth daily. New dose 03/20/17  Yes Sowles, Danna Hefty, MD  Multiple Vitamin (MULTI-VITAMINS) TABS Take by mouth.   Yes [provider]  omeprazole (PRILOSEC) 20 MG capsule Take 1 capsule (20 mg total) by mouth every other day. 03/20/17  Yes Sowles, Danna Hefty, MD  zolpidem (AMBIEN) 5 MG tablet Take 1 tablet by mouth at bedtime.   Yes [provider]  ofloxacin (OCUFLOX) 0.3 % ophthalmic solution Place 2 drops into both eyes 4  (four) times daily for 5 days. 06/25/17 06/30/17  Lurline Idol, FNP    Family History Family History  Problem Relation Age of Onset  . Alzheimer's disease Mother 38  . Osteoporosis Mother   . Stroke Father 34  . Arthritis Sister   . Basal cell carcinoma Sister   . Macular degeneration Other     Social History Social History   Tobacco Use  . Smoking status: Never Smoker  . Smokeless tobacco: Never Used  Substance Use Topics  . Alcohol use: Yes    Alcohol/week: 0.6 oz    Types: 1 Glasses of wine per week  . Drug use: No     Allergies   Sulfa antibiotics and Penicillins   Review of Systems Review of Systems  Constitutional: Negative for fever.  HENT: Positive for congestion, postnasal drip, rhinorrhea and sneezing. Negative for sinus pressure, sinus pain and sore throat.   Eyes: Positive for discharge, redness and itching. Negative for photophobia, pain and visual disturbance.  Respiratory: Negative for cough, shortness of breath and wheezing.   All other systems reviewed and are negative.    Physical Exam Triage Vital Signs ED Triage Vitals  Enc Vitals Group     BP 06/25/17 1145 (!) 145/79     Pulse Rate 06/25/17 1145 63     Resp --      Temp 06/25/17 1145 98.9 F (37.2 C)     Temp Source 06/25/17 1145 Oral     SpO2 06/25/17 1145 98 %     Weight 06/25/17 1148 194 lb (88 kg)     Height 06/25/17 1148  (1.626 m)     Head Circumference --      Peak Flow --      Pain Score 06/25/17 1148 7     Pain Loc --      Pain Edu? --      Excl. in GC? --    No data found.  Updated Vital Signs BP (!) 145/79 (BP Location: Left Arm)   Pulse 63   Temp 98.9 F (37.2 C) (Oral)   Ht  (1.626 m)   Wt 194 lb (88 kg)   SpO2 98%   BMI 33.30 kg/m   Visual Acuity Right Eye Distance: monovision contacts 20/25 Left Eye Distance: monovision contacts 20/70 Bilateral Distance: monovision contacts 20/25  Right Eye Near:   Left Eye Near:    Bilateral Near:      Physical Exam  Constitutional: She is oriented to person, place, and time. She appears well-developed and well-nourished.  HENT:  Head: Normocephalic.  Right Ear: External ear normal.  Left Ear: External ear normal.  Nose: Nose normal.  Mouth/Throat: Oropharynx is clear and moist.  Eyes: Pupils are equal, round, and reactive to light. EOM and lids are normal. Right conjunctiva is not injected. Left conjunctiva is not injected.  Neck: Normal range of motion.  Neck supple.  Cardiovascular: Normal rate and regular rhythm.  Pulmonary/Chest: Effort normal and breath sounds normal.  Musculoskeletal: Normal range of motion.  Lymphadenopathy:    She has no cervical adenopathy.  Neurological: She is alert and oriented to person, place, and time.  Skin: Skin is warm and dry.  Psychiatric: She has a normal mood and affect. Her behavior is normal.     UC Treatments / Results  Labs (all labs ordered are listed, but only abnormal results are displayed) Labs Reviewed - No data to display  EKG None  Radiology No results found.  Procedures Procedures (including critical care time)  Medications Ordered in UC Medications  methylPREDNISolone sodium succinate (SOLU-MEDROL) 40 mg/mL injection 80 mg (has no administration in time range)    Initial Impression / Assessment and Plan / UC Course  I have reviewed the triage vital signs and the nursing notes.  Pertinent labs & imaging results that were available during my care of the patient were reviewed by me and considered in my medical decision making (see chart for details).    72 year old female presenting with bilateral eye redness, itchiness, discharge and tearing for the past few days which has also been associated with recent sinus/allergy and URI type symptoms.  Physical/eye exam unremarkable.  Vital signs stable.  Afebrile.  Nontoxic-appearing   Plan:  1. Solu-Medrol 80 mg IM given in clinic 2. Discussed diagnosis and treatment  of URI. 3. Discussed the importance of avoiding unnecessary antibiotic therapy. 4. Suggested symptomatic OTC remedies. 5. Nasal saline spray for congestion. 6. Discussed the diagnosis and proper care of conjunctivitis.  Stressed household Presenter, broadcasting. 7. Ophthalmic drops per orders. 8. Follow up as needed.  Today's evaluation has revealed no signs of a dangerous process. Discussed diagnosis with patient. Patient aware of their diagnosis, possible red flag symptoms to watch out for and need for close follow up. Patient understands verbal and written discharge instructions. Patient comfortable with plan and disposition.  Patient has a clear mental status at this time, good insight into illness (after discussion and teaching) and has clear judgment to make decisions regarding their care.  Documentation was completed with the aid of voice recognition software. Transcription may contain typographical errors.  Final Clinical Impressions(s) / UC Diagnoses   Final diagnoses:  Acute conjunctivitis of both eyes, unspecified acute conjunctivitis type  Acute upper respiratory infection     Discharge Instructions     Use antibiotic eye drops as prescribe. May also use Zaditor eye drops twice daily as well. Continue over-the-counter treatment of choice for symptom management. Follow-up with your primary care provider as needed.    ED Prescriptions    Medication Sig Dispense Auth. Provider   ofloxacin (OCUFLOX) 0.3 % ophthalmic solution Place 2 drops into both eyes 4 (four) times daily for 5 days. 2 mL Lurline Idol, FNP     Controlled Substance Prescriptions [place] Controlled Substance Registry consulted? Not Applicable   Lurline Idol, FNP 06/25/17 1235

## 2017-06-25 NOTE — ED Triage Notes (Signed)
Patient c/o non productive cough, now has bilateral eye drainage x 3 days.

## 2017-07-03 ENCOUNTER — Other Ambulatory Visit: Payer: Self-pay | Admitting: Family Medicine

## 2017-07-03 DIAGNOSIS — I10 Essential (primary) hypertension: Secondary | ICD-10-CM

## 2017-07-20 ENCOUNTER — Encounter: Payer: Self-pay | Admitting: Family Medicine

## 2017-07-20 ENCOUNTER — Ambulatory Visit (INDEPENDENT_AMBULATORY_CARE_PROVIDER_SITE_OTHER): Payer: Medicare HMO | Admitting: Family Medicine

## 2017-07-20 VITALS — BP 116/64 | HR 82 | Temp 97.7°F | Resp 16 | Ht 64.0 in | Wt 188.0 lb

## 2017-07-20 DIAGNOSIS — E2839 Other primary ovarian failure: Secondary | ICD-10-CM

## 2017-07-20 DIAGNOSIS — K219 Gastro-esophageal reflux disease without esophagitis: Secondary | ICD-10-CM | POA: Diagnosis not present

## 2017-07-20 DIAGNOSIS — I10 Essential (primary) hypertension: Secondary | ICD-10-CM | POA: Diagnosis not present

## 2017-07-20 DIAGNOSIS — F325 Major depressive disorder, single episode, in full remission: Secondary | ICD-10-CM

## 2017-07-20 DIAGNOSIS — E782 Mixed hyperlipidemia: Secondary | ICD-10-CM | POA: Diagnosis not present

## 2017-07-20 DIAGNOSIS — R69 Illness, unspecified: Secondary | ICD-10-CM | POA: Diagnosis not present

## 2017-07-20 DIAGNOSIS — M19041 Primary osteoarthritis, right hand: Secondary | ICD-10-CM | POA: Diagnosis not present

## 2017-07-20 DIAGNOSIS — F419 Anxiety disorder, unspecified: Secondary | ICD-10-CM

## 2017-07-20 DIAGNOSIS — M19042 Primary osteoarthritis, left hand: Secondary | ICD-10-CM

## 2017-07-20 LAB — LIPID PANEL
CHOL/HDL RATIO: 2.6 (calc) (ref <5.0)
CHOLESTEROL: 166 mg/dL (ref <200)
HDL: 64 mg/dL (ref >50)
LDL CHOLESTEROL (CALC): 79 mg/dL
Non-HDL Cholesterol (Calc): 102 mg/dL (calc) (ref <130)
Triglycerides: 133 mg/dL (ref <150)

## 2017-07-20 LAB — COMPLETE METABOLIC PANEL WITH GFR
AG Ratio: 1.7 (calc) (ref 1.0–2.5)
ALBUMIN MSPROF: 4.5 g/dL (ref 3.6–5.1)
ALKALINE PHOSPHATASE (APISO): 84 U/L (ref 33–130)
ALT: 28 U/L (ref 6–29)
AST: 23 U/L (ref 10–35)
BUN: 17 mg/dL (ref 7–25)
CALCIUM: 9.8 mg/dL (ref 8.6–10.4)
CO2: 28 mmol/L (ref 20–32)
CREATININE: 0.77 mg/dL (ref 0.60–0.93)
Chloride: 105 mmol/L (ref 98–110)
GFR, EST NON AFRICAN AMERICAN: 78 mL/min/{1.73_m2} (ref >=60)
GFR, Est African American: 91 mL/min/{1.73_m2} (ref >=60)
GLUCOSE: 100 mg/dL — AB (ref 65–99)
Globulin: 2.6 g/dL (calc) (ref 1.9–3.7)
Potassium: 4.5 mmol/L (ref 3.5–5.3)
Sodium: 140 mmol/L (ref 135–146)
Total Bilirubin: 0.9 mg/dL (ref 0.2–1.2)
Total Protein: 7.1 g/dL (ref 6.1–8.1)

## 2017-07-20 MED ORDER — DIAZEPAM 5 MG PO TABS: 5.0000 mg | ORAL_TABLET | Freq: Every day | ORAL | 0 refills | Status: DC | PRN

## 2017-07-20 MED ORDER — ATORVASTATIN CALCIUM 40 MG PO TABS: 40.0000 mg | ORAL_TABLET | Freq: Every day | ORAL | 1 refills | Status: DC

## 2017-07-20 MED ORDER — LOSARTAN POTASSIUM 25 MG PO TABS: 25.0000 mg | ORAL_TABLET | Freq: Every day | ORAL | 0 refills | Status: DC

## 2017-07-20 NOTE — Progress Notes (Signed)
Name: Kari Lee   MRN: [id number]    DOB: 10/06/1945   Date:07/20/2017       Progress Note  Subjective  Chief Complaint  Chief Complaint  Patient presents with  . Medication Refill  . Hypertension  . Hyperlipidemia  . Gastroesophageal Reflux  . Anxiety    HPI   HTN she is taking medication and denies side effects. No chest pain or palpitation. BP is towards low end of normal and we will adjust does of medication, down from 50 mg to 25 mg   Anxiety: seldom takes Valium, she usually takes at most one per week  She states symptoms are intermittent, and she takes it and goes to sleep. Last refill was 10/2016 and needs more today   GERD: she has been takingOmeprazole every other day and is keeping symptoms under control, no heartburn or indigestion   Obesity: she joined [employer] 05/26/2017, she started at 200.2 lbs and is down to 188 lbs. She states soon she will also join a local gym   Hot Flashes: shesaw Dr. Chauncey Cruel  she was noticing dryness in her face and also hot flashes, she states half dose of estrogen is controlling her symptoms.   Elevated liver enzymes: over 100's , seen by Dr. Lars Pinks, US showed fatty liver, changed diet and lost weight, liver enzymes normalized. She would like to have labs repeated.   OA: most symptoms now on her hands, but has a history of knee replacement and left hip, has DIP deformities and also inability to make a tight fist.   Hyperlipidemia: she is on atorvastatin and is doing well at this time    Patient Active Problem List   Diagnosis Date Noted  . Mild major depression (HCC) 11/19/2016  . Traumatic closed nondisplaced fracture of one rib of right side 08/22/2016  . Osteoarthritis of both hands 08/19/2016  . Fatty liver disease, nonalcoholic 05/30/2016  . Bradycardia 05/20/2016  . Current long-term use of postmenopausal hormone replacement therapy 05/20/2016  . GERD (gastroesophageal reflux disease) 05/20/2016  .  Hyperlipidemia, unspecified 05/20/2016  . Hypertension 05/20/2016  . Insomnia 05/20/2016  . Obesity (BMI 30-39.9) 05/20/2016  . Arthritis 05/20/2016  . Anxiety 05/20/2016  . Rosacea 05/20/2016  . History of hip replacement, total, 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    Past Surgical History:  Procedure Laterality Date  . CERVICAL CONE BIOPSY    . COLONOSCOPY  10/2010  . COLONOSCOPY  2004  . endoscopic carpal tunn Bilateral   . HALLUX VALGUS CORRECTION Right 2005  . HYSTERECTOMY ABDOMINAL WITH SALPINGECTOMY  1981  . INCISION TENDON SHEATH HAND Right   . JOINT REPLACEMENT Right 11/21/2010   knee  . JOINT REPLACEMENT Left 11/04/2011   knee  . JOINT REPLACEMENT Left 04/27/2012   hip   . KNEE ARTHROSCOPY Right   . KNEE ARTHROSCOPY Left 12/18/2011    Family History  Problem Relation Age of Onset  . Alzheimer's disease Mother 44  . Osteoporosis Mother   . Stroke Father 51  . Arthritis Sister   . Basal cell carcinoma Sister   . Macular degeneration Other     Social History   Socioeconomic History  . Marital status: Married    Spouse name: Not on file  . Number of children: 2  . Years of education: Not on file  . Highest education level: Not on file  Occupational History  . Not on file  Social Needs  . Financial resource strain: Not on file  . Food  insecurity:    Worry: Not on file    Inability: Not on file  . Transportation needs:    Medical: Not on file    Non-medical: Not on file  Tobacco Use  . Smoking status: Never Smoker  . Smokeless tobacco: Never Used  Substance and Sexual Activity  . Alcohol use: Yes    Alcohol/week: 0.6 oz    Types: 1 Glasses of wine per week  . Drug use: No  . Sexual activity: Never    Partners: Male    Birth control/protection: None  Lifestyle  . Physical activity:    Days per week: Not on file    Minutes per session: Not on file  . Stress: Not on file  Relationships  . Social connections:    Talks on phone: Not on file    Gets together: Not  on file    Attends religious service: Not on file    Active member of club or organization: Not on file    Attends meetings of clubs or organizations: Not on file    Relationship status: Not on file  . Intimate partner violence:    Fear of current or ex partner: Not on file    Emotionally abused: Not on file    Physically abused: Not on file    Forced sexual activity: Not on file  Other Topics Concern  . Not on file  Social History Narrative   She moved to [place] to downsize, but decided to move back to [place] after 2 years.    Married.   One daughter lives in [place].    The other daughter in [place].     Current Outpatient Medications:  .  acetaminophen (TYLENOL) 500 MG tablet, Take 1 tablet (500 mg total) by mouth 2 (two) times daily., Disp: 60 tablet, Rfl: 0 .  atorvastatin (LIPITOR) 40 MG tablet, Take 1 tablet (40 mg total) by mouth daily at 6 PM., Disp: 90 tablet, Rfl: 1 .  diazepam (VALIUM) 5 MG tablet, Take 1 tablet (5 mg total) by mouth daily as needed for anxiety., Disp: 30 tablet, Rfl: 0 .  estradiol (ESTRACE) 0.5 MG tablet, Take 1 tablet (0.5 mg total) by mouth daily., Disp: 90 tablet, Rfl: 3 .  losartan (COZAAR) 50 MG tablet, TAKE 1 TABLET (50 MG TOTAL) BY MOUTH DAILY. NEW DOSE, Disp: 90 tablet, Rfl: 0 .  Multiple Vitamin (MULTI-VITAMINS) TABS, Take by mouth., Disp: , Rfl:  .  naproxen (NAPROSYN) 250 MG tablet, Take 250 mg by mouth as needed., Disp: , Rfl:  .  omeprazole (PRILOSEC) 20 MG capsule, Take 1 capsule (20 mg total) by mouth every other day., Disp: 90 capsule, Rfl: 1 .  zolpidem (AMBIEN) 5 MG tablet, Take 1 tablet by mouth at bedtime., Disp: , Rfl:  .  EPINEPHrine (EPIPEN 2-PAK) 0.3 mg/0.3 mL IJ SOAJ injection, Inject 0.3 mLs (0.3 mg total) into the muscle as needed (anaphylaxis). Then seek medical care (Patient not taking: Reported on 07/20/2017), Disp: 1 Device, Rfl: 0  Allergies  Allergen Reactions  . Sulfa Antibiotics Nausea Only  . Penicillins Rash      ROS  Constitutional: Negative for fever , positive for  weight change.  Respiratory: Negative for cough and shortness of breath.   Cardiovascular: Negative for chest pain or palpitations.  Gastrointestinal: Negative for abdominal pain, no bowel changes.  Musculoskeletal: Negative for gait problem or joint swelling.  Skin: Negative for rash.   Neurological: Negative for dizziness or headache.  No  other specific complaints in a complete review of systems (except as listed in HPI above).  Objective  Vitals:   07/20/17 0913  BP: 116/64  Pulse: 82  Resp: 16  Temp: 97.7 F (36.5 C)  TempSrc: Oral  SpO2: 98%  Weight: 188 lb (85.3 kg)  Height: 5\' 4"  (1.626 m)    Body mass index is 32.27 kg/m.  Physical Exam  Constitutional: Patient appears well-developed and well-nourished. Obese  No distress.  HEENT: head atraumatic, normocephalic, pupils equal and reactive to light, neck supple, throat within normal limits Cardiovascular: Normal rate, regular rhythm and normal heart sounds.  No murmur heard. No BLE edema. Pulmonary/Chest: Effort normal and breath sounds normal. No respiratory distress. Abdominal: Soft.  There is no tenderness. Muscular Skeletal: deformities noticed on DIP and PIP of both hands/fingers.  Psychiatric: Patient has a normal mood and affect. behavior is normal. Judgment and thought content normal.  PHQ2/9: Depression screen [facility] 2/9 07/20/2017 11/19/2016 08/19/2016 05/20/2016  Decreased Interest 0 1 0 0  Down, Depressed, Hopeless 0 1 0 0  PHQ - 2 Score 0 2 0 0  Altered sleeping 0 1 - -  Tired, decreased energy 0 1 - -  Change in appetite 0 1 - -  Feeling bad or failure about yourself  0 1 - -  Trouble concentrating 0 1 - -  Moving slowly or fidgety/restless 0 0 - -  Suicidal thoughts 0 0 - -  PHQ-9 Score 0 7 - -  Difficult doing work/chores Not difficult at all Somewhat difficult - -    Fall Risk: Fall Risk  07/20/2017 03/20/2017 11/19/2016 08/19/2016  05/20/2016  Falls in the past year? Yes No Yes No No  Number falls in past yr: 1 - 1 - -  Injury with Fall? Yes - Yes - -  Comment slipped on ketchup in a restaurant- fracture ribs - - - -  Follow up - - Education provided - -    Functional Status Survey: Is the patient deaf or have difficulty hearing?: No Does the patient have difficulty seeing, even when wearing glasses/contacts?: No Does the patient have difficulty concentrating, remembering, or making decisions?: No Does the patient have difficulty walking or climbing stairs?: No Does the patient have difficulty dressing or bathing?: No Does the patient have difficulty doing errands alone such as visiting a doctor's office or shopping?: No    Assessment & Plan  1. Essential hypertension  BP is lower since weight loss, no dizziness, but we will try going down to 25 mg daily from 50 mg daily  - losartan (COZAAR) 25 MG tablet; Take 1 tablet (25 mg total) by mouth daily. New dose  Dispense: 90 tablet; Refill: 0 - COMPLETE METABOLIC PANEL WITH GFR  2. Major depression in remission [facility])  Doing well at this time  3. Primary osteoarthritis of both hands  Still has pain, and has been taking naproxen at night, advised to take tylenol instead   4. Morbid obesity (HCC)  She joined weight watchers April 30th, 2019. She is doing great BMI still above 30 and has hyperlipidemia and HTN  5. GERD without esophagitis  Taking medication every other day   6. Mixed hyperlipidemia  - atorvastatin (LIPITOR) 40 MG tablet; Take 1 tablet (40 mg total) by mouth daily at 6 PM.  Dispense: 90 tablet; Refill: 1 - Lipid panel  7. Anxiety disorder, unspecified type  - diazepam (VALIUM) 5 MG tablet; Take 1 tablet (5 mg total) by mouth  daily as needed for anxiety.  Dispense: 30 tablet; Refill: 0  8. Ovarian failure  - DG Bone Density; Future

## 2017-09-14 DIAGNOSIS — R69 Illness, unspecified: Secondary | ICD-10-CM | POA: Diagnosis not present

## 2017-10-01 ENCOUNTER — Other Ambulatory Visit: Payer: Self-pay | Admitting: Family Medicine

## 2017-10-01 DIAGNOSIS — I10 Essential (primary) hypertension: Secondary | ICD-10-CM

## 2017-10-01 NOTE — Telephone Encounter (Signed)
Refill request for general medication. Losartan   Last office visit: 07/20/2017   Follow up on 10/20/2017

## 2017-10-05 DIAGNOSIS — S76912A Strain of unspecified muscles, fascia and tendons at thigh level, left thigh, initial encounter: Secondary | ICD-10-CM | POA: Diagnosis not present

## 2017-10-05 DIAGNOSIS — Z96642 Presence of left artificial hip joint: Secondary | ICD-10-CM | POA: Diagnosis not present

## 2017-10-07 ENCOUNTER — Telehealth: Payer: Self-pay

## 2017-10-07 NOTE — Telephone Encounter (Signed)
Copied from CRM [id number]. Topic: General - Other >> Oct 07, 2017  2:57 PM Baldo Daub L wrote: Reason for CRM:   Amy from [facility] calling as a FYI - she does medication management for pt and states that the pt has been late on getting refills of her medication. Amy has reached out to the patient but hasn't had a response back. Amy can be reached at [phone].

## 2017-10-12 DIAGNOSIS — H5213 Myopia, bilateral: Secondary | ICD-10-CM | POA: Diagnosis not present

## 2017-10-12 DIAGNOSIS — Z0100 Encounter for examination of eyes and vision without abnormal findings: Secondary | ICD-10-CM | POA: Diagnosis not present

## 2017-10-14 ENCOUNTER — Other Ambulatory Visit: Payer: Self-pay

## 2017-10-14 ENCOUNTER — Ambulatory Visit
Admission: EM | Admit: 2017-10-14 | Discharge: 2017-10-14 | Disposition: A | Discharge status: Home / Self Care | Payer: Medicare HMO | Attending: Family Medicine | Admitting: Family Medicine

## 2017-10-14 DIAGNOSIS — T1581XA Foreign body in other and multiple parts of external eye, right eye, initial encounter: Secondary | ICD-10-CM | POA: Diagnosis not present

## 2017-10-14 DIAGNOSIS — H5789 Other specified disorders of eye and adnexa: Secondary | ICD-10-CM | POA: Diagnosis not present

## 2017-10-14 NOTE — ED Provider Notes (Signed)
[facility] ____________________________________________  Time seen: Approximately 6:55 PM  I have reviewed the triage vital signs and the nursing notes.   HISTORY  Chief Complaint Eye Problem   HPI Kari Lee is a 72 y.o. female presenting for evaluation of possible retained contact to her right eye.  Patient states that this morning she thought her contact fell out while in the shower and as she was checking her eyes she felt like something fell out.  But reports after she checked her eyes she felt like there was something in her eye.  Patient states that she then put another contact in and it was uncomfortable which concerned her prompting her to come in today.  Patient states that there is still feels like there is something in her right upper eye.  Denies any other injury or trauma.  Denies any penetrating trauma.  Denies actual eye pain currently.  No drainage.  Denies any baseline vision changes.  No photophobia, light sensitivity, recent sickness or fevers.  Reports otherwise feels well.  Denies other aggravating alleviating factors.  Op[facility]: [facility]  Past Medical History:  Diagnosis Date  . Anxiety   . Arthritis   . Bradycardia   . Bronchitis   . GERD (gastroesophageal reflux disease)   . History of positive PPD 2009  . History of positive PPD 2009   INH through health dept CXR's needed for clearance  . Hyperlipidemia   . Hypertension   . Insomnia   . Obesity   . Rosacea     Patient Active Problem List   Diagnosis Date Noted  . Mild major depression (HCC) 11/19/2016  . Traumatic closed nondisplaced fracture of one rib of right side 08/22/2016  . Osteoarthritis of both hands 08/19/2016  . Fatty liver disease, nonalcoholic 05/30/2016  . Bradycardia 05/20/2016  . Current long-term use of postmenopausal hormone replacement therapy 05/20/2016  . GERD (gastroesophageal reflux disease) 05/20/2016  . Hyperlipidemia, unspecified 05/20/2016  .  Hypertension 05/20/2016  . Insomnia 05/20/2016  . Obesity (BMI 30-39.9) 05/20/2016  . Arthritis 05/20/2016  . Anxiety 05/20/2016  . Rosacea 05/20/2016  . History of hip replacement, total, 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    Past Surgical History:  Procedure Laterality Date  . CERVICAL CONE BIOPSY    . COLONOSCOPY  10/2010  . COLONOSCOPY  2004  . endoscopic carpal tunn Bilateral   . HALLUX VALGUS CORRECTION Right 2005  . HYSTERECTOMY ABDOMINAL WITH SALPINGECTOMY  1981  . INCISION TENDON SHEATH HAND Right   . JOINT REPLACEMENT Right 11/21/2010   knee  . JOINT REPLACEMENT Left 11/04/2011   knee  . JOINT REPLACEMENT Left 04/27/2012   hip   . KNEE ARTHROSCOPY Right   . KNEE ARTHROSCOPY Left 12/18/2011     No current facility-administered medications for this encounter.   Current Outpatient Medications:  .  acetaminophen (TYLENOL) 500 MG tablet, Take 1 tablet (500 mg total) by mouth 2 (two) times daily., Disp: 60 tablet, Rfl: 0 .  atorvastatin (LIPITOR) 40 MG tablet, Take 1 tablet (40 mg total) by mouth daily at 6 PM., Disp: 90 tablet, Rfl: 1 .  diazepam (VALIUM) 5 MG tablet, Take 1 tablet (5 mg total) by mouth daily as needed for anxiety., Disp: 30 tablet, Rfl: 0 .  EPINEPHrine (EPIPEN 2-PAK) 0.3 mg/0.3 mL IJ SOAJ injection, Inject 0.3 mLs (0.3 mg total) into the muscle as needed (anaphylaxis). Then seek medical care (Patient not taking: Reported on 07/20/2017), Disp: 1 Device, Rfl: 0 .  estradiol (ESTRACE)  0.5 MG tablet, Take 1 tablet (0.5 mg total) by mouth daily., Disp: 90 tablet, Rfl: 3 .  losartan (COZAAR) 25 MG tablet, Take 1 tablet (25 mg total) by mouth daily., Disp: 90 tablet, Rfl: 0 .  Multiple Vitamin (MULTI-VITAMINS) TABS, Take by mouth., Disp: , Rfl:  .  naproxen (NAPROSYN) 250 MG tablet, Take 250 mg by mouth as needed., Disp: , Rfl:  .  omeprazole (PRILOSEC) 20 MG capsule, Take 1 capsule (20 mg total) by mouth every other day., Disp: 90 capsule, Rfl: 1  Allergies Sulfa antibiotics  and Penicillins  Family History  Problem Relation Age of Onset  . Alzheimer's disease Mother 108  . Osteoporosis Mother   . Stroke Father 47  . Arthritis Sister   . Basal cell carcinoma Sister   . Macular degeneration Other     Social History Social History   Tobacco Use  . Smoking status: Never Smoker  . Smokeless tobacco: Never Used  Substance Use Topics  . Alcohol use: Yes    Alcohol/week: 1.0 standard drinks    Types: 1 Glasses of wine per week  . Drug use: No    Review of Systems Constitutional: No fever/chills Eyes:As above. Denies vision changes.  ENT: No sore throat. Cardiovascular: Denies chest pain. Respiratory: Denies shortness of breath. Musculoskeletal: Negative for back pain. Skin: Negative for rash.   ____________________________________________   PHYSICAL EXAM:  VITAL SIGNS: ED Triage Vitals  Enc Vitals Group     BP 10/14/17 1735 (!) 151/75     Pulse Rate 10/14/17 1735 60     Resp 10/14/17 1735 18     Temp 10/14/17 1735 98.2 F (36.8 C)     Temp Source 10/14/17 1735 Oral     SpO2 10/14/17 1735 98 %     Weight 10/14/17 1737 178 lb (80.7 kg)     Height 10/14/17 1737 5\' 4"  (1.626 m)     Head Circumference --      Peak Flow --      Pain Score 10/14/17 1737 1     Pain Loc --      Pain Edu? --      Excl. in GC? --     Constitutional: Alert and oriented. Well appearing and in no acute distress. Eyes: Left eye no injection.  Right eye mild injection.  No exudate bilaterally.  No foreign body noted on visual exam.  Right eye examined with tetracaine inferior seen.  Cotton swab utilized to sweep for foreign body and half contact lens removed, no further foreign body found.  No corneal abrasion noted.  Bilateral eyes nontender.  No surrounding tenderness, swelling or erythema bilaterally.  PERRL. EOMI. No pain with EOMs.  ENT      Head: Normocephalic and atraumatic.      Ears: Nontender, cerumen present bilaterally, otherwise no erythema normal TM  bilaterally.      Nose: No congestion/rhinnorhea.      Mouth/Throat: Mucous membranes are moist.Oropharynx non-erythematous. Cardiovascular: Normal rate, regular rhythm. Grossly normal heart sounds.  Good peripheral circulation. Respiratory: Normal respiratory effort without tachypnea nor retractions. Breath sounds are clear and equal bilaterally. No wheezes, rales, rhonchi. Musculoskeletal:  Steady gait. Neurologic:  Normal speech and language. Speech is normal. No gait instability.  Skin:  Skin is warm, dry. Psychiatric: Mood and affect are normal. Speech and behavior are normal. Patient exhibits appropriate insight and judgment   ___________________________________________   LABS (all labs ordered are listed, but only abnormal results are displayed)  Labs Reviewed - No data to display PROCEDURES Procedures   Eye exam Procedure explained and verbal consent obtained.  Anesthesia: tetracaine ophthalmic 2 drops right eye examined with fluorescein strip.  Cotton swab utilized to sweep for foreign body in half contact lens removed.  No further foreign body found.  No corneal abrasion noted.  Patient tolerated well.    INITIAL IMPRESSION / ASSESSMENT AND PLAN / ED COURSE  Pertinent labs & imaging results that were available during my care of the patient were reviewed by me and considered in my medical decision making (see chart for details).  Well-appearing patient.  No acute distress.  Expressed concern of retained contact lens which half of contact lens was found during exam process.  Patient states post contact lens removal irritation fully resolved and eye feels normal.  Patient proceeded to put another contact and and states that the eye completely feels fine.  Patient states that she believes the other half a contact came out at home earlier today.  Discussed very close follow-up and return parameters as well as to follow-up with ophthalmology for any continued sensation of foreign  body, pain or drainage.  Encourage supportive care and avoidance of rubbing.  Discussed follow up and return parameters including no resolution or any worsening concerns. Patient verbalized understanding and agreed to plan.   ____________________________________________   FINAL CLINICAL IMPRESSION(S) / ED DIAGNOSES  Final diagnoses:  Contact lens stuck     ED Discharge Orders    None       Note: This dictation was prepared with Dragon dictation along with smaller phrase technology. Any transcriptional errors that result from this process are unintentional.         Renford Dills, NP 10/14/17 [phone]

## 2017-10-14 NOTE — Discharge Instructions (Addendum)
Follow-up with ophthalmology in 2 to 3 days as discussed for any continued discomfort, as well as any vision changes, redness or drainage.  Return urgent care as needed.

## 2017-10-14 NOTE — ED Triage Notes (Signed)
Pt states she thought she lost her contact in her right eye this a.m. So put a second one it. Has had pain in her eye so took out the contact but still feels like something is in there. Wears mono-vision contacts.   VISUAL ACUITY: Left (corrected but with reading contact) 20/200 Right (uncorrected) 20/200 Both eyes 20/50 -1

## 2017-10-19 DIAGNOSIS — R29898 Other symptoms and signs involving the musculoskeletal system: Secondary | ICD-10-CM | POA: Diagnosis not present

## 2017-10-19 DIAGNOSIS — M25552 Pain in left hip: Secondary | ICD-10-CM | POA: Diagnosis not present

## 2017-10-19 DIAGNOSIS — M47816 Spondylosis without myelopathy or radiculopathy, lumbar region: Secondary | ICD-10-CM | POA: Diagnosis not present

## 2017-10-20 ENCOUNTER — Encounter: Payer: Self-pay | Admitting: Family Medicine

## 2017-10-20 ENCOUNTER — Ambulatory Visit (INDEPENDENT_AMBULATORY_CARE_PROVIDER_SITE_OTHER): Payer: Medicare HMO | Admitting: Family Medicine

## 2017-10-20 VITALS — BP 128/72 | HR 69 | Temp 98.1°F | Resp 14 | Ht 64.0 in | Wt 179.9 lb

## 2017-10-20 DIAGNOSIS — M19041 Primary osteoarthritis, right hand: Secondary | ICD-10-CM

## 2017-10-20 DIAGNOSIS — R69 Illness, unspecified: Secondary | ICD-10-CM | POA: Diagnosis not present

## 2017-10-20 DIAGNOSIS — K219 Gastro-esophageal reflux disease without esophagitis: Secondary | ICD-10-CM | POA: Diagnosis not present

## 2017-10-20 DIAGNOSIS — F325 Major depressive disorder, single episode, in full remission: Secondary | ICD-10-CM | POA: Diagnosis not present

## 2017-10-20 DIAGNOSIS — E782 Mixed hyperlipidemia: Secondary | ICD-10-CM | POA: Diagnosis not present

## 2017-10-20 DIAGNOSIS — F419 Anxiety disorder, unspecified: Secondary | ICD-10-CM

## 2017-10-20 DIAGNOSIS — K760 Fatty (change of) liver, not elsewhere classified: Secondary | ICD-10-CM | POA: Diagnosis not present

## 2017-10-20 DIAGNOSIS — Z23 Encounter for immunization: Secondary | ICD-10-CM

## 2017-10-20 DIAGNOSIS — M19042 Primary osteoarthritis, left hand: Secondary | ICD-10-CM | POA: Diagnosis not present

## 2017-10-20 DIAGNOSIS — I10 Essential (primary) hypertension: Secondary | ICD-10-CM

## 2017-10-20 DIAGNOSIS — R748 Abnormal levels of other serum enzymes: Secondary | ICD-10-CM

## 2017-10-20 MED ORDER — ATORVASTATIN CALCIUM 40 MG PO TABS: 40.0000 mg | ORAL_TABLET | Freq: Every day | ORAL | 1 refills | Status: DC

## 2017-10-20 MED ORDER — LOSARTAN POTASSIUM 50 MG PO TABS: 50.0000 mg | ORAL_TABLET | Freq: Every day | ORAL | 0 refills | Status: DC

## 2017-10-20 NOTE — Progress Notes (Signed)
Name: Kari Lee   MRN: [id number]    DOB: Oct 06, 1945   Date:10/20/2017       Progress Note  Subjective  Chief Complaint  Chief Complaint  Patient presents with  . Follow-up    3 mth f/u  . Hypertension  . Anxiety  . Gastroesophageal Reflux  . Obesity  . Hot Flashes  . elevated liver enzymes  . OA  . Hyperlipidemia  . Medication Refill    diazepam    HPI    HTN she is taking medication and denies side effects. No chest pain or palpitation. BP was low and we went down from 50 mg to 25 of Losartan but bp went up again and she is back on 50 mg again. She denies any dizziness or orthostatic changes  Anxiety: seldom takes Valium, she usually takesat most one per weekShe states symptoms are intermittent, and she takes it and goes to sleep. Last refill was 06/2017, she will check and let me know when she needs more   GERD: she has been takingOmeprazole every other day and is keeping symptoms under control, no heartburn or indigestion. Advised to try going every 2 days without PPI and after that take a couple of times a week to eventually take otc Tums or Pepcid prn.    Obesity: she joined [employer] 05/26/2017, she started at 200.2 lbs and is down to 179.9 lbs. She is happy with results. Continue hard work   Hot Flashes: shesaw Dr. Chauncey Cruel  she was noticing dryness in her face and also hot flashes, she states half dose of estrogen is controlling her symptoms. Stable  Elevated liver enzymes: over 100's , seen by Dr. Lars Pinks, US showed fatty liver, changed diet and lost weight, liver enzymes normalized. Last labs reviewed with patient   OA: most symptoms now on her hands, but has a history of knee replacement and left hip, has DIP deformities and also inability to make a tight fist. She is trying to avoid naproxen and trying to take more Tylenol , also can try topical medication at night   Hyperlipidemia: she is on atorvastatin and is doing well at this time, she  states she is compliant with  Medication Denies myalgia   Major Depression: she has a long history of depression in the past had a lot of lack of motivation and felt hopeless, but currently not having those problems.  This  Summer she states has been struggling with aging, lots of friends diagnosed with cancer and having health issues. She does not like taking medication. She states symptoms now are mild and can cope with it.   Low Back pain: seeing [facility]   Patient Active Problem List   Diagnosis Date Noted  . Mild major depression (HCC) 11/19/2016  . Traumatic closed nondisplaced fracture of one rib of right side 08/22/2016  . Osteoarthritis of both hands 08/19/2016  . Fatty liver disease, nonalcoholic 05/30/2016  . Bradycardia 05/20/2016  . Current long-term use of postmenopausal hormone replacement therapy 05/20/2016  . GERD (gastroesophageal reflux disease) 05/20/2016  . Hyperlipidemia, unspecified 05/20/2016  . Hypertension 05/20/2016  . Insomnia 05/20/2016  . Obesity (BMI 30-39.9) 05/20/2016  . Arthritis 05/20/2016  . Anxiety 05/20/2016  . Rosacea 05/20/2016  . History of hip replacement, total, 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    Past Surgical History:  Procedure Laterality Date  . CERVICAL CONE BIOPSY    . COLONOSCOPY  10/2010  . COLONOSCOPY  2004  . endoscopic carpal tunn Bilateral   .  HALLUX VALGUS CORRECTION Right 2005  . HYSTERECTOMY ABDOMINAL WITH SALPINGECTOMY  1981  . INCISION TENDON SHEATH HAND Right   . JOINT REPLACEMENT Right 11/21/2010   knee  . JOINT REPLACEMENT Left 11/04/2011   knee  . JOINT REPLACEMENT Left 04/27/2012   hip   . KNEE ARTHROSCOPY Right   . KNEE ARTHROSCOPY Left 12/18/2011    Family History  Problem Relation Age of Onset  . Alzheimer's disease Mother 6475  . Osteoporosis Mother   . Stroke Father [id number]  . Arthritis Sister   . Basal cell carcinoma Sister   . Macular degeneration Other     Social History   Socioeconomic History  . Marital  status: Married    Spouse name: Maisie Fushomas  . Number of children: 2  . Years of education: Not on file  . Highest education level: Associate degree: occupational, Scientist, product/process developmenttechnical, or vocational program  Occupational History  . Occupation: Retired    Comment: Engineer, civil (consulting)urse  Social Needs  . Financial resource strain: Not hard at all  . Food insecurity:    Worry: Never true    Inability: Never true  . Transportation needs:    Medical: No    Non-medical: No  Tobacco Use  . Smoking status: Never Smoker  . Smokeless tobacco: Never Used  Substance and Sexual Activity  . Alcohol use: Yes    Alcohol/week: 1.0 standard drinks    Types: 1 Glasses of wine per week  . Drug use: No  . Sexual activity: Yes    Partners: Male    Birth control/protection: None  Lifestyle  . Physical activity:    Days per week: 3 days    Minutes per session: 30 min  . Stress: Not at all  Relationships  . Social connections:    Talks on phone: More than three times a week    Gets together: Twice a week    Attends religious service: More than 4 times per year    Active member of club or organization: Yes    Attends meetings of clubs or organizations: More than 4 times per year    Relationship status: Married  . Intimate partner violence:    Fear of current or ex partner: No    Emotionally abused: No    Physically abused: No    Forced sexual activity: No  Other Topics Concern  . Not on file  Social History Narrative   She moved to [place] to downsize, but [place].    Married.   One daughter lives in [place].    [place] in [place].     Current Outpatient Medications:  .  acetaminophen (TYLENOL) 500 MG tablet, Take 1 tablet (500 mg total) by mouth 2 (two) times daily., Disp: 60 tablet, Rfl: 0 .  atorvastatin (LIPITOR) 40 MG tablet, Take 1 tablet (40 mg total) by mouth daily at 6 PM., Disp: 90 tablet, Rfl: 1 .  diazepam (VALIUM) 5 MG tablet, Take 1 tablet (5 mg  total) by mouth daily as needed for anxiety., Disp: 30 tablet, Rfl: 0 .  estradiol (ESTRACE) 0.5 MG tablet, Take 1 tablet (0.5 mg total) by mouth daily., Disp: 90 tablet, Rfl: 3 .  losartan (COZAAR) 25 MG tablet, Take 1 tablet (25 mg total) by mouth daily., Disp: 90 tablet, Rfl: 0 .  Multiple Vitamin (MULTI-VITAMINS) TABS, Take by mouth., Disp: , Rfl:  .  naproxen (NAPROSYN) 250 MG tablet, Take 250 mg by mouth  as needed., Disp: , Rfl:  .  omeprazole (PRILOSEC) 20 MG capsule, Take 1 capsule (20 mg total) by mouth every other day., Disp: 90 capsule, Rfl: 1 .  EPINEPHrine (EPIPEN 2-PAK) 0.3 mg/0.3 mL IJ SOAJ injection, Inject 0.3 mLs (0.3 mg total) into the muscle as needed (anaphylaxis). Then seek medical care (Patient not taking: Reported on 07/20/2017), Disp: 1 Device, Rfl: 0  Allergies  Allergen Reactions  . Sulfa Antibiotics Nausea Only  . Penicillins Rash    I personally reviewed active problem list, medication list, allergies, family history, social history with the patient/caregiver today.   ROS  Constitutional: Negative for fever, positive for  weight change.  Respiratory: Negative for cough and shortness of breath.   Cardiovascular: Negative for chest pain or palpitations.  Gastrointestinal: Negative for abdominal pain, no bowel changes.  Musculoskeletal: Negative for gait problem or joint swelling.  Skin: Negative for rash.  Neurological: Negative for dizziness or headache.  No other specific complaints in a complete review of systems (except as listed in HPI above).  Objective  Vitals:   10/20/17 1032  BP: 128/72  Pulse: 69  Resp: 14  Temp: 98.1 F (36.7 C)  TempSrc: Oral  SpO2: 96%  Weight: 179 lb 14.4 oz (81.6 kg)  Height: 5\' 4"  (1.626 m)    Body mass index is 30.88 kg/m.  Physical Exam  Constitutional: Patient appears well-developed and well-nourished. Obese No distress.  HEENT: head atraumatic, normocephalic, pupils equal and reactive to light,  neck  supple, throat within normal limits Cardiovascular: Normal rate, regular rhythm and normal heart sounds.  No murmur heard. No BLE edema. Pulmonary/Chest: Effort normal and breath sounds normal. No respiratory distress. Abdominal: Soft.  There is no tenderness. Psychiatric: Patient has a normal mood and affect. behavior is normal. Judgment and thought content normal.    PHQ2/9: Depression screen [facility] 2/9 10/20/2017 10/20/2017 07/20/2017 11/19/2016 08/19/2016  Decreased Interest 1 1 0 1 0  Down, Depressed, Hopeless 1 1 0 1 0  PHQ - 2 Score 2 2 0 2 0  Altered sleeping 0 0 0 1 -  Tired, decreased energy 1 1 0 1 -  Change in appetite 0 0 0 1 -  Feeling bad or failure about yourself  0 0 0 1 -  Trouble concentrating 0 0 0 1 -  Moving slowly or fidgety/restless 0 0 0 0 -  Suicidal thoughts 0 0 0 0 -  PHQ-9 Score 3 3 0 7 -  Difficult doing work/chores Not difficult at all Not difficult at all Not difficult at all Somewhat difficult -     Fall Risk: Fall Risk  10/20/2017 07/20/2017 03/20/2017 11/19/2016 08/19/2016  Falls in the past year? No Yes No Yes No  Number falls in past yr: - 1 - 1 -  Injury with Fall? - Yes - Yes -  Comment - slipped on ketchup in a restaurant- fracture ribs - - -  Follow up - - - Education provided -     Functional Status Survey: Is the patient deaf or have difficulty hearing?: Yes Does the patient have difficulty seeing, even when wearing glasses/contacts?: No Does the patient have difficulty concentrating, remembering, or making decisions?: No Does the patient have difficulty walking or climbing stairs?: No Does the patient have difficulty dressing or bathing?: No Does the patient have difficulty doing errands alone such as visiting a doctor's office or shopping?: No   Assessment & Plan  1. Essential hypertension  - losartan (COZAAR)  50 MG tablet; Take 1 tablet (50 mg total) by mouth daily.  Dispense: 90 tablet; Refill: 0  2. Need for influenza  vaccination  - Flu vaccine HIGH DOSE PF  3. Major depression in remission (HCC)  Very mild symptoms now  4. Primary osteoarthritis of both hands  Advised again to avoid nsaid's orally   5. Mixed hyperlipidemia  - atorvastatin (LIPITOR) 40 MG tablet; Take 1 tablet (40 mg total) by mouth daily at 6 PM.  Dispense: 90 tablet; Refill: 1  6. Abnormal liver enzymes  Normalized, check intermittently   7. Anxiety  She will call when she needs a refill  8. Fatty liver  No symptoms  9. GERD without esophagitis  Advised to try stopping medication

## 2017-11-02 DIAGNOSIS — M25552 Pain in left hip: Secondary | ICD-10-CM | POA: Diagnosis not present

## 2017-11-03 ENCOUNTER — Ambulatory Visit
Admission: RE | Admit: 2017-11-03 | Discharge: 2017-11-03 | Disposition: A | Discharge status: Home / Self Care | Payer: Medicare HMO | Source: Ambulatory Visit | Attending: Family Medicine | Admitting: Family Medicine

## 2017-11-03 DIAGNOSIS — E2839 Other primary ovarian failure: Secondary | ICD-10-CM | POA: Diagnosis not present

## 2018-01-28 IMAGING — US US ABDOMEN LIMITED
1 series · 14 of 25 positions shown · non-contrast
Comparison: No recent.

CLINICAL DATA: Elevated liver enzymes.

EXAM:
US ABDOMEN LIMITED - RIGHT UPPER QUADRANT

[Series 1: us abdomen limited · 0.20mm/px · 14 of 39 slices shown]
[im 1/39]
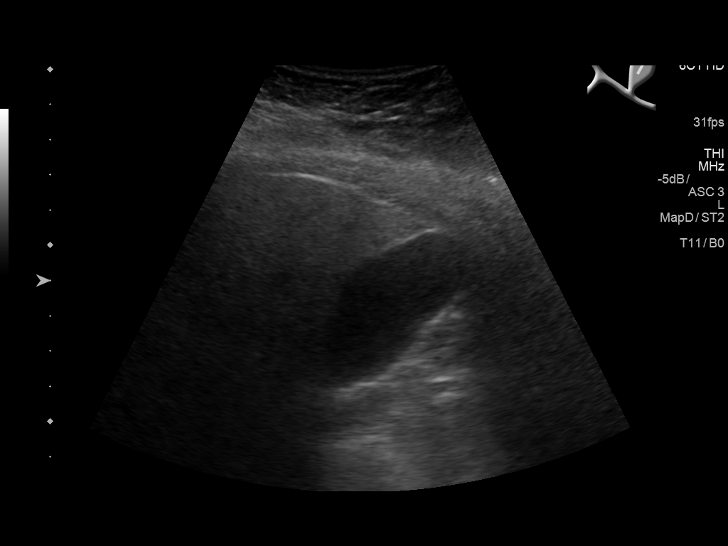
[im 4/39]
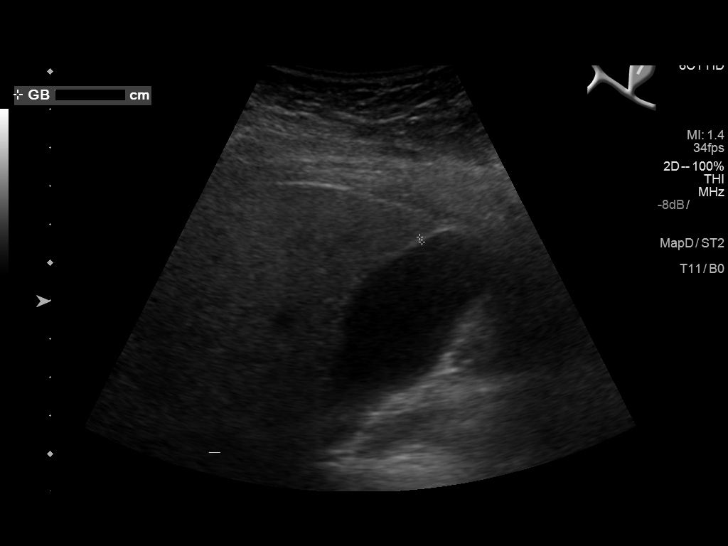
[im 7/39]
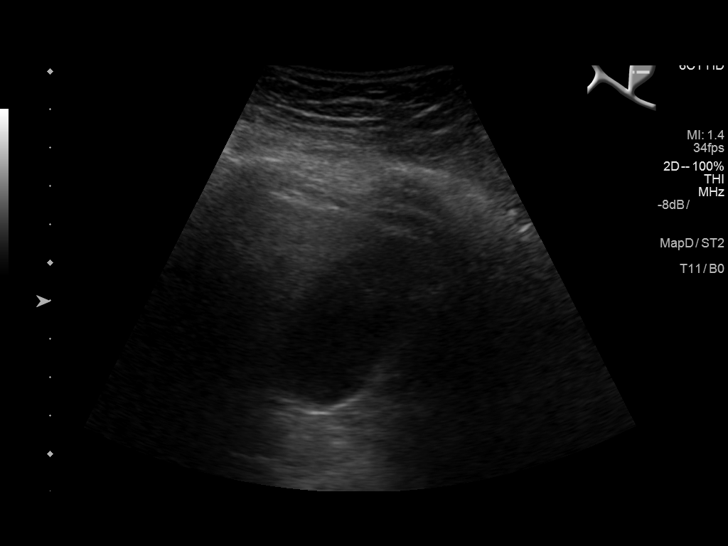
[im 10/39]
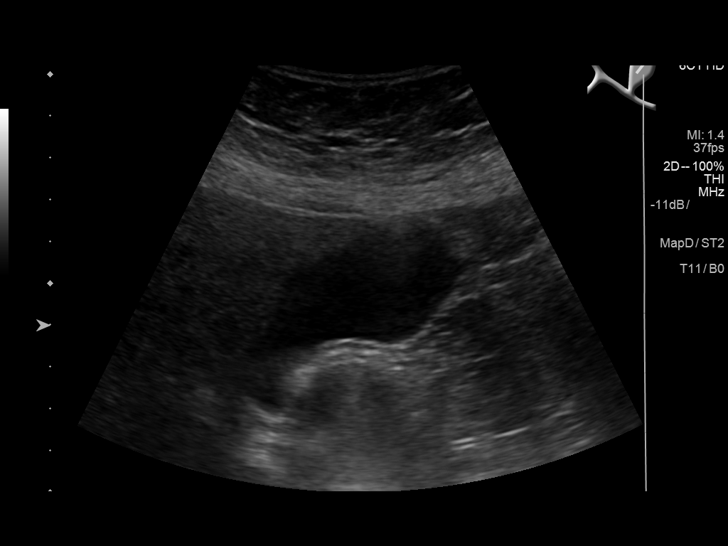
[im 13/39]
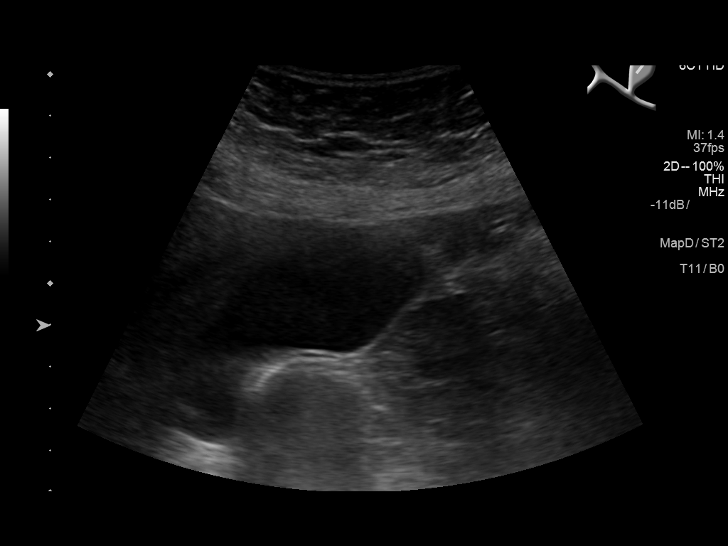
[im 15/39]
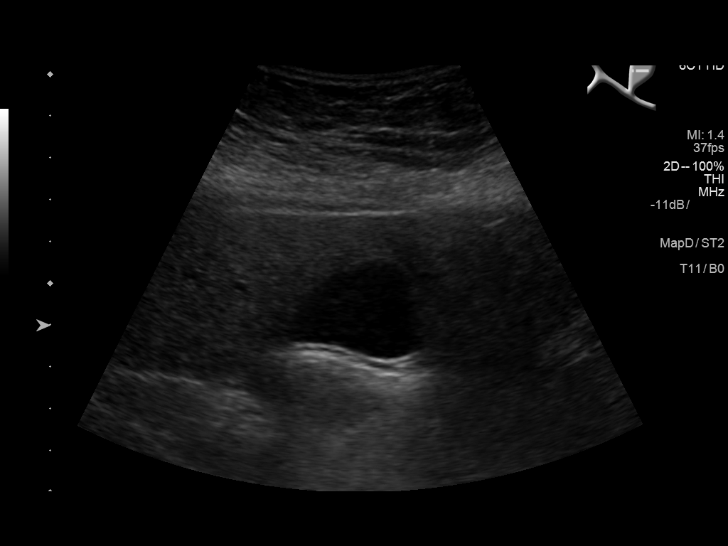
[im 18/39]
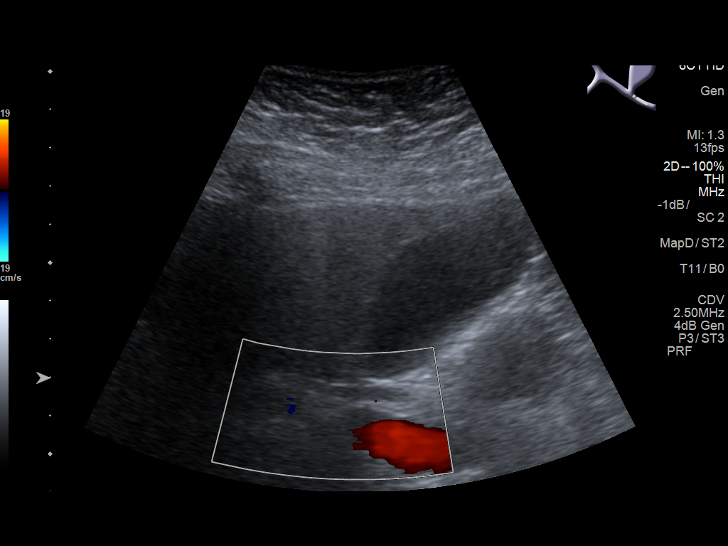
[im 21/39]
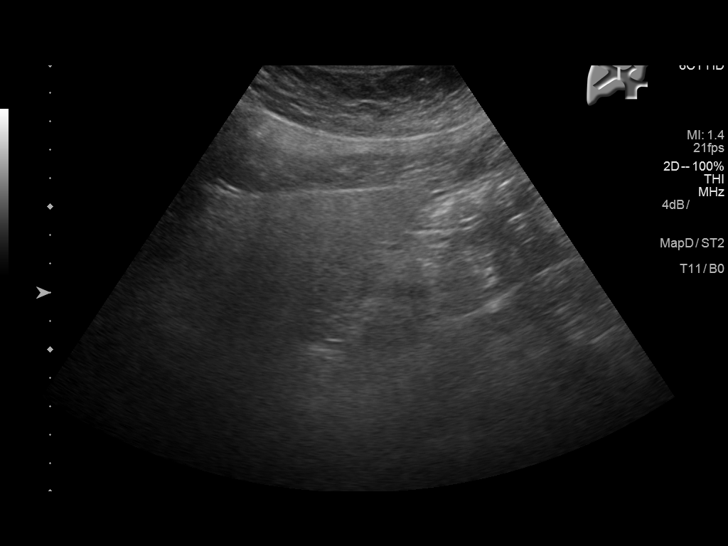
[im 24/39]
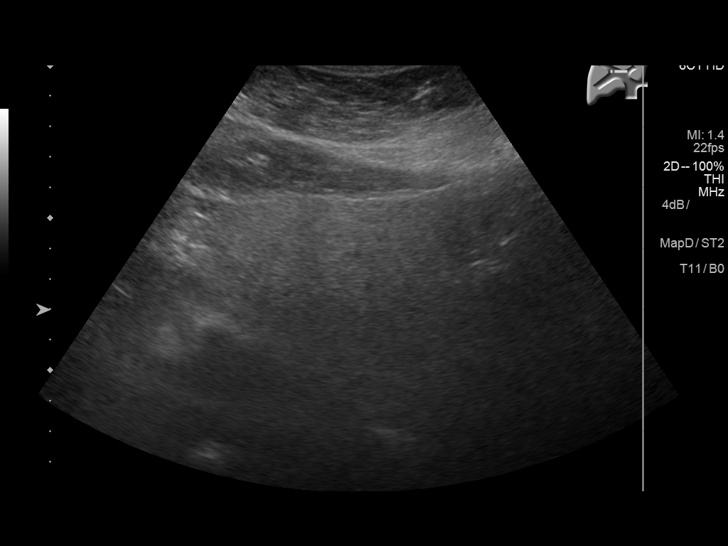
[im 26/39]
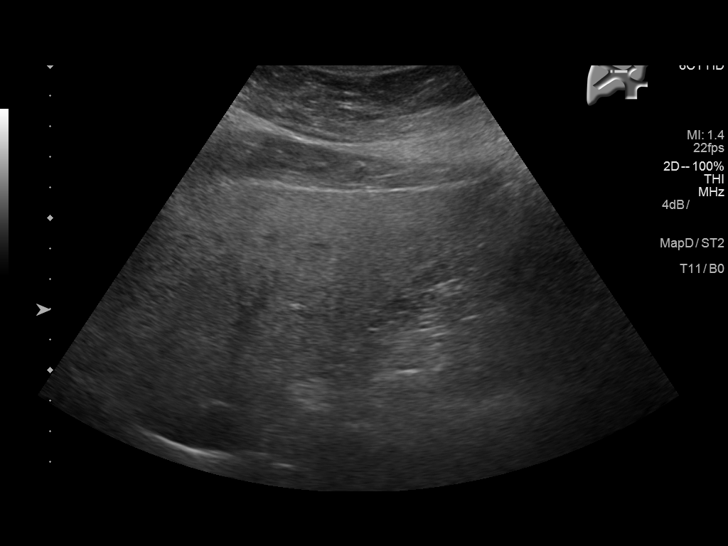
[im 29/39]
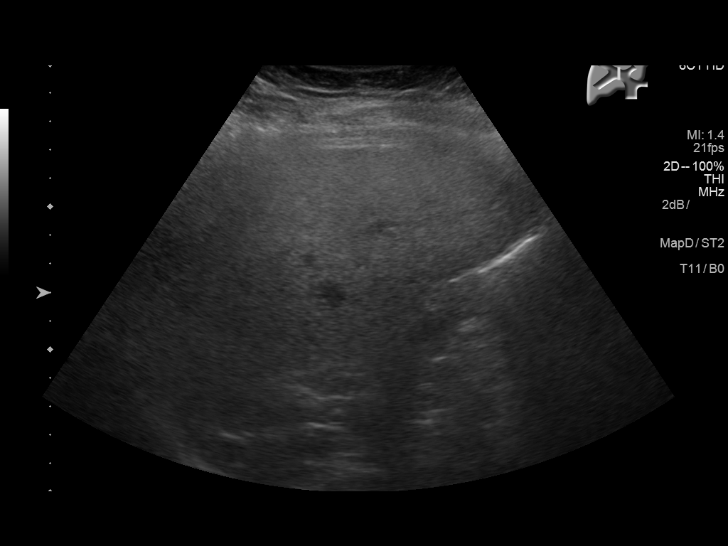
[im 32/39]
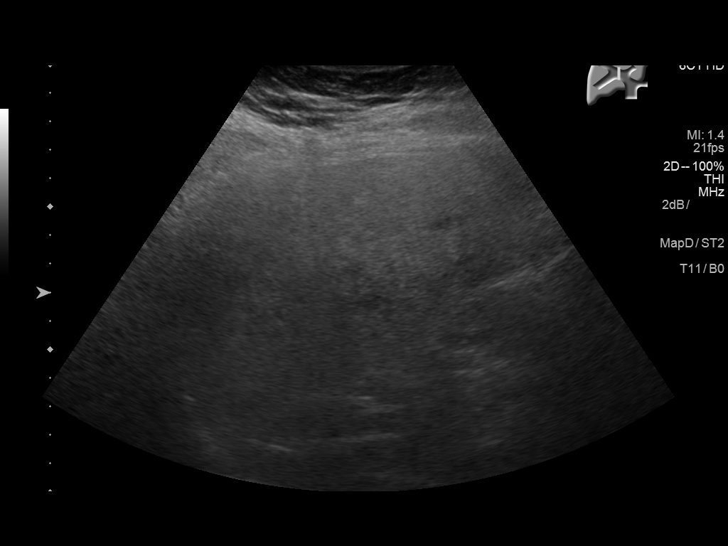
[im 35/39]
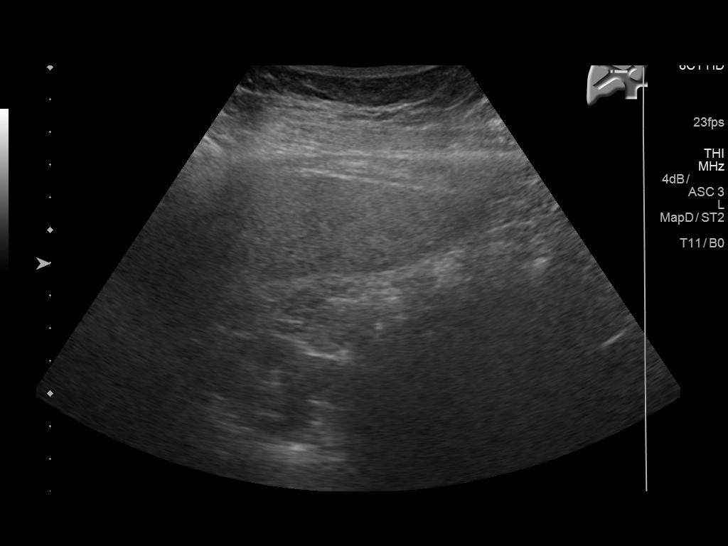
[im 39/39]
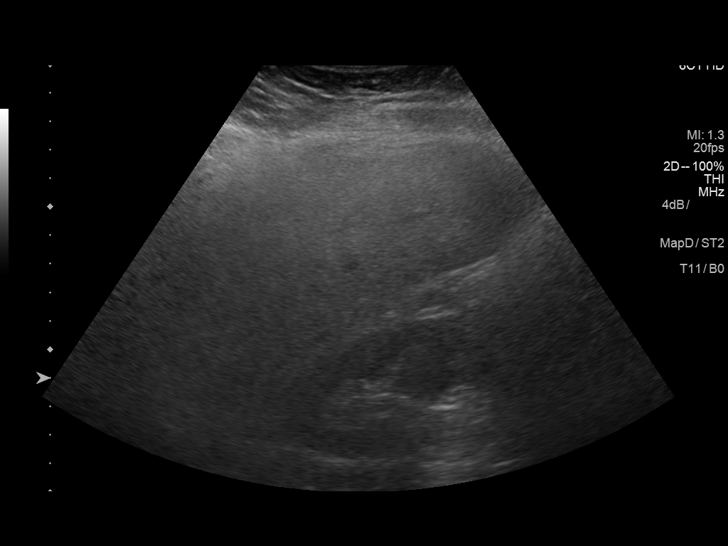

[14 of 25 positions shown; findings below may reference images not displayed]

FINDINGS: Gallbladder:

No gallstones or wall thickening visualized. No sonographic Murphy
sign noted by sonographer.

Common bile duct:

Diameter: 3.2 mm

Liver:

Increase echogenicity consistent fatty infiltration and/or
hepatocellular disease. No focal hepatic abnormality identified.
Portal vein patent.
IMPRESSION: Increased hepatic echogenicity consistent fatty infiltration and/or
hepatocellular disease. No focal abnormality identified . No
gallstones or biliary distention .

## 2018-02-19 ENCOUNTER — Ambulatory Visit (INDEPENDENT_AMBULATORY_CARE_PROVIDER_SITE_OTHER): Payer: Medicare HMO | Admitting: Family Medicine

## 2018-02-19 ENCOUNTER — Encounter: Payer: Self-pay | Admitting: Family Medicine

## 2018-02-19 ENCOUNTER — Ambulatory Visit (INDEPENDENT_AMBULATORY_CARE_PROVIDER_SITE_OTHER): Payer: Medicare HMO

## 2018-02-19 VITALS — BP 126/82 | HR 56 | Temp 97.5°F | Resp 16 | Ht 64.0 in | Wt 173.5 lb

## 2018-02-19 DIAGNOSIS — M19041 Primary osteoarthritis, right hand: Secondary | ICD-10-CM | POA: Diagnosis not present

## 2018-02-19 DIAGNOSIS — R198 Other specified symptoms and signs involving the digestive system and abdomen: Secondary | ICD-10-CM

## 2018-02-19 DIAGNOSIS — R69 Illness, unspecified: Secondary | ICD-10-CM | POA: Diagnosis not present

## 2018-02-19 DIAGNOSIS — Z1231 Encounter for screening mammogram for malignant neoplasm of breast: Secondary | ICD-10-CM | POA: Diagnosis not present

## 2018-02-19 DIAGNOSIS — R001 Bradycardia, unspecified: Secondary | ICD-10-CM

## 2018-02-19 DIAGNOSIS — K760 Fatty (change of) liver, not elsewhere classified: Secondary | ICD-10-CM

## 2018-02-19 DIAGNOSIS — Z0000 Encounter for general adult medical examination without abnormal findings: Secondary | ICD-10-CM

## 2018-02-19 DIAGNOSIS — F325 Major depressive disorder, single episode, in full remission: Secondary | ICD-10-CM | POA: Diagnosis not present

## 2018-02-19 DIAGNOSIS — E782 Mixed hyperlipidemia: Secondary | ICD-10-CM

## 2018-02-19 DIAGNOSIS — M19042 Primary osteoarthritis, left hand: Secondary | ICD-10-CM

## 2018-02-19 DIAGNOSIS — K219 Gastro-esophageal reflux disease without esophagitis: Secondary | ICD-10-CM | POA: Diagnosis not present

## 2018-02-19 DIAGNOSIS — I10 Essential (primary) hypertension: Secondary | ICD-10-CM

## 2018-02-19 LAB — COMPLETE METABOLIC PANEL WITH GFR
AG Ratio: 1.9 (calc) (ref 1.0–2.5)
ALBUMIN MSPROF: 4.6 g/dL (ref 3.6–5.1)
ALT: 24 U/L (ref 6–29)
AST: 22 U/L (ref 10–35)
Alkaline phosphatase (APISO): 70 U/L (ref 33–130)
BUN: 15 mg/dL (ref 7–25)
CALCIUM: 9.8 mg/dL (ref 8.6–10.4)
CO2: 31 mmol/L (ref 20–32)
Chloride: 103 mmol/L (ref 98–110)
Creat: 0.68 mg/dL (ref 0.60–0.93)
GFR, Est African American: 103 mL/min/{1.73_m2} (ref >=60)
GFR, Est Non African American: 89 mL/min/{1.73_m2} (ref >=60)
GLOBULIN: 2.4 g/dL (ref 1.9–3.7)
Glucose, Bld: 91 mg/dL (ref 65–99)
Potassium: 4.8 mmol/L (ref 3.5–5.3)
SODIUM: 139 mmol/L (ref 135–146)
Total Bilirubin: 0.8 mg/dL (ref 0.2–1.2)
Total Protein: 7 g/dL (ref 6.1–8.1)

## 2018-02-19 LAB — CBC WITH DIFFERENTIAL/PLATELET
Absolute Monocytes: 528 cells/uL (ref 200–950)
BASOS PCT: 0.7 %
Basophils Absolute: 41 cells/uL (ref 0–200)
EOS ABS: 278 {cells}/uL (ref 15–500)
Eosinophils Relative: 4.8 %
HCT: 42.8 % (ref 35.0–45.0)
HEMOGLOBIN: 14.9 g/dL (ref 11.7–15.5)
Lymphs Abs: 1902 cells/uL (ref 850–3900)
MCH: 31.1 pg (ref 27.0–33.0)
MCHC: 34.8 g/dL (ref 32.0–36.0)
MCV: 89.4 fL (ref 80.0–100.0)
MPV: 11.2 fL (ref 7.5–12.5)
Monocytes Relative: 9.1 %
Neutro Abs: 3051 cells/uL (ref 1500–7800)
Neutrophils Relative %: 52.6 %
PLATELETS: 199 10*3/uL (ref 140–400)
RBC: 4.79 10*6/uL (ref 3.80–5.10)
RDW: 12.5 % (ref 11.0–15.0)
TOTAL LYMPHOCYTE: 32.8 %
WBC: 5.8 10*3/uL (ref 3.8–10.8)

## 2018-02-19 LAB — TSH: TSH: 1.54 mIU/L (ref 0.40–4.50)

## 2018-02-19 MED ORDER — LOSARTAN POTASSIUM 50 MG PO TABS: 50.0000 mg | ORAL_TABLET | Freq: Every day | ORAL | 1 refills | Status: DC

## 2018-02-19 NOTE — Patient Instructions (Signed)
[name] , Thank you for taking time to come for your Medicare Wellness Visit. I appreciate your ongoing commitment to your health goals. Please review the following plan we discussed and let me know if I can assist you in the future.   Screening recommendations/referrals: Colonoscopy: done 10/28/10. Repeat in 2022 Mammogram: done 01/15/17. Please call [phone] to schedule your mammogram.  Bone Density: done 11/03/17 Recommended yearly ophthalmology/optometry visit for glaucoma screening and checkup Recommended yearly dental visit for hygiene and checkup  Vaccinations: Influenza vaccine: done 10/20/17 Pneumococcal vaccine: done 11/19/16 Tdap vaccine: done 08/13/09 repeat in 2021 Shingles vaccine: Shingrix discussed. Please contact your pharmacy for coverage information.     Advanced directives: Advance directive discussed with you today. I have provided a copy for you to complete at home and have notarized. Once this is complete please bring a copy in to our office so we can scan it into your chart.  Conditions/risks identified: Continue healthy eating and physical activity for desired weight loss.   Next appointment: Please follow up in one year for your Medicare Annual Wellness visit.     Preventive Care 65 Years and Older, Female Preventive care refers to lifestyle choices and visits with your health care provider that can promote health and wellness. What does preventive care include?  A yearly physical exam. This is also called an annual well check.  Dental exams once or twice a year.  Routine eye exams. Ask your health care provider how often you should have your eyes checked.  Personal lifestyle choices, including:  Daily care of your teeth and gums.  Regular physical activity.  Eating a healthy diet.  Avoiding tobacco and drug use.  Limiting alcohol use.  Practicing safe sex.  Taking low-dose aspirin every day.  Taking vitamin and mineral supplements as  recommended by your health care provider. What happens during an annual well check? The services and screenings done by your health care provider during your annual well check will depend on your age, overall health, lifestyle risk factors, and family history of disease. Counseling  Your health care provider may ask you questions about your:  Alcohol use.  Tobacco use.  Drug use.  Emotional well-being.  Home and relationship well-being.  Sexual activity.  Eating habits.  History of falls.  Memory and ability to understand (cognition).  Work and work Astronomer.  Reproductive health. Screening  You may have the following tests or measurements:  Height, weight, and BMI.  Blood pressure.  Lipid and cholesterol levels. These may be checked every 5 years, or more frequently if you are over 27 years old.  Skin check.  Lung cancer screening. You may have this screening every year starting at age 47 if you have a 30-pack-year history of smoking and currently smoke or have quit within the past 15 years.  Fecal occult blood test (FOBT) of the stool. You may have this test every year starting at age 45.  Flexible sigmoidoscopy or colonoscopy. You may have a sigmoidoscopy every 5 years or a colonoscopy every 10 years starting at age 36.  Hepatitis C blood test.  Hepatitis B blood test.  Sexually transmitted disease (STD) testing.  Diabetes screening. This is done by checking your blood sugar (glucose) after you have not eaten for a while (fasting). You may have this done every 1-3 years.  Bone density scan. This is done to screen for osteoporosis. You may have this done starting at age 86.  Mammogram. This may be done every  1-2 years. Talk to your health care provider about how often you should have regular mammograms. Talk with your health care provider about your test results, treatment options, and if necessary, the need for more tests. Vaccines  Your health care  provider may recommend certain vaccines, such as:  Influenza vaccine. This is recommended every year.  Tetanus, diphtheria, and acellular pertussis (Tdap, Td) vaccine. You may need a Td booster every 10 years.  Zoster vaccine. You may need this after age 78.  Pneumococcal 13-valent conjugate (PCV13) vaccine. One dose is recommended after age 69.  Pneumococcal polysaccharide (PPSV23) vaccine. One dose is recommended after age 31. Talk to your health care provider about which screenings and vaccines you need and how often you need them. This information is not intended to replace advice given to you by your health care provider. Make sure you discuss any questions you have with your health care provider. Document Released: 02/09/2015 Document Revised: 10/03/2015 Document Reviewed: 11/14/2014 Elsevier Interactive Patient Education  2017 [place] Prevention in the Home Falls can cause injuries. They can happen to people of all ages. There are many things you can do to make your home safe and to help prevent falls. What can I do on the outside of my home?  Regularly fix the edges of walkways and driveways and fix any cracks.  Remove anything that might make you trip as you walk through a door, such as a raised step or threshold.  Trim any bushes or trees on the path to your home.  Use bright outdoor lighting.  Clear any walking paths of anything that might make someone trip, such as rocks or tools.  Regularly check to see if handrails are loose or broken. Make sure that both sides of any steps have handrails.  Any raised decks and porches should have guardrails on the edges.  Have any leaves, snow, or ice cleared regularly.  Use sand or salt on walking paths during winter.  Clean up any spills in your garage right away. This includes oil or grease spills. What can I do in the bathroom?  Use night lights.  Install grab bars by the toilet and in the tub and shower. Do  not use towel bars as grab bars.  Use non-skid mats or decals in the tub or shower.  If you need to sit down in the shower, use a plastic, non-slip stool.  Keep the floor dry. Clean up any water that spills on the floor as soon as it happens.  Remove soap buildup in the tub or shower regularly.  Attach bath mats securely with double-sided non-slip rug tape.  Do not have throw rugs and other things on the floor that can make you trip. What can I do in the bedroom?  Use night lights.  Make sure that you have a light by your bed that is easy to reach.  Do not use any sheets or blankets that are too big for your bed. They should not hang down onto the floor.  Have a firm chair that has side arms. You can use this for support while you get dressed.  Do not have throw rugs and other things on the floor that can make you trip. What can I do in the kitchen?  Clean up any spills right away.  Avoid walking on wet floors.  Keep items that you use a lot in easy-to-reach places.  If you need to reach something above you, use a strong  step stool that has a grab bar.  Keep electrical cords out of the way.  Do not use floor polish or wax that makes floors slippery. If you must use wax, use non-skid floor wax.  Do not have throw rugs and other things on the floor that can make you trip. What can I do with my stairs?  Do not leave any items on the stairs.  Make sure that there are handrails on both sides of the stairs and use them. Fix handrails that are broken or loose. Make sure that handrails are as long as the stairways.  Check any carpeting to make sure that it is firmly attached to the stairs. Fix any carpet that is loose or worn.  Avoid having throw rugs at the top or bottom of the stairs. If you do have throw rugs, attach them to the floor with carpet tape.  Make sure that you have a light switch at the top of the stairs and the bottom of the stairs. If you do not have them,  ask someone to add them for you. What else can I do to help prevent falls?  Wear shoes that:  Do not have high heels.  Have rubber bottoms.  Are comfortable and fit you well.  Are closed at the toe. Do not wear sandals.  If you use a stepladder:  Make sure that it is fully opened. Do not climb a closed stepladder.  Make sure that both sides of the stepladder are locked into place.  Ask someone to hold it for you, if possible.  Clearly mark and make sure that you can see:  Any grab bars or handrails.  First and last steps.  Where the edge of each step is.  Use tools that help you move around (mobility aids) if they are needed. These include:  Canes.  Walkers.  Scooters.  Crutches.  Turn on the lights when you go into a dark area. Replace any light bulbs as soon as they burn out.  Set up your furniture so you have a clear path. Avoid moving your furniture around.  If any of your floors are uneven, fix them.  If there are any pets around you, be aware of where they are.  Review your medicines with your doctor. Some medicines can make you feel dizzy. This can increase your chance of falling. Ask your doctor what other things that you can do to help prevent falls. This information is not intended to replace advice given to you by your health care provider. Make sure you discuss any questions you have with your health care provider. Document Released: 11/09/2008 Document Revised: 06/21/2015 Document Reviewed: 02/17/2014 Elsevier Interactive Patient Education  2017 [employer].

## 2018-02-19 NOTE — Progress Notes (Signed)
Subjective:   Kari Lee is a 73 y.o. female who presents for Medicare Annual (Subsequent) preventive examination.  Review of Systems:   Cardiac Risk Factors include: advanced age (>[id number], 69>65 women);dyslipidemia;hypertension     Objective:     Vitals: BP 126/82 (BP Location: Right Arm, Patient Position: Sitting, Cuff Size: Normal)   Pulse (!) 56   Temp (!) 97.5 F (36.4 C) (Oral)   Resp 16   Ht 5\' 4"  (1.626 m)   Wt 173 lb 8 oz (78.7 kg)   SpO2 99%   BMI 29.78 kg/m   Body mass index is 29.78 kg/m.  Advanced Directives 02/19/2018 10/14/2017 06/25/2017 11/19/2016 09/23/2016 08/22/2016 08/19/2016  Does Patient Have a Medical Advance Directive? Yes Yes Yes Yes No Yes Yes  Type of Estate agentAdvance Directive Healthcare Power of [place];Living will Healthcare Power of [place];Living will Living will Healthcare Power of Attorney - Living will Living will  Copy of Healthcare Power of Attorney in Chart? No - copy requested - - Yes - - -    Tobacco Social History   Tobacco Use  Smoking Status Never Smoker  Smokeless Tobacco Never Used     Counseling given: Not Answered   Clinical Intake:  Pre-visit preparation completed: Yes  Pain : No/denies pain     Nutritional Status: BMI 25 -29 Overweight Nutritional Risks: None Diabetes: No  How often do you need to have someone help you when you read instructions, pamphlets, or other written materials from your doctor or pharmacy?: 1 - Never What is the last grade level you completed in school?: Associates degree  Interpreter Needed?: No  Information entered by :: Reather LittlerKasey Salathiel Ferrara LPN  Past Medical History:  Diagnosis Date  . Anxiety   . Arthritis   . Bradycardia   . Bronchitis   . GERD (gastroesophageal reflux disease)   . History of positive PPD 2009  . History of positive PPD 2009   INH through health dept CXR's needed for clearance  . Hyperlipidemia   . Hypertension   . Insomnia   . Lumbar spondylosis    Managed by Dr[facility]  Kennedy BuckerMichael Menz [facility]  . Obesity   . Rosacea    Past Surgical History:  Procedure Laterality Date  . CERVICAL CONE BIOPSY    . COLONOSCOPY  10/2010  . COLONOSCOPY  2004  . endoscopic carpal tunn Bilateral   . HALLUX VALGUS CORRECTION Right 2005  . HYSTERECTOMY ABDOMINAL WITH SALPINGECTOMY  1981  . INCISION TENDON SHEATH HAND Right   . JOINT REPLACEMENT Right 11/21/2010   knee  . JOINT REPLACEMENT Left 11/04/2011   knee  . JOINT REPLACEMENT Left 04/27/2012   hip   . KNEE ARTHROSCOPY Right   . KNEE ARTHROSCOPY Left 12/18/2011   Family History  Problem Relation Age of Onset  . Alzheimer's disease Mother [id number]  . Osteoporosis Mother   . Stroke Father [id number]  . Arthritis Sister   . Basal cell carcinoma Sister   . Macular degeneration Other    Social History   Socioeconomic History  . Marital status: Married    Spouse name: Maisie Fushomas  . Number of children: 2  . Years of education: Not on file  . Highest education level: Associate degree: occupational, Scientist, product/process developmenttechnical, or vocational program  Occupational History  . Occupation: Retired    Comment: Engineer, civil (consulting)urse  Social Needs  . Financial resource strain: Not hard at all  . Food insecurity:    Worry: Never true    Inability:  Never true  . Transportation needs:    Medical: No    Non-medical: No  Tobacco Use  . Smoking status: Never Smoker  . Smokeless tobacco: Never Used  Substance and Sexual Activity  . Alcohol use: Yes    Alcohol/week: 1.0 standard drinks    Types: 1 Glasses of wine per week  . Drug use: No  . Sexual activity: Yes    Partners: Male    Birth control/protection: None, Surgical, Post-menopausal  Lifestyle  . Physical activity:    Days per week: 3 days    Minutes per session: 30 min  . Stress: Not at all  Relationships  . Social connections:    Talks on phone: More than three times a week    Gets together: Twice a week    Attends religious service: More than 4 times per year    Active member of club or  organization: Yes    Attends meetings of clubs or organizations: More than 4 times per year    Relationship status: Married  Other Topics Concern  . Not on file  Social History Narrative   She moved to [place] to downsize, but decided to move back to [place] after 2 years.    Married.   One daughter lives in [place].    The other daughter in [place].    Outpatient Encounter Medications as of 02/19/2018  Medication Sig  . acetaminophen (TYLENOL) 500 MG tablet Take 1 tablet (500 mg total) by mouth 2 (two) times daily.  Marland Kitchen atorvastatin (LIPITOR) 40 MG tablet Take 1 tablet (40 mg total) by mouth daily at 6 PM.  . estradiol (ESTRACE) 0.5 MG tablet Take 1 tablet (0.5 mg total) by mouth daily.  Marland Kitchen losartan (COZAAR) 50 MG tablet Take 1 tablet (50 mg total) by mouth daily.  . Melatonin 10 MG TABS Take 1 tablet by mouth at bedtime as needed.  . Multiple Vitamin (MULTI-VITAMINS) TABS Take by mouth.  . naproxen (NAPROSYN) 250 MG tablet Take 250 mg by mouth as needed.  Marland Kitchen omeprazole (PRILOSEC) 20 MG capsule Take 1 capsule (20 mg total) by mouth every other day.  . diazepam (VALIUM) 5 MG tablet Take 1 tablet (5 mg total) by mouth daily as needed for anxiety. (Patient not taking: Reported on 02/19/2018)  . [DISCONTINUED] EPINEPHrine (EPIPEN 2-PAK) 0.3 mg/0.3 mL IJ SOAJ injection Inject 0.3 mLs (0.3 mg total) into the muscle as needed (anaphylaxis). Then seek medical care (Patient not taking: Reported on 07/20/2017)   No facility-administered encounter medications on file as of 02/19/2018.     Activities of Daily Living In your present state of health, do you have any difficulty performing the following activities: 02/19/2018 10/20/2017  Hearing? Malvin Johns  Comment qualifies for hearing aids but does not have them due to cost -  Vision? N N  Comment wears contacts -  Difficulty concentrating or making decisions? N N  Walking or climbing stairs? N N  Dressing or bathing? N N  Doing errands, shopping? N N    Preparing Food and eating ? N -  Using the Toilet? N -  In the past six months, have you accidently leaked urine? Y -  Comment stress incontinence, wears liners for protection -  Do you have problems with loss of bowel control? N -  Managing your Medications? N -  Managing your Finances? N -  Housekeeping or managing your Housekeeping? N -  Some recent data might be hidden    Patient Care  Team: Alba Cory, MD as PCP - General (Family Medicine) Midge Minium, MD as Consulting Physician (Gastroenterology) Kennedy Bucker, MD as Consulting Physician (Orthopedic Surgery) Alwyn Pea, MD as Consulting Physician (Cardiology)    Assessment:   This is a routine wellness examination for [place].  Exercise Activities and Dietary recommendations Current Exercise Habits: Home exercise routine, Type of exercise: walking;treadmill, Time (Minutes): 30, Frequency (Times/Week): 3, Weekly Exercise (Minutes/Week): 90, Intensity: Mild, Exercise limited by: None identified  Goals    . Weight (lb) < 200 lb (90.7 kg)     Pt would like to lose 20 lbs this year and increase physical activity.        Fall Risk Fall Risk  02/19/2018 10/20/2017 07/20/2017 03/20/2017 11/19/2016  Falls in the past year? 1 No Yes No Yes  Number falls in past yr: 1 - 1 - 1  Comment tripped in yard, no injury - - - -  Injury with Fall? 0 - Yes - Yes  Comment - - slipped on ketchup in a restaurant- fracture ribs - -  Follow up Falls prevention discussed - - - Education provided    FALL RISK PREVENTION PERTAINING TO THE HOME:  Any stairs in or around the home WITH handrails? Yes Home free of loose throw rugs in walkways, pet beds, electrical cords, etc? Yes  Adequate lighting in your home to reduce risk of falls? Yes   ASSISTIVE DEVICES UTILIZED TO PREVENT FALLS:  Life alert? No  Use of a cane, walker or w/c? No  Grab bars in the bathroom? Yes  Shower chair or bench in shower? No  Elevated toilet seat or a  handicapped toilet? No   DME ORDERS:  DME order needed?  No   TIMED UP AND GO:  Was the test performed? Yes Length of time to ambulate 10 feet: 6 sec.   GAIT:  Appearance of gait: Gait stead-fast and without the use of an assistive device.  Education: Fall risk prevention has been discussed.  Intervention(s) required? No   Depression Screen PHQ 2/9 Scores 02/19/2018 10/20/2017 10/20/2017 07/20/2017  PHQ - 2 Score 0 2 2 0  PHQ- 9 Score 2 3 3  0     Cognitive Function     6CIT Screen 02/19/2018  What Year? 0 points  What month? 0 points  What time? 0 points  Count back from 20 0 points  Months in reverse 0 points  Repeat phrase 2 points  Total Score 2    Immunization History  Administered Date(s) Administered  . Influenza, High Dose Seasonal PF 11/19/2016, 10/20/2017  . Influenza-Unspecified 01/05/2015  . Pneumococcal Conjugate-13 07/03/2014  . Pneumococcal Polysaccharide-23 11/03/2011, 11/19/2016  . Td 08/13/2009  . Zoster 02/19/2012    Qualifies for Shingles Vaccine? Yes  Zostavax completed 2014. Due for Shingrix. Education has been provided regarding the importance of this vaccine. Pt has been advised to call insurance company to determine out of pocket expense. Advised may also receive vaccine at local pharmacy or Health Dept. Verbalized acceptance and understanding.  Tdap: Up to date  Flu Vaccine: Up to date  Pneumococcal Vaccine: Up to date   Screening Tests Health Maintenance  Topic Date Due  . MAMMOGRAM  01/15/2018  . TETANUS/TDAP  08/14/2019  . COLONOSCOPY  10/27/2020  . INFLUENZA VACCINE  Completed  . DEXA SCAN  Completed  . Hepatitis C Screening  Completed  . PNA vac Low Risk Adult  Completed    Cancer Screenings:  Colorectal Screening: Completed 10/28/2010.  Repeat every 10 years  Mammogram: Completed 01/15/17. Repeat every year. Ordered today. Pt provided with contact information and advised to call to schedule appt.   Bone Density:  Completed 11/03/17. Results reflect NORMAL,  Repeat every 2 years.    Lung Cancer Screening: (Low Dose CT Chest recommended if Age 85-80 years, 30 pack-year currently smoking OR have quit w/in 15years.) does not qualify.    Additional Screening:  Hepatitis C Screening: does qualify; Completed 05/30/16  Vision Screening: Recommended annual ophthalmology exams for early detection of glaucoma and other disorders of the eye. Is the patient up to date with their annual eye exam?  Yes  Who is the provider or what is the name of the office in which the pt attends annual eye exams? [facility]  Dental Screening: Recommended annual dental exams for proper oral hygiene  Community Resource Referral:  CRR required this visit?  No      Plan:     I have personally reviewed and addressed the Medicare Annual Wellness questionnaire and have noted the following in the patient's chart:  A. Medical and social history B. Use of alcohol, tobacco or illicit drugs  C. Current medications and supplements D. Functional ability and status E.  Nutritional status F.  Physical activity G. Advance directives H. List of other physicians I.  Hospitalizations, surgeries, and ER visits in previous 12 months J.  Vitals K. Screenings such as hearing and vision if needed, cognitive and depression L. Referrals and appointments   In addition, I have reviewed and discussed with patient certain preventive protocols, quality metrics, and best practice recommendations. A written personalized care plan for preventive services as well as general preventive health recommendations were provided to patient.   Signed,  Reather LittlerKasey Pinkney Venard, LPN Nurse Health Advisor   Nurse Notes: Pt doing well and appreciative of visit, also seeing Dr. Carlynn PurlSowles today.

## 2018-02-19 NOTE — Progress Notes (Signed)
Name: Kari Lee   MRN: [id number]    DOB: May 18, 1945   Date:02/19/2018       Progress Note  Subjective  Chief Complaint  Chief Complaint  Patient presents with  . Medication Refill  . Hypertension  . Anxiety  . Gastroesophageal Reflux  . Hyperlipidemia  . Osteoarthritis  . Hot Flashes    HPI  HTN she is taking medication and denies side effects. No chest pain ( except for brief - random lasts only seconds)  or palpitation. BP was low and we went down from 50 mg to 25 of Losartan but bp went up again and she is back on 50 mg again. BP is at goal today and we will continue same dose for now  Bradycardia: she states her heart rate is always slow and seen by cardiologist and negative evaluation.   Anxiety: she is doing well, no recent problems, and has not been taking valium.   GERD/change in bowel movement: she is taking omeprazole very 3 days over the past month, she states since decreased dose and increased fiber and decreased intake of red meat, her bowel movements has changed, still once a day, but bristol can be 4-6    Obesity:she joined [employer] 05/26/2017, she started at 200.2 lbs and is down to 173 lbs She is happy with results. Continue hard work   Hot Flashes: shesaw Dr. Chauncey Cruel she was noticingdryness in her face and also hot flashes,she states half dose of estrogen is controlling her symptoms. Unchanged   Elevated liver enzymes: over 100's , seen by Dr. Lars Pinks, US showed fatty liver, changed diet and lost weight, liver enzymes normalized. She would like to have repeat labs today   OA: most symptoms now on her hands, but has a history of knee replacement and left hip, has DIP deformities and also inability to make a tight fist. She is trying to avoid naproxen, down to at most once a day and is still taking Tylenol .  Hyperlipidemia: she is on atorvastatin and is doing well at this time, she denies myalgia.   Major Depression: she has a long history  of depression in the past had a lot of lack of motivation and felt hopeless, but currently not having those problems. She states not having problems at this time   Patient Active Problem List   Diagnosis Date Noted  . Mild major depression (HCC) 11/19/2016  . Traumatic closed nondisplaced fracture of one rib of right side 08/22/2016  . Osteoarthritis of both hands 08/19/2016  . Fatty liver disease, nonalcoholic 05/30/2016  . Bradycardia 05/20/2016  . Current long-term use of postmenopausal hormone replacement therapy 05/20/2016  . GERD (gastroesophageal reflux disease) 05/20/2016  . Hyperlipidemia, unspecified 05/20/2016  . Hypertension 05/20/2016  . Insomnia 05/20/2016  . Arthritis 05/20/2016  . Anxiety 05/20/2016  . Rosacea 05/20/2016  . History of hip replacement, total, 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    Past Surgical History:  Procedure Laterality Date  . CERVICAL CONE BIOPSY    . COLONOSCOPY  10/2010  . COLONOSCOPY  2004  . endoscopic carpal tunn Bilateral   . HALLUX VALGUS CORRECTION Right 2005  . HYSTERECTOMY ABDOMINAL WITH SALPINGECTOMY  1981  . INCISION TENDON SHEATH HAND Right   . JOINT REPLACEMENT Right 11/21/2010   knee  . JOINT REPLACEMENT Left 11/04/2011   knee  . JOINT REPLACEMENT Left 04/27/2012   hip   . KNEE ARTHROSCOPY Right   . KNEE ARTHROSCOPY Left 12/18/2011    Family History  Problem Relation Age of Onset  . Alzheimer's disease Mother 25  . Osteoporosis Mother   . Stroke Father 101  . Arthritis Sister   . Basal cell carcinoma Sister   . Macular degeneration Other     Social History   Socioeconomic History  . Marital status: Married    Spouse name: Maisie Fus  . Number of children: 2  . Years of education: Not on file  . Highest education level: Associate degree: occupational, Scientist, product/process development, or vocational program  Occupational History  . Occupation: Retired    Comment: Engineer, civil (consulting)  Social Needs  . Financial resource strain: Not hard at all  . Food insecurity:     Worry: Never true    Inability: Never true  . Transportation needs:    Medical: No    Non-medical: No  Tobacco Use  . Smoking status: Never Smoker  . Smokeless tobacco: Never Used  Substance and Sexual Activity  . Alcohol use: Yes    Alcohol/week: 1.0 standard drinks    Types: 1 Glasses of wine per week  . Drug use: No  . Sexual activity: Yes    Partners: Male    Birth control/protection: None, Surgical, Post-menopausal  Lifestyle  . Physical activity:    Days per week: 3 days    Minutes per session: 30 min  . Stress: Not at all  Relationships  . Social connections:    Talks on phone: More than three times a week    Gets together: Twice a week    Attends religious service: More than 4 times per year    Active member of club or organization: Yes    Attends meetings of clubs or organizations: More than 4 times per year    Relationship status: Married  . Intimate partner violence:    Fear of current or ex partner: No    Emotionally abused: No    Physically abused: No    Forced sexual activity: No  Other Topics Concern  . Not on file  Social History Narrative   She moved to [place] to downsize, but decided to move back to [place] after 2 years.    Married.   One daughter lives in [place].    The other daughter in [place].     Current Outpatient Medications:  .  acetaminophen (TYLENOL) 500 MG tablet, Take 1 tablet (500 mg total) by mouth 2 (two) times daily., Disp: 60 tablet, Rfl: 0 .  atorvastatin (LIPITOR) 40 MG tablet, Take 1 tablet (40 mg total) by mouth daily at 6 PM., Disp: 90 tablet, Rfl: 1 .  diazepam (VALIUM) 5 MG tablet, Take 1 tablet (5 mg total) by mouth daily as needed for anxiety. (Patient not taking: Reported on 02/19/2018), Disp: 30 tablet, Rfl: 0 .  estradiol (ESTRACE) 0.5 MG tablet, Take 1 tablet (0.5 mg total) by mouth daily., Disp: 90 tablet, Rfl: 3 .  losartan (COZAAR) 50 MG tablet, Take 1 tablet (50 mg total) by mouth daily., Disp: 90 tablet,  Rfl: 1 .  Melatonin 10 MG TABS, Take 1 tablet by mouth at bedtime as needed., Disp: , Rfl:  .  Multiple Vitamin (MULTI-VITAMINS) TABS, Take by mouth., Disp: , Rfl:  .  naproxen (NAPROSYN) 250 MG tablet, Take 250 mg by mouth as needed., Disp: , Rfl:  .  omeprazole (PRILOSEC) 20 MG capsule, Take 1 capsule (20 mg total) by mouth every other day., Disp: 90 capsule, Rfl: 1  Allergies  Allergen Reactions  . Sulfa Antibiotics Nausea Only  .  Penicillins Rash    I personally reviewed active problem list, medication list, allergies, family history, social history with the patient/caregiver today.   ROS  Constitutional: Negative for fever, positive for  weight change.  Respiratory: Negative for cough and shortness of breath.   Cardiovascular: Negative for chest pain or palpitations.  Gastrointestinal: Negative for abdominal pain, positive for  bowel changes.  Musculoskeletal: Negative for gait problem or joint swelling.  Skin: Negative for rash.  Neurological: Negative for dizziness or headache.  No other specific complaints in a complete review of systems (except as listed in HPI above).  Objective  Vitals:   02/19/18 1002  BP: 126/82  Pulse: (!) 56  Resp: 16  Temp: (!) 97.5 F (36.4 C)  TempSrc: Oral  SpO2: 99%  Weight: 173 lb 8 oz (78.7 kg)  Height: 5\' 4"  (1.626 m)    Body mass index is 29.78 kg/m.  Physical Exam  Constitutional: Patient appears well-developed and well-nourished. Overweight. No distress.  HEENT: head atraumatic, normocephalic, pupils equal and reactive to light,  neck supple, throat within normal limits Cardiovascular: Normal rate, regular rhythm and normal heart sounds.  No murmur heard. No BLE edema. Pulmonary/Chest: Effort normal and breath sounds normal. No respiratory distress. Abdominal: Soft.  There is no tenderness. Psychiatric: Patient has a normal mood and affect. behavior is normal. Judgment and thought content normal.   PHQ2/9: Depression  screen [facility] 2/9 02/19/2018 10/20/2017 10/20/2017 07/20/2017 11/19/2016  Decreased Interest 0 1 1 0 1  Down, Depressed, Hopeless 0 1 1 0 1  PHQ - 2 Score 0 2 2 0 2  Altered sleeping 1 0 0 0 1  Tired, decreased energy 0 1 1 0 1  Change in appetite 0 0 0 0 1  Feeling bad or failure about yourself  0 0 0 0 1  Trouble concentrating 1 0 0 0 1  Moving slowly or fidgety/restless 0 0 0 0 0  Suicidal thoughts 0 0 0 0 0  PHQ-9 Score 2 3 3  0 7  Difficult doing work/chores Not difficult at all Not difficult at all Not difficult at all Not difficult at all Somewhat difficult     Fall Risk: Fall Risk  02/19/2018 10/20/2017 07/20/2017 03/20/2017 11/19/2016  Falls in the past year? 1 No Yes No Yes  Number falls in past yr: 1 - 1 - 1  Comment tripped in yard, no injury - - - -  Injury with Fall? 0 - Yes - Yes  Comment - - slipped on ketchup in a restaurant- fracture ribs - -  Follow up Falls prevention discussed - - - Education provided     Assessment & Plan  1. Essential hypertension  - COMPLETE METABOLIC PANEL WITH GFR - CBC with Differential/Platelet  2. Major depression in remission [facility](HCC)  Doing well   3. Primary osteoarthritis of both hands  stable  4. Mixed hyperlipidemia  Since change in life style lipid panel is normal , on statin therapy   5. Fatty liver  Seen by Dr. Lars PinksWhol in the past and last liver enzymes within normal   6. GERD without esophagitis  Weaning self off PPI, and states noticed change in bowel movements since, advised still needs to see GI and consider repeat colonoscopy   7. Change in bowel movement  - TSH - Ambulatory referral to Gastroenterology  8. Bradycardia  Long history, she states seen by Dr. Juliann Paresallwood in the past and negative evaluation, she was given reassurance , no dizziness.

## 2018-03-10 ENCOUNTER — Ambulatory Visit
Admission: RE | Admit: 2018-03-10 | Discharge: 2018-03-10 | Disposition: A | Discharge status: Home / Self Care | Payer: Medicare HMO | Source: Ambulatory Visit | Attending: Family Medicine | Admitting: Family Medicine

## 2018-03-10 DIAGNOSIS — Z1231 Encounter for screening mammogram for malignant neoplasm of breast: Secondary | ICD-10-CM

## 2018-03-17 ENCOUNTER — Ambulatory Visit: Payer: Medicare HMO | Admitting: Gastroenterology

## 2018-03-30 ENCOUNTER — Other Ambulatory Visit: Payer: Self-pay | Admitting: Family Medicine

## 2018-03-30 DIAGNOSIS — K219 Gastro-esophageal reflux disease without esophagitis: Secondary | ICD-10-CM

## 2018-04-06 DIAGNOSIS — R69 Illness, unspecified: Secondary | ICD-10-CM | POA: Diagnosis not present

## 2018-05-20 ENCOUNTER — Other Ambulatory Visit: Payer: Self-pay | Admitting: Obstetrics and Gynecology

## 2018-06-22 ENCOUNTER — Ambulatory Visit: Payer: Medicare HMO | Admitting: Family Medicine

## 2018-06-28 ENCOUNTER — Encounter: Payer: Self-pay | Admitting: Family Medicine

## 2018-06-28 ENCOUNTER — Ambulatory Visit (INDEPENDENT_AMBULATORY_CARE_PROVIDER_SITE_OTHER): Payer: Medicare HMO | Admitting: Family Medicine

## 2018-06-28 ENCOUNTER — Other Ambulatory Visit: Payer: Self-pay

## 2018-06-28 VITALS — BP 116/70 | HR 63 | Temp 97.9°F | Resp 16 | Ht 64.0 in | Wt 180.4 lb

## 2018-06-28 DIAGNOSIS — F419 Anxiety disorder, unspecified: Secondary | ICD-10-CM

## 2018-06-28 DIAGNOSIS — M19041 Primary osteoarthritis, right hand: Secondary | ICD-10-CM | POA: Diagnosis not present

## 2018-06-28 DIAGNOSIS — K219 Gastro-esophageal reflux disease without esophagitis: Secondary | ICD-10-CM

## 2018-06-28 DIAGNOSIS — K760 Fatty (change of) liver, not elsewhere classified: Secondary | ICD-10-CM

## 2018-06-28 DIAGNOSIS — F325 Major depressive disorder, single episode, in full remission: Secondary | ICD-10-CM

## 2018-06-28 DIAGNOSIS — M19042 Primary osteoarthritis, left hand: Secondary | ICD-10-CM | POA: Diagnosis not present

## 2018-06-28 DIAGNOSIS — E782 Mixed hyperlipidemia: Secondary | ICD-10-CM | POA: Diagnosis not present

## 2018-06-28 DIAGNOSIS — I10 Essential (primary) hypertension: Secondary | ICD-10-CM

## 2018-06-28 DIAGNOSIS — R69 Illness, unspecified: Secondary | ICD-10-CM | POA: Diagnosis not present

## 2018-06-28 MED ORDER — DIAZEPAM 5 MG PO TABS: 5.0000 mg | ORAL_TABLET | Freq: Every day | ORAL | 0 refills | Status: DC | PRN

## 2018-06-28 MED ORDER — ATORVASTATIN CALCIUM 40 MG PO TABS: 40.0000 mg | ORAL_TABLET | Freq: Every day | ORAL | 1 refills | Status: DC

## 2018-06-28 MED ORDER — LOSARTAN POTASSIUM 50 MG PO TABS: 50.0000 mg | ORAL_TABLET | Freq: Every day | ORAL | 1 refills | Status: DC

## 2018-06-28 NOTE — Progress Notes (Signed)
Name: Kari ParkinsRebecca K Reiger   MRN: [id number]    DOB: 18-Oct-1945   Date:06/28/2018       Progress Note  Subjective  Chief Complaint  Chief Complaint  Patient presents with  . Hypertension  . Hyperlipidemia  . Anxiety  . Depression    HPI  HTN she is taking medication and denies side effects. No chest pain   or palpitation. BPwas low and we went down from 50 mg to 25 of Losartan but bp went up again and she is back on 50 mg again. BP is towards low end of normal today, but no dizziness we will continue losartan 50 mg   Bradycardia: she states her heart rate is always slow and seen by cardiologist and negative evaluation. Unchanged    Anxiety: she is doing well, no recent problems, she has not taken Valium in a while but would like to have a few pills at home    GERD/: she is off PPI and bowel movements are back to normal, she decided not to go to GI at this time   Obesity:she joined [employer] 05/26/2017, she started at 200.2 lbs and she down to 173 lbs but since COVID-19 is up to 180 lbs.  She states weight watchers no longer being in person, only online so she stopped the meetings. She states she resumed walking over the past couple of weeks.   Hot Flashes: shesaw Dr. Chauncey CruelStabler she was noticingdryness in her face and also hot flashes,she states half dose of estrogen is controlling her symptoms. Unchanged   Elevated liver enzymes: over 100's , seen by Dr. Lars PinksWhol, US showed fatty liver, changed diet and lost weight, liver enzymes normalized.Last labs was normal 01/2018  OA: most symptoms now on her hands, but has a history of knee replacement and left hip, has DIP deformities and also inability to make a tight fist.She is trying to avoid naproxen, down to at most once a day, usually at night   Hyperlipidemia: she is on atorvastatin and is doing well at this time, she denies myalgia. Last level checked in June of last year, and we will recheck it today   Major Depression:  she has a long history of depression in the past had a lot of lack of motivation and felt hopeless, but currently not having those problems. She is helping a friend that has cancer right now.   Patient Active Problem List   Diagnosis Date Noted  . Mild major depression (HCC) 11/19/2016  . Traumatic closed nondisplaced fracture of one rib of right side 08/22/2016  . Osteoarthritis of both hands 08/19/2016  . Fatty liver disease, nonalcoholic 05/30/2016  . Bradycardia 05/20/2016  . Current long-term use of postmenopausal hormone replacement therapy 05/20/2016  . GERD (gastroesophageal reflux disease) 05/20/2016  . Hyperlipidemia, unspecified 05/20/2016  . Hypertension 05/20/2016  . Insomnia 05/20/2016  . Arthritis 05/20/2016  . Anxiety 05/20/2016  . Rosacea 05/20/2016  . History of hip replacement, total, 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    Past Surgical History:  Procedure Laterality Date  . CERVICAL CONE BIOPSY    . COLONOSCOPY  10/2010  . COLONOSCOPY  2004  . endoscopic carpal tunn Bilateral   . HALLUX VALGUS CORRECTION Right 2005  . HYSTERECTOMY ABDOMINAL WITH SALPINGECTOMY  1981  . INCISION TENDON SHEATH HAND Right   . JOINT REPLACEMENT Right 11/21/2010   knee  . JOINT REPLACEMENT Left 11/04/2011   knee  . JOINT REPLACEMENT Left 04/27/2012   hip   . KNEE ARTHROSCOPY  Right   . KNEE ARTHROSCOPY Left 12/18/2011    Family History  Problem Relation Age of Onset  . Alzheimer's disease Mother 56  . Osteoporosis Mother   . Stroke Father 19  . Arthritis Sister   . Basal cell carcinoma Sister   . Macular degeneration Other   . Breast cancer Maternal Aunt 70    Social History   Socioeconomic History  . Marital status: Married    Spouse name: Maisie Fus  . Number of children: 2  . Years of education: Not on file  . Highest education level: Associate degree: occupational, Scientist, product/process development, or vocational program  Occupational History  . Occupation: Retired    Comment: Engineer, civil (consulting)  Social Needs  .  Financial resource strain: Not hard at all  . Food insecurity:    Worry: Never true    Inability: Never true  . Transportation needs:    Medical: No    Non-medical: No  Tobacco Use  . Smoking status: Never Smoker  . Smokeless tobacco: Never Used  Substance and Sexual Activity  . Alcohol use: Yes    Alcohol/week: 1.0 standard drinks    Types: 1 Glasses of wine per week  . Drug use: No  . Sexual activity: Yes    Partners: Male    Birth control/protection: None, Surgical, Post-menopausal  Lifestyle  . Physical activity:    Days per week: 3 days    Minutes per session: 30 min  . Stress: Not at all  Relationships  . Social connections:    Talks on phone: More than three times a week    Gets together: Twice a week    Attends religious service: More than 4 times per year    Active member of club or organization: Yes    Attends meetings of clubs or organizations: More than 4 times per year    Relationship status: Married  . Intimate partner violence:    Fear of current or ex partner: No    Emotionally abused: No    Physically abused: No    Forced sexual activity: No  Other Topics Concern  . Not on file  Social History Narrative   She moved to [place] to downsize, but decided to move back to [place] after 2 years.    Married.   One daughter lives in [place].    The other daughter in [place].     Current Outpatient Medications:  .  acetaminophen (TYLENOL) 500 MG tablet, Take 1 tablet (500 mg total) by mouth 2 (two) times daily., Disp: 60 tablet, Rfl: 0 .  Ascorbic Acid (VITAMIN C) 1000 MG tablet, Take 1,000 mg by mouth daily., Disp: , Rfl:  .  atorvastatin (LIPITOR) 40 MG tablet, Take 1 tablet (40 mg total) by mouth daily at 6 PM., Disp: 90 tablet, Rfl: 1 .  diazepam (VALIUM) 5 MG tablet, Take 1 tablet (5 mg total) by mouth daily as needed for anxiety., Disp: 30 tablet, Rfl: 0 .  estradiol (ESTRACE) 0.5 MG tablet, TAKE 1 TABLET BY MOUTH EVERY DAY, Disp: 90 tablet, Rfl:  0 .  losartan (COZAAR) 50 MG tablet, Take 1 tablet (50 mg total) by mouth daily., Disp: 90 tablet, Rfl: 1 .  Melatonin 10 MG TABS, Take 1 tablet by mouth at bedtime as needed., Disp: , Rfl:  .  Multiple Vitamin (MULTI-VITAMINS) TABS, Take by mouth., Disp: , Rfl:  .  naproxen (NAPROSYN) 250 MG tablet, Take 250 mg by mouth as needed., Disp: , Rfl:  .  Vitamin D, Cholecalciferol, 50 MCG (2000 UT) CAPS, Take 2,000 Units by mouth daily., Disp: , Rfl:   Allergies  Allergen Reactions  . Sulfa Antibiotics Nausea Only  . Penicillins Rash    I personally reviewed active problem list, medication list, allergies, family history, social history with the patient/caregiver today.   ROS  Constitutional: Negative for fever or weight change.  Respiratory: Negative for cough and shortness of breath.   Cardiovascular: Negative for chest pain or palpitations.  Gastrointestinal: Negative for abdominal pain, no bowel changes.  Musculoskeletal: Negative for gait problem or joint swelling.  Skin: Negative for rash.  Neurological: Negative for dizziness or headache.  No other specific complaints in a complete review of systems (except as listed in HPI above).  Objective  Vitals:   06/28/18 1509  BP: 116/70  Pulse: 63  Resp: 16  Temp: 97.9 F (36.6 C)  TempSrc: Oral  SpO2: 98%  Weight: 180 lb 6.4 oz (81.8 kg)  Height:  (1.626 m)    Body mass index is 30.97 kg/m.  Physical Exam  Constitutional: Patient appears well-developed and well-nourished. Obese  No distress.  HEENT: head atraumatic, normocephalic, pupils equal and reactive to light, neck supple, wearing a mask  Cardiovascular: Normal rate, regular rhythm and normal heart sounds.  No murmur heard. No BLE edema. Pulmonary/Chest: Effort normal and breath sounds normal. No respiratory distress. Abdominal: Soft.  There is no tenderness. Psychiatric: Patient has a normal mood and affect. behavior is normal. Judgment and thought content  normal.  PHQ2/9: Depression screen [facility] 2/9 06/28/2018 02/19/2018 10/20/2017 10/20/2017 07/20/2017  Decreased Interest 0 0 1 1 0  Down, Depressed, Hopeless 0 0 1 1 0  PHQ - 2 Score 0 0 2 2 0  Altered sleeping 0 1 0 0 0  Tired, decreased energy 0 0 1 1 0  Change in appetite 0 0 0 0 0  Feeling bad or failure about yourself  0 0 0 0 0  Trouble concentrating 0 1 0 0 0  Moving slowly or fidgety/restless 0 0 0 0 0  Suicidal thoughts 0 0 0 0 0  PHQ-9 Score 0 0  Difficult doing work/chores Not difficult at all Not difficult at all Not difficult at all Not difficult at all Not difficult at all    phq 9 is negative   Fall Risk: Fall Risk  06/28/2018 02/19/2018 10/20/2017 07/20/2017 03/20/2017  Falls in the past year? 0 1 No Yes No  Number falls in past yr: 0 1 - 1 -  Comment - tripped in yard, no injury - - -  Injury with Fall? 0 0 - Yes -  Comment - - - slipped on ketchup in a restaurant- fracture ribs -  Follow up - Falls prevention discussed - - -    Functional Status Survey: Is the patient deaf or have difficulty hearing?: No Does the patient have difficulty seeing, even when wearing glasses/contacts?: No Does the patient have difficulty concentrating, remembering, or making decisions?: No Does the patient have difficulty walking or climbing stairs?: No Does the patient have difficulty dressing or bathing?: No Does the patient have difficulty doing errands alone such as visiting a doctor's office or shopping?: No    Assessment & Plan  1. Mixed hyperlipidemia  - atorvastatin (LIPITOR) 40 MG tablet; Take 1 tablet (40 mg total) by mouth daily at 6 PM.  Dispense: 90 tablet; Refill: 1 - Lipid panel  2. Essential hypertension  - losartan (  COZAAR) 50 MG tablet; Take 1 tablet (50 mg total) by mouth daily.  Dispense: 90 tablet; Refill: 1 - COMPLETE METABOLIC PANEL WITH GFR  3. Anxiety disorder, unspecified type  - diazepam (VALIUM) 5 MG tablet; Take 1 tablet (5 mg total) by mouth  daily as needed for anxiety.  Dispense: 5 tablet; Refill: 0  4. Primary osteoarthritis    5. Major depression in remission [facility])  Doing well   6. GERD without esophagitis  Off medication and doing well   7. Fatty liver  Recheck labs

## 2018-06-28 NOTE — Patient Instructions (Signed)
Shoulder Exercises Ask your health care provider which exercises are safe for you. Do exercises exactly as told by your health care provider and adjust them as directed. It is normal to feel mild stretching, pulling, tightness, or discomfort as you do these exercises, but you should stop right away if you feel sudden pain or your pain gets worse.Do not begin these exercises until told by your health care provider. Range of Motion Exercises        These exercises warm up your muscles and joints and improve the movement and flexibility of your shoulder. These exercises also help to relieve pain, numbness, and tingling. These exercises involve stretching your injured shoulder directly. Exercise A: Pendulum 1. Stand near a wall or a surface that you can hold onto for balance. 2. Bend at the waist and let your left / right arm hang straight down. Use your other arm to support you. Keep your back straight and do not lock your knees. 3. Relax your left / right arm and shoulder muscles, and move your hips and your trunk so your left / right arm swings freely. Your arm should swing because of the motion of your body, not because you are using your arm or shoulder muscles. 4. Keep moving your body so your arm swings in the following directions, as told by your health care provider: ? Side to side. ? Forward and backward. ? In clockwise and counterclockwise circles. 5. Continue each motion for __________ seconds, or for as long as told by your health care provider. 6. Slowly return to the starting position. Repeat __________ times. Complete this exercise __________ times a day. Exercise B:Flexion, Standing 1. Stand and hold a broomstick, a cane, or a similar object. Place your hands a little more than shoulder-width apart on the object. Your left / right hand should be palm-up, and your other hand should be palm-down. 2. Keep your elbow straight and keep your shoulder muscles relaxed. Push the stick  down with your healthy arm to raise your left / right arm in front of your body, and then over your head until you feel a stretch in your shoulder. ? Avoid shrugging your shoulder while you raise your arm. Keep your shoulder blade tucked down toward the middle of your back. 3. Hold for __________ seconds. 4. Slowly return to the starting position. Repeat __________ times. Complete this exercise __________ times a day. Exercise C: Abduction, Standing 1. Stand and hold a broomstick, a cane, or a similar object. Place your hands a little more than shoulder-width apart on the object. Your left / right hand should be palm-up, and your other hand should be palm-down. 2. While keeping your elbow straight and your shoulder muscles relaxed, push the stick across your body toward your left / right side. Raise your left / right arm to the side of your body and then over your head until you feel a stretch in your shoulder. ? Do not raise your arm above shoulder height, unless your health care provider tells you to do that. ? Avoid shrugging your shoulder while you raise your arm. Keep your shoulder blade tucked down toward the middle of your back. 3. Hold for __________ seconds. 4. Slowly return to the starting position. Repeat __________ times. Complete this exercise __________ times a day. Exercise D:Internal Rotation 1. Place your left / right hand behind your back, palm-up. 2. Use your other hand to dangle an exercise band, a towel, or a similar object over your shoulder.  Grasp the band with your left / right hand so you are holding onto both ends. 3. Gently pull up on the band until you feel a stretch in the front of your left / right shoulder. ? Avoid shrugging your shoulder while you raise your arm. Keep your shoulder blade tucked down toward the middle of your back. 4. Hold for __________ seconds. 5. Release the stretch by letting go of the band and lowering your hands. Repeat __________ times.  Complete this exercise __________ times a day. Stretching Exercises  These exercises warm up your muscles and joints and improve the movement and flexibility of your shoulder. These exercises also help to relieve pain, numbness, and tingling. These exercises are done using your healthy shoulder to help stretch the muscles of your injured shoulder. Exercise E: [employer], [employer] party (External Rotation and Abduction) 1. Stand in a doorway with one of your feet slightly in front of the other. This is called a staggered stance. If you cannot reach your forearms to the door frame, stand facing a corner of a room. 2. Choose one of the following positions as told by your health care provider: ? Place your hands and forearms on the door frame above your head. ? Place your hands and forearms on the door frame at the height of your head. ? Place your hands on the door frame at the height of your elbows. 3. Slowly move your weight onto your front foot until you feel a stretch across your chest and in the front of your shoulders. Keep your head and chest upright and keep your abdominal muscles tight. 4. Hold for __________ seconds. 5. To release the stretch, shift your weight to your back foot. Repeat __________ times. Complete this stretch __________ times a day. Exercise F:Extension, Standing 1. Stand and hold a broomstick, a cane, or a similar object behind your back. ? Your hands should be a little wider than shoulder-width apart. ? Your palms should face away from your back. 2. Keeping your elbows straight and keeping your shoulder muscles relaxed, move the stick away from your body until you feel a stretch in your shoulder. ? Avoid shrugging your shoulders while you move the stick. Keep your shoulder blade tucked down toward the middle of your back. 3. Hold for __________ seconds. 4. Slowly return to the starting position. Repeat __________ times. Complete this exercise __________ times a  day. Strengthening Exercises           These exercises build strength and endurance in your shoulder. Endurance is the ability to use your muscles for a long time, even after they get tired. Exercise G:External Rotation 1. Sit in a stable chair without armrests. 2. Secure an exercise band at elbow height on your left / right side. 3. Place a soft object, such as a folded towel or a small pillow, between your left / right upper arm and your body to move your elbow a few inches away (about 10 cm) from your side. 4. Hold the end of the band so it is tight and there is no slack. 5. Keeping your elbow pressed against the soft object, move your left / right forearm out, away from your abdomen. Keep your body steady so only your forearm moves. 6. Hold for __________ seconds. 7. Slowly return to the starting position. Repeat __________ times. Complete this exercise __________ times a day. Exercise H:Shoulder Abduction 1. Sit in a stable chair without armrests, or stand. 2. Hold a __________ weight in your  left / right hand, or hold an exercise band with both hands. 3. Start with your arms straight down and your left / right palm facing in, toward your body. 4. Slowly lift your left / right hand out to your side. Do not lift your hand above shoulder height unless your health care provider tells you that this is safe. ? Keep your arms straight. ? Avoid shrugging your shoulder while you do this movement. Keep your shoulder blade tucked down toward the middle of your back. 5. Hold for __________ seconds. 6. Slowly lower your arm, and return to the starting position. Repeat __________ times. Complete this exercise __________ times a day. Exercise I:Shoulder Extension 1. Sit in a stable chair without armrests, or stand. 2. Secure an exercise band to a stable object in front of you where it is at shoulder height. 3. Hold one end of the exercise band in each hand. Your palms should face each  other. 4. Straighten your elbows and lift your hands up to shoulder height. 5. Step back, away from the secured end of the exercise band, until the band is tight and there is no slack. 6. Squeeze your shoulder blades together as you pull your hands down to the sides of your thighs. Stop when your hands are straight down by your sides. Do not let your hands go behind your body. 7. Hold for __________ seconds. 8. Slowly return to the starting position. Repeat __________ times. Complete this exercise __________ times a day. Exercise J:Standing Shoulder Row 1. Sit in a stable chair without armrests, or stand. 2. Secure an exercise band to a stable object in front of you so it is at waist height. 3. Hold one end of the exercise band in each hand. Your palms should be in a thumbs-up position. 4. Bend each of your elbows to an "L" shape (about 90 degrees) and keep your upper arms at your sides. 5. Step back until the band is tight and there is no slack. 6. Slowly pull your elbows back behind you. 7. Hold for __________ seconds. 8. Slowly return to the starting position. Repeat __________ times. Complete this exercise __________ times a day. Exercise K:Shoulder Press-Ups 1. Sit in a stable chair that has armrests. Sit upright, with your feet flat on the floor. 2. Put your hands on the armrests so your elbows are bent and your fingers are pointing forward. Your hands should be about even with the sides of your body. 3. Push down on the armrests and use your arms to lift yourself off of the chair. Straighten your elbows and lift yourself up as much as you comfortably can. ? Move your shoulder blades down, and avoid letting your shoulders move up toward your ears. ? Keep your feet on the ground. As you get stronger, your feet should support less of your body weight as you lift yourself up. 4. Hold for __________ seconds. 5. Slowly lower yourself back into the chair. Repeat __________ times. Complete  this exercise __________ times a day. Exercise L: Wall Push-Ups 1. Stand so you are facing a stable wall. Your feet should be about one arm-length away from the wall. 2. Lean forward and place your palms on the wall at shoulder height. 3. Keep your feet flat on the floor as you bend your elbows and lean forward toward the wall. 4. Hold for __________ seconds. 5. Straighten your elbows to push yourself back to the starting position. Repeat __________ times. Complete this exercise __________ times  a day. This information is not intended to replace advice given to you by your health care provider. Make sure you discuss any questions you have with your health care provider. Document Released: 11/27/2004 Document Revised: 05/19/2017 Document Reviewed: 09/24/2014 [employer]vier Interactive Patient Education  2019 [employer] Shoulder Impingement Syndrome Rehab Ask your health care provider which exercises are safe for you. Do exercises exactly as told by your health care provider and adjust them as directed. It is normal to feel mild stretching, pulling, tightness, or discomfort as you do these exercises, but you should stop right away if you feel sudden pain or your pain gets worse.Do not begin these exercises until told by your health care provider. Stretching and range of motion exercise This exercise warms up your muscles and joints and improves the movement and flexibility of your shoulder. This exercise also helps to relieve pain and stiffness. Exercise A: Passive horizontal adduction  1. Sit or stand and pull your left / right elbow across your chest, toward your other shoulder. Stop when you feel a gentle stretch in the back of your shoulder and upper arm. ? Keep your arm at shoulder height. ? Keep your arm as close to your body as you comfortably can. 2. Hold for __________ seconds. 3. Slowly return to the starting position. Repeat __________ times. Complete this exercise __________ times a  day. Strengthening exercises These exercises build strength and endurance in your shoulder. Endurance is the ability to use your muscles for a long time, even after they get tired. Exercise B: External rotation, isometric 1. Stand or sit in a doorway, facing the door frame. 2. Bend your left / right elbow and place the back of your wrist against the door frame. Only your wrist should be touching the frame. Keep your upper arm at your side. 3. Gently press your wrist against the door frame, as if you are trying to push your arm away from your abdomen. ? Avoid shrugging your shoulder while you press your hand against the door frame. Keep your shoulder blade tucked down toward the middle of your back. 4. Hold for __________ seconds. 5. Slowly release the tension, and relax your muscles completely before you do the exercise again. Repeat __________ times. Complete this exercise __________ times a day. Exercise C: Internal rotation, isometric  1. Stand or sit in a doorway, facing the door frame. 2. Bend your left / right elbow and place the inside of your wrist against the door frame. Only your wrist should be touching the frame. Keep your upper arm at your side. 3. Gently press your wrist against the door frame, as if you are trying to push your arm toward your abdomen. ? Avoid shrugging your shoulder while you press your hand against the door frame. Keep your shoulder blade tucked down toward the middle of your back. 4. Hold for __________ seconds. 5. Slowly release the tension, and relax your muscles completely before you do the exercise again. Repeat __________ times. Complete this exercise __________ times a day. Exercise D: Scapular protraction, supine  1. Lie on your back on a firm surface. Hold a __________ weight in your left / right hand. 2. Raise your left / right arm straight into the air so your hand is directly above your shoulder joint. 3. Push the weight into the air so your  shoulder lifts off of the surface that you are lying on. Do not move your head, neck, or back. 4. Hold for __________ seconds. 5.  Slowly return to the starting position. Let your muscles relax completely before you repeat this exercise. Repeat __________ times. Complete this exercise __________ times a day. Exercise E: Scapular retraction  1. Sit in a stable chair without armrests, or stand. 2. Secure an exercise band to a stable object in front of you so the band is at shoulder height. 3. Hold one end of the exercise band in each hand. Your palms should face down. 4. Squeeze your shoulder blades together and move your elbows slightly behind you. Do not shrug your shoulders while you do this. 5. Hold for __________ seconds. 6. Slowly return to the starting position. Repeat __________ times. Complete this exercise __________ times a day. Exercise F: Shoulder extension  1. Sit in a stable chair without armrests, or stand. 2. Secure an exercise band to a stable object in front of you where the band is above shoulder height. 3. Hold one end of the exercise band in each hand. 4. Straighten your elbows and lift your hands up to shoulder height. 5. Squeeze your shoulder blades together and pull your hands down to the sides of your thighs. Stop when your hands are straight down by your sides. Do not let your hands go behind your body. 6. Hold for __________ seconds. 7. Slowly return to the starting position. Repeat __________ times. Complete this exercise __________ times a day. This information is not intended to replace advice given to you by your health care provider. Make sure you discuss any questions you have with your health care provider. Document Released: 01/13/2005 Document Revised: 09/20/2015 Document Reviewed: 12/16/2014 Elsevier Interactive Patient Education  2019 [employer].

## 2018-06-29 LAB — COMPLETE METABOLIC PANEL WITH GFR
AG Ratio: 1.6 (calc) (ref 1.0–2.5)
ALT: 18 U/L (ref 6–29)
AST: 23 U/L (ref 10–35)
Albumin: 4.1 g/dL (ref 3.6–5.1)
Alkaline phosphatase (APISO): 64 U/L (ref 37–153)
BUN: 19 mg/dL (ref 7–25)
CO2: 25 mmol/L (ref 20–32)
Calcium: 9 mg/dL (ref 8.6–10.4)
Chloride: 107 mmol/L (ref 98–110)
Creat: 0.63 mg/dL (ref 0.60–0.93)
GFR, Est African American: 105 mL/min/{1.73_m2} (ref >=60)
GFR, Est Non African American: 91 mL/min/{1.73_m2} (ref >=60)
Globulin: 2.6 g/dL (calc) (ref 1.9–3.7)
Glucose, Bld: 85 mg/dL (ref 65–99)
Potassium: 4 mmol/L (ref 3.5–5.3)
Sodium: 139 mmol/L (ref 135–146)
Total Bilirubin: 0.6 mg/dL (ref 0.2–1.2)
Total Protein: 6.7 g/dL (ref 6.1–8.1)

## 2018-06-29 LAB — LIPID PANEL
Cholesterol: 163 mg/dL (ref <200)
HDL: 62 mg/dL (ref >=50)
LDL Cholesterol (Calc): 68 mg/dL (calc)
Non-HDL Cholesterol (Calc): 101 mg/dL (calc) (ref <130)
Total CHOL/HDL Ratio: 2.6 (calc) (ref <5.0)
Triglycerides: 237 mg/dL — ABNORMAL HIGH (ref <150)

## 2018-08-06 ENCOUNTER — Other Ambulatory Visit: Payer: Self-pay | Admitting: Obstetrics and Gynecology

## 2018-09-01 ENCOUNTER — Other Ambulatory Visit: Payer: Self-pay | Admitting: Family Medicine

## 2018-09-01 ENCOUNTER — Telehealth: Payer: Self-pay | Admitting: Family Medicine

## 2018-09-01 MED ORDER — VALSARTAN 80 MG PO TABS: 80.0000 mg | ORAL_TABLET | Freq: Every day | ORAL | 0 refills | Status: DC

## 2018-09-01 NOTE — Telephone Encounter (Signed)
Pt states she has been four days without losartan (COZAAR) 50 MG tablet.  Pt states pharmacy is telling her it is on back order and she doesn't know when she will be able to get it again. Pt needs to know what to do and if she is supposed to be changed to something else in the meantime.

## 2018-09-01 NOTE — Telephone Encounter (Signed)
Please advise. Losartan on backorder.

## 2018-10-13 ENCOUNTER — Other Ambulatory Visit: Payer: Self-pay | Admitting: Obstetrics and Gynecology

## 2018-10-13 ENCOUNTER — Other Ambulatory Visit: Payer: Self-pay

## 2018-10-13 ENCOUNTER — Telehealth: Payer: Self-pay | Admitting: Family Medicine

## 2018-10-13 NOTE — Telephone Encounter (Signed)
I think at this point I'd advise coming off the estrace

## 2018-10-13 NOTE — Telephone Encounter (Signed)
advise

## 2018-10-13 NOTE — Telephone Encounter (Signed)
Patient checking on the status of estradiol (ESTRACE) 0.5 MG tablet , patient confirmed PCP has prescribed in the past, informed please allow 48 tp 72 hour turn around time, please advise   [id number] - [place], [place] - [address] [phone] (Phone) [phone] (Fax)

## 2018-10-14 ENCOUNTER — Other Ambulatory Visit: Payer: Self-pay | Admitting: Obstetrics and Gynecology

## 2018-10-14 MED ORDER — ESTRADIOL 0.5 MG PO TABS: 0.5000 mg | ORAL_TABLET | Freq: Every day | ORAL | 0 refills | Status: DC

## 2018-10-28 DIAGNOSIS — R69 Illness, unspecified: Secondary | ICD-10-CM | POA: Diagnosis not present

## 2018-10-29 ENCOUNTER — Encounter: Payer: Self-pay | Admitting: Family Medicine

## 2018-10-29 ENCOUNTER — Other Ambulatory Visit: Payer: Self-pay

## 2018-10-29 ENCOUNTER — Ambulatory Visit (INDEPENDENT_AMBULATORY_CARE_PROVIDER_SITE_OTHER): Payer: Medicare HMO | Admitting: Family Medicine

## 2018-10-29 VITALS — BP 130/80 | HR 85 | Temp 96.9°F | Resp 16 | Ht 64.0 in | Wt 183.5 lb

## 2018-10-29 DIAGNOSIS — E669 Obesity, unspecified: Secondary | ICD-10-CM

## 2018-10-29 DIAGNOSIS — F325 Major depressive disorder, single episode, in full remission: Secondary | ICD-10-CM

## 2018-10-29 DIAGNOSIS — R748 Abnormal levels of other serum enzymes: Secondary | ICD-10-CM | POA: Diagnosis not present

## 2018-10-29 DIAGNOSIS — M19041 Primary osteoarthritis, right hand: Secondary | ICD-10-CM | POA: Diagnosis not present

## 2018-10-29 DIAGNOSIS — K219 Gastro-esophageal reflux disease without esophagitis: Secondary | ICD-10-CM

## 2018-10-29 DIAGNOSIS — K760 Fatty (change of) liver, not elsewhere classified: Secondary | ICD-10-CM | POA: Diagnosis not present

## 2018-10-29 DIAGNOSIS — R69 Illness, unspecified: Secondary | ICD-10-CM | POA: Diagnosis not present

## 2018-10-29 DIAGNOSIS — I10 Essential (primary) hypertension: Secondary | ICD-10-CM

## 2018-10-29 DIAGNOSIS — E782 Mixed hyperlipidemia: Secondary | ICD-10-CM | POA: Diagnosis not present

## 2018-10-29 DIAGNOSIS — Z23 Encounter for immunization: Secondary | ICD-10-CM

## 2018-10-29 DIAGNOSIS — F419 Anxiety disorder, unspecified: Secondary | ICD-10-CM | POA: Diagnosis not present

## 2018-10-29 DIAGNOSIS — M19042 Primary osteoarthritis, left hand: Secondary | ICD-10-CM

## 2018-10-29 MED ORDER — DIAZEPAM 5 MG PO TABS: 5.0000 mg | ORAL_TABLET | Freq: Every day | ORAL | 0 refills | Status: DC | PRN

## 2018-10-29 NOTE — Progress Notes (Signed)
Name: Kari Lee   MRN: [id number]    DOB: 12-04-1945   Date:10/29/2018       Progress Note  Subjective  Chief Complaint  Chief Complaint  Patient presents with  . Medication Refill  . Hypertension    Denies any symptoms  . Depression  . Hyperlipidemia  . Anxiety  . Osteoarthritis    HPI  HTN she is taking medication and denies side effects. BP is at goal today , losartan is working well for her.   Bradycardia: she states her heart rate is always slow and seen by cardiologist and negative evaluation. Today her heart rate is normal, she states usually 50-60's but thinks high because she has been fasting today   Anxiety:she is doing well, no recent problems, she is out of her Valium but she would like to get 5 more for the holidays, since the holidays seems to make her feel more anxious   GERD/: she is off PPI and bowel movements are back to normal, she decided not to go to GI at this time. She is doing well    Obesity:she joined [employer] 05/26/2017, she started at 200.2 lbs and she was down to 173 lbs but since COVID-19 is up to 183.5lbs  She states weight watchers no longer being in person, only online. She has downloaded weight watchers to her phone, but not doing online meeting,  And she  will start walking again.    Hot Flashes: shesaw Dr. Chauncey Cruel she was noticingdryness in her face and also hot flashes,she states half dose of estrogen is controlling her symptoms. She tried to stop but it caused hot flashes   Elevated liver enzymes: over 100's , seen by Dr. Lars Pinks, US showed fatty liver, changed diet and lost weight, liver enzymes normalized.Last labs was normal 06/2018   OA: most symptoms now on her hands, but has a history of knee replacement and left hip, has DIP deformities and also inability to make a tight fist.She is taking naproxen twice daily, discussed risk of nsaids use , she also takes Tylenol. She has decrease in function because unable to  grasp like she use to   Hyperlipidemia: she is on atorvastatin and is doing well at this time,she denies myalgia.Last level checked in June and LDL was excellent but triglycerides are little high, but we will hold off and repeat next year   Major Depression: she has a long history of depression in the past had a lot of lack of motivation and felt hopeless, but currently not having those problems.She lost a friend that had cancer, she died in 21-Aug-2022 but she is coping well    Patient Active Problem List   Diagnosis Date Noted  . Mild major depression (HCC) 11/19/2016  . Traumatic closed nondisplaced fracture of one rib of right side 08/22/2016  . Osteoarthritis of both hands 08/19/2016  . Fatty liver disease, nonalcoholic 05/30/2016  . Bradycardia 05/20/2016  . Current long-term use of postmenopausal hormone replacement therapy 05/20/2016  . GERD (gastroesophageal reflux disease) 05/20/2016  . Hyperlipidemia, unspecified 05/20/2016  . Hypertension 05/20/2016  . Insomnia 05/20/2016  . Arthritis 05/20/2016  . Anxiety 05/20/2016  . Rosacea 05/20/2016  . History of hip replacement, total, 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    Past Surgical History:  Procedure Laterality Date  . CERVICAL CONE BIOPSY    . COLONOSCOPY  10/2010  . COLONOSCOPY  2004  . endoscopic carpal tunn Bilateral   . HALLUX VALGUS CORRECTION Right 2005  . HYSTERECTOMY  ABDOMINAL WITH SALPINGECTOMY  1981  . INCISION TENDON SHEATH HAND Right   . JOINT REPLACEMENT Right 11/21/2010   knee  . JOINT REPLACEMENT Left 11/04/2011   knee  . JOINT REPLACEMENT Left 04/27/2012   hip   . KNEE ARTHROSCOPY Right   . KNEE ARTHROSCOPY Left 12/18/2011    Family History  Problem Relation Age of Onset  . Alzheimer's disease Mother 68  . Osteoporosis Mother   . Stroke Father 74  . Arthritis Sister   . Basal cell carcinoma Sister   . Macular degeneration Other   . Breast cancer Maternal Aunt 78    Social History   Socioeconomic History   . Marital status: Married    Spouse name: Maisie Fus  . Number of children: 2  . Years of education: Not on file  . Highest education level: Associate degree: occupational, Scientist, product/process development, or vocational program  Occupational History  . Occupation: Retired    Comment: Engineer, civil (consulting)  Social Needs  . Financial resource strain: Not hard at all  . Food insecurity    Worry: Never true    Inability: Never true  . Transportation needs    Medical: No    Non-medical: No  Tobacco Use  . Smoking status: Never Smoker  . Smokeless tobacco: Never Used  Substance and Sexual Activity  . Alcohol use: Yes    Alcohol/week: 1.0 standard drinks    Types: 1 Glasses of wine per week  . Drug use: No  . Sexual activity: Yes    Partners: Male    Birth control/protection: None, Surgical, Post-menopausal  Lifestyle  . Physical activity    Days per week: 3 days    Minutes per session: 30 min  . Stress: Not at all  Relationships  . Social connections    Talks on phone: More than three times a week    Gets together: Twice a week    Attends religious service: More than 4 times per year    Active member of club or organization: Yes    Attends meetings of clubs or organizations: More than 4 times per year    Relationship status: Married  . Intimate partner violence    Fear of current or ex partner: No    Emotionally abused: No    Physically abused: No    Forced sexual activity: No  Other Topics Concern  . Not on file  Social History Narrative   She moved to [place] to downsize, but decided to move back to [place] after 2 years.    Married.   One daughter lives in [place].    The other daughter in [place].     Current Outpatient Medications:  .  acetaminophen (TYLENOL) 500 MG tablet, Take 1 tablet (500 mg total) by mouth 2 (two) times daily., Disp: 60 tablet, Rfl: 0 .  Ascorbic Acid (VITAMIN C) 1000 MG tablet, Take 1,000 mg by mouth daily., Disp: , Rfl:  .  atorvastatin (LIPITOR) 40 MG tablet, Take 1  tablet (40 mg total) by mouth daily at 6 PM., Disp: 90 tablet, Rfl: 1 .  diazepam (VALIUM) 5 MG tablet, Take 1 tablet (5 mg total) by mouth daily as needed for anxiety., Disp: 5 tablet, Rfl: 0 .  estradiol (ESTRACE) 0.5 MG tablet, Take 1 tablet (0.5 mg total) by mouth daily., Disp: 30 tablet, Rfl: 0 .  losartan (COZAAR) 50 MG tablet, Take 50 mg by mouth daily. Came back into stock-never picked up Valsartan, Disp: , Rfl:  .  Multiple Vitamin (MULTI-VITAMINS) TABS, Take by mouth., Disp: , Rfl:  .  naproxen (NAPROSYN) 250 MG tablet, Take 250 mg by mouth as needed., Disp: , Rfl:  .  Vitamin D, Cholecalciferol, 50 MCG (2000 UT) CAPS, Take 2,000 Units by mouth daily., Disp: , Rfl:  .  Melatonin 10 MG TABS, Take 1 tablet by mouth at bedtime as needed., Disp: , Rfl:  .  valsartan (DIOVAN) 80 MG tablet, Take 1 tablet (80 mg total) by mouth daily. (Patient not taking: Reported on 10/29/2018), Disp: 90 tablet, Rfl: 0  Allergies  Allergen Reactions  . Sulfa Antibiotics Nausea Only  . Penicillins Rash    I personally reviewed active problem list, medication list, allergies, family history, social history, health maintenance with the patient/caregiver today.   ROS  Constitutional: Negative for fever or weight change.  Respiratory: Negative for cough and shortness of breath.   Cardiovascular: Negative for chest pain or palpitations.  Gastrointestinal: Negative for abdominal pain, no bowel changes.  Musculoskeletal: Negative for gait problem , positive for joint swelling.  Skin: Negative for rash.  Neurological: Negative for dizziness or headache.  No other specific complaints in a complete review of systems (except as listed in HPI above).  Objective  Vitals:   10/29/18 1506  BP: 130/80  Pulse: 85  Resp: 16  Temp: (!) 96.9 F (36.1 C)  TempSrc: Temporal  SpO2: 98%  Weight: 183 lb 8 oz (83.2 kg)  Height: 5\' 4"  (1.626 m)    Body mass index is 31.5 kg/m.  Physical Exam  Constitutional:  Patient appears well-developed and well-nourished. Obese No distress.  HEENT: head atraumatic, normocephalic, pupils equal and reactive to light Cardiovascular: Normal rate, regular rhythm and normal heart sounds.  No murmur heard. No BLE edema. Pulmonary/Chest: Effort normal and breath sounds normal. No respiratory distress. Abdominal: Soft.  There is no tenderness. Psychiatric: Patient has a normal mood and affect. behavior is normal. Judgment and thought content normal.  PHQ2/9: Depression screen [facility] 2/9 10/29/2018 06/28/2018 02/19/2018 10/20/2017 10/20/2017  Decreased Interest 0 0 0 1 1  Down, Depressed, Hopeless 0 0 0 1 1  PHQ - 2 Score 0 0 0 2 2  Altered sleeping 0 0 1 0 0  Tired, decreased energy 0 0 0 1 1  Change in appetite 1 0 0 0 0  Feeling bad or failure about yourself  0 0 0 0 0  Trouble concentrating 0 0 1 0 0  Moving slowly or fidgety/restless 0 0 0 0 0  Suicidal thoughts 0 0 0 0 0  PHQ-9 Score 1 0 2 3 3   Difficult doing work/chores Not difficult at all Not difficult at all Not difficult at all Not difficult at all Not difficult at all    phq 9 is negative   Fall Risk: Fall Risk  10/29/2018 06/28/2018 02/19/2018 10/20/2017 07/20/2017  Falls in the past year? 0 0 1 No Yes  Number falls in past yr: 0 0 1 - 1  Comment - - tripped in yard, no injury - -  Injury with Fall? 0 0 0 - Yes  Comment - - - - slipped on ketchup in a restaurant- fracture ribs  Follow up - - Falls prevention discussed - -    Functional Status Survey: Is the patient deaf or have difficulty hearing?: No Does the patient have difficulty seeing, even when wearing glasses/contacts?: No Does the patient have difficulty concentrating, remembering, or making decisions?: No Does the patient have difficulty walking  or climbing stairs?: No Does the patient have difficulty dressing or bathing?: No Does the patient have difficulty doing errands alone such as visiting a doctor's office or shopping?:  No    Assessment & Plan  1. Essential hypertension  At goal   2. Major depression in remission [facility](HCC)  Doing well   3. Anxiety disorder, unspecified type  - diazepam (VALIUM) 5 MG tablet; Take 1 tablet (5 mg total) by mouth daily as needed for anxiety.  Dispense: 5 tablet; Refill: 0  4. Need for immunization against influenza  - Flu Vaccine QUAD High Dose(Fluad)  5. GERD without esophagitis  Doing well   6. Fatty liver  Last LFT at goal   7. Primary osteoarthritis of both hands  stable  8. Mixed hyperlipidemia   9. Abnormal liver enzymes  Resolved   10. Obesity, Class I, BMI 30.0-34.9 (see actual BMI)  Discussed with the patient the risk posed by an increased BMI. Discussed importance of portion control, calorie counting and at least 150 minutes of physical activity weekly. Avoid sweet beverages and drink more water. Eat at least 6 servings of fruit and vegetables daily

## 2018-11-05 ENCOUNTER — Other Ambulatory Visit: Payer: Self-pay | Admitting: Family Medicine

## 2018-11-05 NOTE — Telephone Encounter (Signed)
Requested medication (s) are due for refill today: yes  Requested medication (s) are on the active medication list:yes  Last refill:  10/14/2018  Future visit scheduled: yes  Notes to clinic:  Requesting 90 day supply   Requested Prescriptions  Pending Prescriptions Disp Refills   estradiol (ESTRACE) 0.5 MG tablet [Pharmacy Med Name: ESTRADIOL 0.5 MG TABLET] 90 tablet 1    Sig: TAKE 1 TABLET BY MOUTH EVERY DAY     OB/GYN:  Estrogens Passed - 11/05/2018  2:35 PM      Passed - Mammogram is up-to-date per Health Maintenance      Passed - Last BP in normal range    BP Readings from Last 1 Encounters:  10/29/18 130/80         Passed - Valid encounter within last 12 months    Recent Outpatient Visits          1 week ago Essential hypertension   [facility] [place], Drue Stager, MD   4 months ago Major depression in remission [facility])   [facility] Steele Sizer, MD   8 months ago Essential hypertension   [facility] Steele Sizer, MD   1 year ago Essential hypertension   [facility] Steele Sizer, MD   1 year ago Essential hypertension   [facility] Steele Sizer, MD      Future Appointments            In 2 months Ancil Boozer, Drue Stager, MD [facility] [place]   In 3 months  [facility], [facility]

## 2019-01-14 ENCOUNTER — Other Ambulatory Visit: Payer: Self-pay | Admitting: Family Medicine

## 2019-01-14 DIAGNOSIS — E782 Mixed hyperlipidemia: Secondary | ICD-10-CM

## 2019-01-18 DIAGNOSIS — H5213 Myopia, bilateral: Secondary | ICD-10-CM | POA: Diagnosis not present

## 2019-01-18 DIAGNOSIS — H35373 Puckering of macula, bilateral: Secondary | ICD-10-CM | POA: Diagnosis not present

## 2019-02-01 ENCOUNTER — Ambulatory Visit: Payer: Medicare HMO | Admitting: Family Medicine

## 2019-02-22 ENCOUNTER — Ambulatory Visit: Payer: Medicare HMO

## 2019-03-03 ENCOUNTER — Other Ambulatory Visit: Payer: Self-pay | Admitting: Family Medicine

## 2019-03-03 DIAGNOSIS — I10 Essential (primary) hypertension: Secondary | ICD-10-CM

## 2019-03-03 NOTE — Telephone Encounter (Signed)
Requested medication (s) are due for refill today: yes  Requested medication (s) are on the active medication list: yes  Last refill: 11/30/2018  Future visit scheduled: no  Notes to clinic:  historical provider    Requested Prescriptions  Pending Prescriptions Disp Refills   losartan (COZAAR) 50 MG tablet [Pharmacy Med Name: LOSARTAN POTASSIUM 50 MG TAB] 90 tablet 1    Sig: TAKE 1 TABLET BY MOUTH EVERY DAY      Cardiovascular:  Angiotensin Receptor Blockers Failed - 03/03/2019 12:50 PM      Failed - Cr in normal range and within 180 days    Creat  Date Value Ref Range Status  06/28/2018 0.63 0.60 - 0.93 mg/dL Final    Comment:    For patients >84 years of age, the reference limit for Creatinine is approximately 13% higher for people identified as African-American. .           Failed - K in normal range and within 180 days    Potassium  Date Value Ref Range Status  06/28/2018 4.0 3.5 - 5.3 mmol/L Final  05/20/2012 3.3 (L) 3.5 - 5.1 mmol/L Final          Passed - Patient is not pregnant      Passed - Last BP in normal range    BP Readings from Last 1 Encounters:  10/29/18 130/80          Passed - Valid encounter within last 6 months    Recent Outpatient Visits           4 months ago Essential hypertension   [facility] [place], Danna Hefty, MD   8 months ago Major depression in remission [facility])   [facility] Alba Cory, MD   1 year ago Essential hypertension   [facility] Alba Cory, MD   1 year ago Essential hypertension   [facility] Alba Cory, MD   1 year ago Essential hypertension   [facility] Alba Cory, MD

## 2019-04-20 ENCOUNTER — Telehealth: Payer: Self-pay | Admitting: Family Medicine

## 2019-04-20 NOTE — Telephone Encounter (Signed)
Left message for patient to schedule Annual Wellness Visit.  Please schedule with Nurse Health Advisor Victoria Britt, RN at [place]  

## 2019-05-17 ENCOUNTER — Ambulatory Visit (INDEPENDENT_AMBULATORY_CARE_PROVIDER_SITE_OTHER): Payer: Medicare HMO

## 2019-05-17 VITALS — Ht 64.0 in | Wt 193.0 lb

## 2019-05-17 DIAGNOSIS — Z0000 Encounter for general adult medical examination without abnormal findings: Secondary | ICD-10-CM | POA: Diagnosis not present

## 2019-05-17 DIAGNOSIS — Z1231 Encounter for screening mammogram for malignant neoplasm of breast: Secondary | ICD-10-CM | POA: Diagnosis not present

## 2019-05-17 NOTE — Progress Notes (Addendum)
Subjective:   Kari Lee is a 74 y.o. female who presents for Medicare Annual (Subsequent) preventive examination.  Virtual Visit via Telephone Note  I connected with Kari Lee on 05/17/19 at  8:40 AM EDT by telephone and verified that I am speaking with the correct person using two identifiers.  Medicare Annual Wellness visit completed telephonically due to Covid-19 pandemic.   Location: Patient: home Provider: office   I discussed the limitations, risks, security and privacy concerns of performing an evaluation and management service by telephone and the availability of in person appointments. The patient expressed understanding and agreed to proceed.  Some vital signs may be absent or patient reported.   Reather Littler, LPN    Review of Systems:   Cardiac Risk Factors include: advanced age (>60men, >5 women);dyslipidemia;hypertension;obesity (BMI >30kg/m2)     Objective:     Vitals: Ht 5\' 4"  (1.626 m)   Wt 193 lb (87.5 kg)   BMI 33.13 kg/m   Body mass index is 33.13 kg/m.  Advanced Directives 05/17/2019 02/19/2018 10/14/2017 06/25/2017 11/19/2016 09/23/2016 08/22/2016  Does Patient Have a Medical Advance Directive? Yes Yes Yes Yes Yes No Yes  Type of 08/24/2016 of [place];Living will Healthcare Power of [place];Living will Healthcare Power of [place];Living will Living will Healthcare Power of Attorney - Living will  Does patient want to make changes to medical advance directive? Yes (MAU/Ambulatory/Procedural Areas - Information given) - - - - - -  Copy of Healthcare Power of Attorney in Chart? No - copy requested No - copy requested - - Yes - -    Tobacco Social History   Tobacco Use  Smoking Status Never Smoker  Smokeless Tobacco Never Used     Counseling given: Not Answered   Clinical Intake:  Pre-visit preparation completed: Yes  Pain : No/denies pain     BMI - recorded: 33.13 Nutritional Status: BMI > 30   Obese Nutritional Risks: None Diabetes: No  How often do you need to have someone help you when you read instructions, pamphlets, or other written materials from your doctor or pharmacy?: 1 - Never  Interpreter Needed?: No  Information entered by :: [email or web address] LPN  Past Medical History:  Diagnosis Date  . Anxiety   . Arthritis   . Bradycardia   . Bronchitis   . GERD (gastroesophageal reflux disease)   . History of positive PPD 2009  . History of positive PPD 2009   INH through health dept CXR's needed for clearance  . Hyperlipidemia   . Hypertension   . Insomnia   . Lumbar spondylosis    Managed by Dr. 2010 with [facility]  . Obesity   . Rosacea    Past Surgical History:  Procedure Laterality Date  . CERVICAL CONE BIOPSY    . COLONOSCOPY  10/2010  . COLONOSCOPY  2004  . endoscopic carpal tunn Bilateral   . HALLUX VALGUS CORRECTION Right 2005  . HYSTERECTOMY ABDOMINAL WITH SALPINGECTOMY  1981  . INCISION TENDON SHEATH HAND Right   . JOINT REPLACEMENT Right 11/21/2010   knee  . JOINT REPLACEMENT Left 11/04/2011   knee  . JOINT REPLACEMENT Left 04/27/2012   hip   . KNEE ARTHROSCOPY Right   . KNEE ARTHROSCOPY Left 12/18/2011   Family History  Problem Relation Age of Onset  . Alzheimer's disease Mother 24  . Osteoporosis Mother   . Stroke Father 97  . Arthritis Sister   . Basal cell carcinoma  Sister   . Macular degeneration Other   . Breast cancer Maternal Aunt 80   Social History   Socioeconomic History  . Marital status: Married    Spouse name: Marcello Moores  . Number of children: 2  . Years of education: Not on file  . Highest education level: Associate degree: occupational, Hotel manager, or vocational program  Occupational History  . Occupation: Retired    Comment: Marine scientist  Tobacco Use  . Smoking status: Never Smoker  . Smokeless tobacco: Never Used  Substance and Sexual Activity  . Alcohol use: Yes    Alcohol/week: 1.0 standard drinks    Types: 1  Glasses of wine per week  . Drug use: No  . Sexual activity: Yes    Partners: Male    Birth control/protection: None, Surgical, Post-menopausal  Other Topics Concern  . Not on file  Social History Narrative   She moved to [place] to downsize, but decided to move back to [place] after 2 years.    Married.   One daughter lives in [place].    The other daughter in [place].   Social Determinants of Health   Financial Resource Strain: Low Risk   . Difficulty of Paying Living Expenses: Not hard at all  Food Insecurity: No Food Insecurity  . Worried About Charity fundraiser in the Last Year: Never true  . Ran Out of Food in the Last Year: Never true  Transportation Needs: No Transportation Needs  . Lack of Transportation (Medical): No  . Lack of Transportation (Non-Medical): No  Physical Activity: Insufficiently Active  . Days of Exercise per Week: 3 days  . Minutes of Exercise per Session: 30 min  Stress: No Stress Concern Present  . Feeling of Stress : Not at all  Social Connections: Not Isolated  . Frequency of Communication with Friends and Family: More than three times a week  . Frequency of Social Gatherings with Friends and Family: Twice a week  . Attends Religious Services: More than 4 times per year  . Active Member of Clubs or Organizations: Yes  . Attends Archivist Meetings: More than 4 times per year  . Marital Status: Married    Outpatient Encounter Medications as of 05/17/2019  Medication Sig  . acetaminophen (TYLENOL) 500 MG tablet Take 1 tablet (500 mg total) by mouth 2 (two) times daily.  . Ascorbic Acid (VITAMIN C) 1000 MG tablet Take 1,000 mg by mouth daily.  Marland Kitchen atorvastatin (LIPITOR) 40 MG tablet TAKE 1 TABLET (40 MG TOTAL) BY MOUTH DAILY AT 6 PM.  . estradiol (ESTRACE) 0.5 MG tablet TAKE 1 TABLET BY MOUTH EVERY DAY  . losartan (COZAAR) 50 MG tablet TAKE 1 TABLET BY MOUTH EVERY DAY  . naproxen (NAPROSYN) 250 MG tablet Take 250 mg by mouth as  needed.  . Vitamin D, Cholecalciferol, 50 MCG (2000 UT) CAPS Take 2,000 Units by mouth daily.  . diazepam (VALIUM) 5 MG tablet Take 1 tablet (5 mg total) by mouth daily as needed for anxiety. (Patient not taking: Reported on 05/17/2019)   No facility-administered encounter medications on file as of 05/17/2019.    Activities of Daily Living In your present state of health, do you have any difficulty performing the following activities: 05/17/2019 10/29/2018  Hearing? Y N  Comment declines hearing aids -  Vision? N N  Difficulty concentrating or making decisions? N N  Walking or climbing stairs? N N  Dressing or bathing? N N  Doing errands, shopping? N N  Preparing Food and eating ? N -  Using the Toilet? N -  In the past six months, have you accidently leaked urine? Y -  Comment wears pads for protection for slight leakage -  Do you have problems with loss of bowel control? N -  Managing your Medications? N -  Managing your Finances? N -  Housekeeping or managing your Housekeeping? N -  Some recent data might be hidden    Patient Care Team: Alba Cory, MD as PCP - General (Family Medicine) Midge Minium, MD as Consulting Physician (Gastroenterology) Kennedy Bucker, MD as Consulting Physician (Orthopedic Surgery) Alwyn Pea, MD as Consulting Physician (Cardiology)    Assessment:   This is a routine wellness examination for Cecily.  Exercise Activities and Dietary recommendations Current Exercise Habits: Home exercise routine, Type of exercise: walking, Time (Minutes): 30, Frequency (Times/Week): 3, Weekly Exercise (Minutes/Week): 90, Intensity: Mild, Exercise limited by: None identified  Goals    . Weight (lb) < 200 lb (90.7 kg)     Pt would like to lose 20 lbs this year and increase physical activity.        Fall Risk Fall Risk  05/17/2019 10/29/2018 06/28/2018 02/19/2018 10/20/2017  Falls in the past year? 1 0 0 1 No  Number falls in past yr: 0 0 0 1 -  Comment - -  - tripped in yard, no injury -  Injury with Fall? 0 0 0 0 -  Comment - - - - -  Risk for fall due to : No Fall Risks - - - -  Follow up Falls prevention discussed - - Falls prevention discussed -   FALL RISK PREVENTION PERTAINING TO THE HOME:  Any stairs in or around the home? Yes  If so, do they handrails? Yes   Home free of loose throw rugs in walkways, pet beds, electrical cords, etc? Yes  Adequate lighting in your home to reduce risk of falls? Yes   ASSISTIVE DEVICES UTILIZED TO PREVENT FALLS:  Life alert? No  Use of a cane, walker or w/c? No  Grab bars in the bathroom? Yes  Shower chair or bench in shower? No  Elevated toilet seat or a handicapped toilet? No   DME ORDERS:  DME order needed?  No   TIMED UP AND GO:  Was the test performed? No . Telephonic visit  Education: Fall risk prevention has been discussed.  Intervention(s) required? No    Depression Screen PHQ 2/9 Scores 05/17/2019 10/29/2018 06/28/2018 02/19/2018  PHQ - 2 Score 0 0 0 0  PHQ- 9 Score - 1 0 2     Cognitive Function 6CIT deferred for 2021 AWV. Pt has no memory issues.      6CIT Screen 02/19/2018  What Year? 0 points  What month? 0 points  What time? 0 points  Count back from 20 0 points  Months in reverse 0 points  Repeat phrase 2 points  Total Score 2    Immunization History  Administered Date(s) Administered  . Fluad Quad(high Dose 65+) 10/29/2018  . Influenza, High Dose Seasonal PF 11/19/2016, 10/20/2017  . Influenza-Unspecified 01/05/2015  . PFIZER SARS-COV-2 Vaccination 03/09/2019, 03/30/2019  . Pneumococcal Conjugate-13 07/03/2014  . Pneumococcal Polysaccharide-23 11/03/2011, 11/19/2016  . Td 08/13/2009  . Zoster 02/19/2012    Qualifies for Shingles Vaccine? Yes . Due for Shingrix. Education has been provided regarding the importance of this vaccine. Pt has been advised to call insurance company to determine out of pocket expense. Advised  may also receive vaccine at local  pharmacy or Health Dept. Verbalized acceptance and understanding.  Tdap: Up to date  Flu Vaccine: Up to date  Pneumococcal Vaccine: Up to date   Screening Tests Health Maintenance  Topic Date Due  . MAMMOGRAM  03/11/2019  . TETANUS/TDAP  08/14/2019  . INFLUENZA VACCINE  08/28/2019  . COLONOSCOPY  10/27/2020  . DEXA SCAN  Completed  . COVID-19 Vaccine  Completed  . Hepatitis C Screening  Completed  . PNA vac Low Risk Adult  Completed    Cancer Screenings:  Colorectal Screening: Completed 10/28/10. Repeat every 10 years;  Mammogram: Completed 03/10/18. Repeat every year. Ordered today. Pt provided with contact information and advised to call to schedule appt.   Bone Density: Completed 11/03/17. Results reflect NORMAL. Repeat every 2 years.   Lung Cancer Screening: (Low Dose CT Chest recommended if Age 24-80 years, 30 pack-year currently smoking OR have quit w/in 15years.) does not qualify.    Additional Screening:  Hepatitis C Screening: does qualify; Completed 05/30/16  Vision Screening: Recommended annual ophthalmology exams for early detection of glaucoma and other disorders of the eye. Is the patient up to date with their annual eye exam?  Yes  Who is the provider or what is the name of the office in which the pt attends annual eye exams? [facility]  Dental Screening: Recommended annual dental exams for proper oral hygiene  Community Resource Referral:  CRR required this visit?  No      Plan:     I have personally reviewed and addressed the Medicare Annual Wellness questionnaire and have noted the following in the patient's chart:  A. Medical and social history B. Use of alcohol, tobacco or illicit drugs  C. Current medications and supplements D. Functional ability and status E.  Nutritional status F.  Physical activity G. Advance directives H. List of other physicians I.  Hospitalizations, surgeries, and ER visits in previous 12 months J.   Vitals K. Screenings such as hearing and vision if needed, cognitive and depression L. Referrals and appointments   In addition, I have reviewed and discussed with patient certain preventive protocols, quality metrics, and best practice recommendations. A written personalized care plan for preventive services as well as general preventive health recommendations were provided to patient.   Signed,  Reather Littler, LPN Nurse Health Advisor   Nurse Notes: none

## 2019-05-17 NOTE — Patient Instructions (Signed)
Kari Lee , Thank you for taking time to come for your Medicare Wellness Visit. I appreciate your ongoing commitment to your health goals. Please review the following plan we discussed and let me know if I can assist you in the future.   Screening recommendations/referrals: Colonoscopy: done 10/28/10. Repeat in 2022.  Mammogram: done 03/10/18. Please call [phone] to schedule your mammogram.  Bone Density: done 11/03/17 Recommended yearly ophthalmology/optometry visit for glaucoma screening and checkup Recommended yearly dental visit for hygiene and checkup  Vaccinations: Influenza vaccine: done 10/29/18 Pneumococcal vaccine: done 11/19/16 Tdap vaccine: done 08/13/09 Shingles vaccine: Shingrix discussed. Please contact your pharmacy for coverage information.  Covid-19: done 03/09/19 7 03/30/19  Advanced directives: Advance directive discussed with you today. I have provided a copy for you to complete at home and have notarized. Once this is complete please bring a copy in to our office so we can scan it into your chart.  Conditions/risks identified: Recommend healthy eating and physical activity for desired weight loss  Next appointment: Please follow up in one year for your Medicare Annual Wellness visit.     Preventive Care 24 Years and Older, Female Preventive care refers to lifestyle choices and visits with your health care provider that can promote health and wellness. What does preventive care include?  A yearly physical exam. This is also called an annual well check.  Dental exams once or twice a year.  Routine eye exams. Ask your health care provider how often you should have your eyes checked.  Personal lifestyle choices, including:  Daily care of your teeth and gums.  Regular physical activity.  Eating a healthy diet.  Avoiding tobacco and drug use.  Limiting alcohol use.  Practicing safe sex.  Taking low-dose aspirin every day.  Taking vitamin and mineral  supplements as recommended by your health care provider. What happens during an annual well check? The services and screenings done by your health care provider during your annual well check will depend on your age, overall health, lifestyle risk factors, and family history of disease. Counseling  Your health care provider may ask you questions about your:  Alcohol use.  Tobacco use.  Drug use.  Emotional well-being.  Home and relationship well-being.  Sexual activity.  Eating habits.  History of falls.  Memory and ability to understand (cognition).  Work and work Astronomer.  Reproductive health. Screening  You may have the following tests or measurements:  Height, weight, and BMI.  Blood pressure.  Lipid and cholesterol levels. These may be checked every 5 years, or more frequently if you are over 64 years old.  Skin check.  Lung cancer screening. You may have this screening every year starting at age 79 if you have a 30-pack-year history of smoking and currently smoke or have quit within the past 15 years.  Fecal occult blood test (FOBT) of the stool. You may have this test every year starting at age 20.  Flexible sigmoidoscopy or colonoscopy. You may have a sigmoidoscopy every 5 years or a colonoscopy every 10 years starting at age 14.  Hepatitis C blood test.  Hepatitis B blood test.  Sexually transmitted disease (STD) testing.  Diabetes screening. This is done by checking your blood sugar (glucose) after you have not eaten for a while (fasting). You may have this done every 1-3 years.  Bone density scan. This is done to screen for osteoporosis. You may have this done starting at age 39.  Mammogram. This may be done every  1-2 years. Talk to your health care provider about how often you should have regular mammograms. Talk with your health care provider about your test results, treatment options, and if necessary, the need for more tests. Vaccines  Your  health care provider may recommend certain vaccines, such as:  Influenza vaccine. This is recommended every year.  Tetanus, diphtheria, and acellular pertussis (Tdap, Td) vaccine. You may need a Td booster every 10 years.  Zoster vaccine. You may need this after age 18.  Pneumococcal 13-valent conjugate (PCV13) vaccine. One dose is recommended after age 43.  Pneumococcal polysaccharide (PPSV23) vaccine. One dose is recommended after age 18. Talk to your health care provider about which screenings and vaccines you need and how often you need them. This information is not intended to replace advice given to you by your health care provider. Make sure you discuss any questions you have with your health care provider. Document Released: 02/09/2015 Document Revised: 10/03/2015 Document Reviewed: 11/14/2014 Elsevier Interactive Patient Education  2017 [place] Prevention in the Home Falls can cause injuries. They can happen to people of all ages. There are many things you can do to make your home safe and to help prevent falls. What can I do on the outside of my home?  Regularly fix the edges of walkways and driveways and fix any cracks.  Remove anything that might make you trip as you walk through a door, such as a raised step or threshold.  Trim any bushes or trees on the path to your home.  Use bright outdoor lighting.  Clear any walking paths of anything that might make someone trip, such as rocks or tools.  Regularly check to see if handrails are loose or broken. Make sure that both sides of any steps have handrails.  Any raised decks and porches should have guardrails on the edges.  Have any leaves, snow, or ice cleared regularly.  Use sand or salt on walking paths during winter.  Clean up any spills in your garage right away. This includes oil or grease spills. What can I do in the bathroom?  Use night lights.  Install grab bars by the toilet and in the tub and  shower. Do not use towel bars as grab bars.  Use non-skid mats or decals in the tub or shower.  If you need to sit down in the shower, use a plastic, non-slip stool.  Keep the floor dry. Clean up any water that spills on the floor as soon as it happens.  Remove soap buildup in the tub or shower regularly.  Attach bath mats securely with double-sided non-slip rug tape.  Do not have throw rugs and other things on the floor that can make you trip. What can I do in the bedroom?  Use night lights.  Make sure that you have a light by your bed that is easy to reach.  Do not use any sheets or blankets that are too big for your bed. They should not hang down onto the floor.  Have a firm chair that has side arms. You can use this for support while you get dressed.  Do not have throw rugs and other things on the floor that can make you trip. What can I do in the kitchen?  Clean up any spills right away.  Avoid walking on wet floors.  Keep items that you use a lot in easy-to-reach places.  If you need to reach something above you, use a strong  step stool that has a grab bar.  Keep electrical cords out of the way.  Do not use floor polish or wax that makes floors slippery. If you must use wax, use non-skid floor wax.  Do not have throw rugs and other things on the floor that can make you trip. What can I do with my stairs?  Do not leave any items on the stairs.  Make sure that there are handrails on both sides of the stairs and use them. Fix handrails that are broken or loose. Make sure that handrails are as long as the stairways.  Check any carpeting to make sure that it is firmly attached to the stairs. Fix any carpet that is loose or worn.  Avoid having throw rugs at the top or bottom of the stairs. If you do have throw rugs, attach them to the floor with carpet tape.  Make sure that you have a light switch at the top of the stairs and the bottom of the stairs. If you do not  have them, ask someone to add them for you. What else can I do to help prevent falls?  Wear shoes that:  Do not have high heels.  Have rubber bottoms.  Are comfortable and fit you well.  Are closed at the toe. Do not wear sandals.  If you use a stepladder:  Make sure that it is fully opened. Do not climb a closed stepladder.  Make sure that both sides of the stepladder are locked into place.  Ask someone to hold it for you, if possible.  Clearly mark and make sure that you can see:  Any grab bars or handrails.  First and last steps.  Where the edge of each step is.  Use tools that help you move around (mobility aids) if they are needed. These include:  Canes.  Walkers.  Scooters.  Crutches.  Turn on the lights when you go into a dark area. Replace any light bulbs as soon as they burn out.  Set up your furniture so you have a clear path. Avoid moving your furniture around.  If any of your floors are uneven, fix them.  If there are any pets around you, be aware of where they are.  Review your medicines with your doctor. Some medicines can make you feel dizzy. This can increase your chance of falling. Ask your doctor what other things that you can do to help prevent falls. This information is not intended to replace advice given to you by your health care provider. Make sure you discuss any questions you have with your health care provider. Document Released: 11/09/2008 Document Revised: 06/21/2015 Document Reviewed: 02/17/2014 Elsevier Interactive Patient Education  2017 [employer].

## 2019-05-23 ENCOUNTER — Other Ambulatory Visit: Payer: Self-pay | Admitting: Family Medicine

## 2019-05-23 NOTE — Telephone Encounter (Signed)
Requested Prescriptions  Pending Prescriptions Disp Refills  . 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 [facility])   [facility] Alba Cory, MD   1 year ago Essential hypertension   [facility] Alba Cory, MD   1 year ago Essential hypertension   [facility] Alba Cory, MD   1 year ago Essential hypertension   [facility] Alba Cory, MD      Future Appointments            In 4 weeks Alba Cory, MD [facility]   In 12 months  [facility]

## 2019-05-24 DIAGNOSIS — R69 Illness, unspecified: Secondary | ICD-10-CM | POA: Diagnosis not present

## 2019-05-30 ENCOUNTER — Ambulatory Visit
Admission: RE | Admit: 2019-05-30 | Discharge: 2019-05-30 | Disposition: A | Discharge status: Home / Self Care | Payer: Medicare HMO | Source: Ambulatory Visit | Attending: Family Medicine | Admitting: Family Medicine

## 2019-05-30 ENCOUNTER — Other Ambulatory Visit: Payer: Self-pay

## 2019-05-30 DIAGNOSIS — Z1231 Encounter for screening mammogram for malignant neoplasm of breast: Secondary | ICD-10-CM | POA: Diagnosis not present

## 2019-06-21 ENCOUNTER — Encounter: Payer: Self-pay | Admitting: Family Medicine

## 2019-06-21 ENCOUNTER — Ambulatory Visit (INDEPENDENT_AMBULATORY_CARE_PROVIDER_SITE_OTHER): Payer: Medicare HMO | Admitting: Family Medicine

## 2019-06-21 ENCOUNTER — Other Ambulatory Visit: Payer: Self-pay

## 2019-06-21 VITALS — BP 118/80 | HR 76 | Temp 97.3°F | Resp 14 | Ht 64.0 in | Wt 188.1 lb

## 2019-06-21 DIAGNOSIS — R748 Abnormal levels of other serum enzymes: Secondary | ICD-10-CM | POA: Diagnosis not present

## 2019-06-21 DIAGNOSIS — Z0000 Encounter for general adult medical examination without abnormal findings: Secondary | ICD-10-CM

## 2019-06-21 DIAGNOSIS — M25551 Pain in right hip: Secondary | ICD-10-CM | POA: Diagnosis not present

## 2019-06-21 DIAGNOSIS — E559 Vitamin D deficiency, unspecified: Secondary | ICD-10-CM | POA: Diagnosis not present

## 2019-06-21 DIAGNOSIS — E669 Obesity, unspecified: Secondary | ICD-10-CM

## 2019-06-21 DIAGNOSIS — M19042 Primary osteoarthritis, left hand: Secondary | ICD-10-CM

## 2019-06-21 DIAGNOSIS — K760 Fatty (change of) liver, not elsewhere classified: Secondary | ICD-10-CM

## 2019-06-21 DIAGNOSIS — I10 Essential (primary) hypertension: Secondary | ICD-10-CM | POA: Diagnosis not present

## 2019-06-21 DIAGNOSIS — E782 Mixed hyperlipidemia: Secondary | ICD-10-CM | POA: Diagnosis not present

## 2019-06-21 DIAGNOSIS — R001 Bradycardia, unspecified: Secondary | ICD-10-CM

## 2019-06-21 DIAGNOSIS — R69 Illness, unspecified: Secondary | ICD-10-CM | POA: Diagnosis not present

## 2019-06-21 DIAGNOSIS — M19041 Primary osteoarthritis, right hand: Secondary | ICD-10-CM

## 2019-06-21 DIAGNOSIS — M25552 Pain in left hip: Secondary | ICD-10-CM

## 2019-06-21 DIAGNOSIS — F325 Major depressive disorder, single episode, in full remission: Secondary | ICD-10-CM

## 2019-06-21 DIAGNOSIS — K219 Gastro-esophageal reflux disease without esophagitis: Secondary | ICD-10-CM | POA: Diagnosis not present

## 2019-06-21 MED ORDER — LOSARTAN POTASSIUM 50 MG PO TABS: 50.0000 mg | ORAL_TABLET | Freq: Every day | ORAL | 1 refills | Status: DC

## 2019-06-21 MED ORDER — ATORVASTATIN CALCIUM 40 MG PO TABS: 40.0000 mg | ORAL_TABLET | Freq: Every day | ORAL | 1 refills | Status: DC

## 2019-06-21 NOTE — Addendum Note (Signed)
Addended by: Davene Costain on: 06/21/2019 03:40 PM   Modules accepted: Orders

## 2019-06-21 NOTE — Progress Notes (Addendum)
Name: Kari Lee   MRN: [id number]    DOB: 18-Sep-1945   Date:06/21/2019       Progress Note  Subjective  Chief Complaint  Chief Complaint  Patient presents with  . Annual Exam    HPI  Patient presents for annual CPE and follow up  HTN she is taking medication and denies side effects. BP is at goal today , losartan is working well for her, she denies chest pain or palpitation .   Bradycardia: she states her heart rate is always slow and seen by cardiologist and negative evaluation.Unchanged   GERD/: she is off PPI and bowel movements are back to normal, she denies heartburn or indigestion.    Obesity:she joined [employer] 05/26/2017, she started at 200.2 lbs andshewas down to 173 lbsbut since COVID-19 is up to 193 lbs She is back on [employer] on her own, counting points and weight has been going down again, trying to lose weight.   Hot Flashes: shesaw Dr. Star Age she was noticingdryness in her face and also hot flashes, She tried to stop but it caused hot flashes   Elevated liver enzymes: over 100's , seen by Dr. Durwin Reges, US showed fatty liver, changed diet and lost weight, liver enzymes normalized.Last labs was normal 06/2018 , we will recheck it today   OA: most symptoms now on her hands, but has a history of knee replacement and left hip, has DIP deformities and also inability to make a tight fist, also noticing some pain on wristShe is taking naproxen twice daily, discussed risk of nsaids use , she also takes Tylenol. She has decrease in function because unable to grasp like . She has noticed pain on outer hips for weeks, she has noticed difficulty going up stairs and getting out of chairs. She states last week she was getting up from her toilet and felt a pulling sensation on right outer hip. No fever, chills or pain on groin. She states sudden change. Discussed polymyalgia rheumatica and discussed getting some inflammatory markers  Hyperlipidemia: she is  on atorvastatin and is doing well at this time,she denies myalgia.Last level checked in June and LDL was excellent but triglycerides are little high, we will recheck labs today   Major Depression: she has a long history of depression in the past had a lot of lack of motivation and felt hopeless, but currently not having those problems.She states she has always been a [place], but she states she moved to [place] and moved back and it was hard moving back to a bigger house and more maintenance.    Diet: she has been eating healthy again  Exercise: she states she has not been as active lately, hips are sore   USPSTF grade A and B recommendations    [facility] Visit from 06/21/2019 in [facility]  AUDIT-C Score  2     Depression: Phq 9 is  negative Depression screen [facility] 2/9 06/21/2019 05/17/2019 10/29/2018 06/28/2018 02/19/2018  Decreased Interest 0 0 0 0 0  Down, Depressed, Hopeless 0 0 0 0 0  PHQ - 2 Score 0 0 0 0 0  Altered sleeping 0 - 0 0 1  Tired, decreased energy 0 - 0 0 0  Change in appetite 0 - 1 0 0  Feeling bad or failure about yourself  0 - 0 0 0  Trouble concentrating 0 - 0 0 1  Moving slowly or fidgety/restless 0 - 0 0 0  Suicidal thoughts 0 -  0 0 0  PHQ-9 Score 0 - 1 0 2  Difficult doing work/chores Not difficult at all - Not difficult at all Not difficult at all Not difficult at all  Some recent data might be hidden   Hypertension: BP Readings from Last 3 Encounters:  06/21/19 118/80  10/29/18 130/80  06/28/18 116/70   Obesity: Wt Readings from Last 3 Encounters:  06/21/19 188 lb 1.6 oz (85.3 kg)  05/17/19 193 lb (87.5 kg)  10/29/18 183 lb 8 oz (83.2 kg)   BMI Readings from Last 3 Encounters:  06/21/19 32.29 kg/m  05/17/19 33.13 kg/m  10/29/18 31.50 kg/m     Hep C Screening: up to date  STD testing and prevention (HIV/chl/gon/syphilis): not interested  Intimate partner violence: negative screen  Sexual History :  married,  post-menopausal  Pain during Intercourse: no pain  Menstrual History/LMP/Abnormal Bleeding: discussed importance of follow up if any post-menopausal bleeding  Incontinence Symptoms: some stress incontinence, some overflow wears a pad, discussed kegel exercises   Breast cancer:  - Last Mammogram: normal mammogram  - BRCA gene screening: N/A  Osteoporosis: Discussed high calcium and vitamin D supplementation, weight bearing exercises  Cervical cancer screening: not indicated  Skin cancer: Discussed monitoring for atypical lesions  Colorectal cancer: repeat 2022 Lung cancer:   Low Dose CT Chest recommended if Age 14-80 years, 30 pack-year currently smoking OR have quit w/in 15years. Patient does not qualify.   ECG: today   Advanced Care Planning: A voluntary discussion about advance care planning including the explanation and discussion of advance directives.  Discussed health care proxy and Living will, and the patient was able to identify a health care proxy as husband .  Patient does not have a living will at present time.   Lipids: Lab Results  Component Value Date   CHOL 163 06/28/2018   CHOL 166 07/20/2017   CHOL 182 03/24/2017   Lab Results  Component Value Date   HDL 62 06/28/2018   HDL 64 07/20/2017   HDL 71 03/24/2017   Lab Results  Component Value Date   LDLCALC 68 06/28/2018   LDLCALC 79 07/20/2017   LDLCALC 93 03/24/2017   Lab Results  Component Value Date   TRIG 237 (H) 06/28/2018   TRIG 133 07/20/2017   TRIG 89 03/24/2017   Lab Results  Component Value Date   CHOLHDL 2.6 06/28/2018   CHOLHDL 2.6 07/20/2017   CHOLHDL 2.6 03/24/2017   No results found for: LDLDIRECT  Glucose: Glucose  Date Value Ref Range Status  05/20/2012 125 (H) 65 - 99 mg/dL Final  05/19/2012 93 65 - 99 mg/dL Final  05/11/2012 89 65 - 99 mg/dL Final   Glucose, Bld  Date Value Ref Range Status  06/28/2018 85 65 - 99 mg/dL Final    Comment:    .            Fasting reference  interval .   02/19/2018 91 65 - 99 mg/dL Final    Comment:    .            Fasting reference interval .   07/20/2017 100 (H) 65 - 99 mg/dL Final    Comment:    .            Fasting reference interval . For someone without known diabetes, a glucose value between 100 and 125 mg/dL is consistent with prediabetes and should be confirmed with a follow-up test. .     Patient Active  Problem List   Diagnosis Date Noted  . Mild major depression ([place]) 11/19/2016  . Traumatic closed nondisplaced fracture of one rib of right side 08/22/2016  . Osteoarthritis of both hands 08/19/2016  . Fatty liver disease, nonalcoholic 05/39/7673  . Bradycardia 05/20/2016  . Current long-term use of postmenopausal hormone replacement therapy 05/20/2016  . GERD (gastroesophageal reflux disease) 05/20/2016  . Hyperlipidemia, unspecified 05/20/2016  . Hypertension 05/20/2016  . Insomnia 05/20/2016  . Arthritis 05/20/2016  . Anxiety 05/20/2016  . Rosacea 05/20/2016  . History of hip replacement, total, 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    Past Surgical History:  Procedure Laterality Date  . CERVICAL CONE BIOPSY    . COLONOSCOPY  10/2010  . COLONOSCOPY  2004  . endoscopic carpal tunn Bilateral   . HALLUX VALGUS CORRECTION Right 2005  . HYSTERECTOMY ABDOMINAL WITH SALPINGECTOMY  1981  . INCISION TENDON SHEATH HAND Right   . JOINT REPLACEMENT Right 11/21/2010   knee  . JOINT REPLACEMENT Left 11/04/2011   knee  . JOINT REPLACEMENT Left 04/27/2012   hip   . KNEE ARTHROSCOPY Right   . KNEE ARTHROSCOPY Left 12/18/2011    Family History  Problem Relation Age of Onset  . Alzheimer's disease Mother 36  . Osteoporosis Mother   . Stroke Father 72  . Arthritis Sister   . Basal cell carcinoma Sister   . Macular degeneration Other   . Breast cancer Maternal Aunt 80    Social History   Socioeconomic History  . Marital status: Married    Spouse name: Marcello Moores  . Number of children: 2  . Years of education: Not  on file  . Highest education level: Associate degree: occupational, Hotel manager, or vocational program  Occupational History  . Occupation: Retired    Comment: Marine scientist  Tobacco Use  . Smoking status: Never Smoker  . Smokeless tobacco: Never Used  Substance and Sexual Activity  . Alcohol use: Yes    Alcohol/week: 1.0 standard drinks    Types: 1 Glasses of wine per week  . Drug use: No  . Sexual activity: Yes    Partners: Male    Birth control/protection: None, Surgical, Post-menopausal  Other Topics Concern  . Not on file  Social History Narrative   She moved to [place] to downsize, but decided to move back to [place] after 2 years.    Married.   One daughter lives in [place].    The other daughter in [place].   Social Determinants of Health   Financial Resource Strain: Low Risk   . Difficulty of Paying Living Expenses: Not hard at all  Food Insecurity: No Food Insecurity  . Worried About Charity fundraiser in the Last Year: Never true  . Ran Out of Food in the Last Year: Never true  Transportation Needs: No Transportation Needs  . Lack of Transportation (Medical): No  . Lack of Transportation (Non-Medical): No  Physical Activity: Insufficiently Active  . Days of Exercise per Week: 3 days  . Minutes of Exercise per Session: 30 min  Stress: No Stress Concern Present  . Feeling of Stress : Not at all  Social Connections: Not Isolated  . Frequency of Communication with Friends and Family: More than three times a week  . Frequency of Social Gatherings with Friends and Family: Twice a week  . Attends Religious Services: More than 4 times per year  . Active Member of Clubs or Organizations: Yes  . Attends Archivist Meetings: More than 4 times per  year  . Marital Status: Married  Human resources officer Violence: Not At Risk  . Fear of Current or Ex-Partner: No  . Emotionally Abused: No  . Physically Abused: No  . Sexually Abused: No     Current Outpatient  Medications:  .  acetaminophen (TYLENOL) 500 MG tablet, Take 1 tablet (500 mg total) by mouth 2 (two) times daily., Disp: 60 tablet, Rfl: 0 .  Ascorbic Acid (VITAMIN C) 1000 MG tablet, Take 1,000 mg by mouth daily., Disp: , Rfl:  .  atorvastatin (LIPITOR) 40 MG tablet, Take 1 tablet (40 mg total) by mouth daily at 6 PM., Disp: 90 tablet, Rfl: 1 .  estradiol (ESTRACE) 0.5 MG tablet, TAKE 1 TABLET BY MOUTH EVERY DAY, Disp: 90 tablet, Rfl: 1 .  losartan (COZAAR) 50 MG tablet, Take 1 tablet (50 mg total) by mouth daily., Disp: 90 tablet, Rfl: 1 .  Multiple Vitamin (MULTIVITAMIN) tablet, Take 1 tablet by mouth daily., Disp: , Rfl:  .  naproxen (NAPROSYN) 250 MG tablet, Take 250 mg by mouth as needed., Disp: , Rfl:  .  Vitamin D, Cholecalciferol, 50 MCG (2000 UT) CAPS, Take 2,000 Units by mouth daily., Disp: , Rfl:   Allergies  Allergen Reactions  . Sulfa Antibiotics Nausea Only  . Penicillins Rash     ROS  Constitutional: Negative for fever, positive for  weight change.  Respiratory: Negative for cough and shortness of breath.   Cardiovascular: Negative for chest pain or palpitations.  Gastrointestinal: Negative for abdominal pain, no bowel changes.  Musculoskeletal: Positive for gait problem , positive for intermittent joint swelling.  Skin: Negative for rash.  Neurological: Negative for dizziness or headache.  No other specific complaints in a complete review of systems (except as listed in HPI above).  Objective  Vitals:   06/21/19 0929  BP: 118/80  Pulse: 76  Resp: 14  Temp: (!) 97.3 F (36.3 C)  SpO2: 97%  Weight: 188 lb 1.6 oz (85.3 kg)  Height: 5' 4"  (1.626 m)    Body mass index is 32.29 kg/m.  Physical Exam  Constitutional: Patient appears well-developed and well-nourished. Obese  No distress.  HEENT: head atraumatic, normocephalic, pupils equal and reactive to light,  neck supple Cardiovascular: Normal rate, regular rhythm and normal heart sounds.  No murmur  heard. No BLE edema. Pulmonary/Chest: Effort normal and breath sounds normal. No respiratory distress. Abdominal: Soft.  There is no tenderness. Muscular skeletal : difficulty getting up from chair, limping when first walking, normal rom of hips, pain during palpation of right trochanteric bursa .  Psychiatric: Patient has a normal mood and affect. behavior is normal. Judgment and thought content normal.  Fall Risk: Fall Risk  06/21/2019 05/17/2019 10/29/2018 06/28/2018 02/19/2018  Falls in the past year? 0 1 0 0 1  Number falls in past yr: 0 0 0 0 1  Comment - - - - tripped in yard, no injury  Injury with Fall? 0 0 0 0 0  Comment - - - - -  Risk for fall due to : - No Fall Risks - - -  Follow up - Falls prevention discussed - - Falls prevention discussed     Functional Status Survey: Is the patient deaf or have difficulty hearing?: Yes Does the patient have difficulty seeing, even when wearing glasses/contacts?: No Does the patient have difficulty concentrating, remembering, or making decisions?: No Does the patient have difficulty walking or climbing stairs?: No Does the patient have difficulty dressing or bathing?: No  Does the patient have difficulty doing errands alone such as visiting a doctor's office or shopping?: No   Assessment & Plan  1. Essential hypertension  - COMPLETE METABOLIC PANEL WITH GFR - CBC with Differential/Platelet  2. Primary osteoarthritis of both hands   3. GERD without esophagitis   4. Fatty liver  Recheck labs   5. Obesity, Class I, BMI 30.0-34.9 (see actual BMI)  Discussed with the patient the risk posed by an increased BMI. Discussed importance of portion control, calorie counting and at least 150 minutes of physical activity weekly. Avoid sweet beverages and drink more water. Eat at least 6 servings of fruit and vegetables daily   6. Major depression in remission ([place])   7. Abnormal liver enzymes   8. Bradycardia  stable  9.  Bilateral hip pain  - C-reactive protein - Sedimentation rate  10. Essential (primary) hypertension  - losartan (COZAAR) 50 MG tablet; Take 1 tablet (50 mg total) by mouth daily.  Dispense: 90 tablet; Refill: 1  11. Mixed hyperlipidemia  - atorvastatin (LIPITOR) 40 MG tablet; Take 1 tablet (40 mg total) by mouth daily at 6 PM.  Dispense: 90 tablet; Refill: 1 - Lipid panel  12. Vitamin D deficiency  - VITAMIN D 25 Hydroxy (Vit-D Deficiency, Fractures) -USPSTF grade A and B recommendations reviewed with patient; age-appropriate recommendations, preventive care, screening tests, etc discussed and encouraged; healthy living encouraged; see AVS for patient education given to patient -Discussed importance of 150 minutes of physical activity weekly, eat two servings of fish weekly, eat one serving of tree nuts ( cashews, pistachios, pecans, almonds.Marland Kitchen) every other day, eat 6 servings of fruit/vegetables daily and drink plenty of water and avoid sweet beverages.   44. Well adult exam

## 2019-06-21 NOTE — Patient Instructions (Addendum)
Preventive Care 72 Years and Older, Female Preventive care refers to lifestyle choices and visits with your health care provider that can promote health and wellness. This includes:  A yearly physical exam. This is also called an annual well check.  Regular dental and eye exams.  Immunizations.  Screening for certain conditions.  Healthy lifestyle choices, such as diet and exercise. What can I expect for my preventive care visit? Physical exam Your health care provider will check:  Height and weight. These may be used to calculate body mass index (BMI), which is a measurement that tells if you are at a healthy weight.  Heart rate and blood pressure.  Your skin for abnormal spots. Counseling Your health care provider may ask you questions about:  Alcohol, tobacco, and drug use.  Emotional well-being.  Home and relationship well-being.  Sexual activity.  Eating habits.  History of falls.  Memory and ability to understand (cognition).  Work and work Statistician.  Pregnancy and menstrual history. What immunizations do I need?  Influenza (flu) vaccine  This is recommended every year. Tetanus, diphtheria, and pertussis (Tdap) vaccine  You may need a Td booster every 10 years. Varicella (chickenpox) vaccine  You may need this vaccine if you have not already been vaccinated. Zoster (shingles) vaccine  You may need this after age 72. Pneumococcal conjugate (PCV13) vaccine  One dose is recommended after age 72. Pneumococcal polysaccharide (PPSV23) vaccine  One dose is recommended after age 72. Measles, mumps, and rubella (MMR) vaccine  You may need at least one dose of MMR if you were born in 1957 or later. You may also need a second dose. Meningococcal conjugate (MenACWY) vaccine  You may need this if you have certain conditions. Hepatitis A vaccine  You may need this if you have certain conditions or if you travel or work in places where you may be exposed  to hepatitis A. Hepatitis B vaccine  You may need this if you have certain conditions or if you travel or work in places where you may be exposed to hepatitis B. Haemophilus influenzae type b (Hib) vaccine  You may need this if you have certain conditions. You may receive vaccines as individual doses or as more than one vaccine together in one shot (combination vaccines). Talk with your health care provider about the risks and benefits of combination vaccines. What tests do I need? Blood tests  Lipid and cholesterol levels. These may be checked every 5 years, or more frequently depending on your overall health.  Hepatitis C test.  Hepatitis B test. Screening  Lung cancer screening. You may have this screening every year starting at age 72 if you have a 30-pack-year history of smoking and currently smoke or have quit within the past 15 years.  Colorectal cancer screening. All adults should have this screening starting at age 72 and continuing until age 15. Your health care provider may recommend screening at age 72 if you are at increased risk. You will have tests every 1-10 years, depending on your results and the type of screening test.  Diabetes screening. This is done by checking your blood sugar (glucose) after you have not eaten for a while (fasting). You may have this done every 1-3 years.  Mammogram. This may be done every 1-2 years. Talk with your health care provider about how often you should have regular mammograms.  BRCA-related cancer screening. This may be done if you have a family history of breast, ovarian, tubal, or peritoneal cancers.  Other tests  Sexually transmitted disease (STD) testing.  Bone density scan. This is done to screen for osteoporosis. You may have this done starting at age 72. Follow these instructions at home: Eating and drinking  Eat a diet that includes fresh fruits and vegetables, whole grains, lean protein, and low-fat dairy products. Limit  your intake of foods with high amounts of sugar, saturated fats, and salt.  Take vitamin and mineral supplements as recommended by your health care provider.  Do not drink alcohol if your health care provider tells you not to drink.  If you drink alcohol: ? Limit how much you have to 0-1 drink a day. ? Be aware of how much alcohol is in your drink. In the [place] one drink equals one 12 oz bottle of beer (355 mL), one 5 oz glass of wine (148 mL), or one 1 oz glass of hard liquor (44 mL). Lifestyle  Take daily care of your teeth and gums.  Stay active. Exercise for at least 30 minutes on 5 or more days each week.  Do not use any products that contain nicotine or tobacco, such as cigarettes, e-cigarettes, and chewing tobacco. If you need help quitting, ask your health care provider.  If you are sexually active, practice safe sex. Use a condom or other form of protection in order to prevent STIs (sexually transmitted infections).  Talk with your health care provider about taking a low-dose aspirin or statin. What's next?  Go to your health care provider once a year for a well check visit.  Ask your health care provider how often you should have your eyes and teeth checked.  Stay up to date on all vaccines. This information is not intended to replace advice given to you by your health care provider. Make sure you discuss any questions you have with your health care provider. Document Revised: 01/07/2018 Document Reviewed: 01/07/2018 Elsevier Patient Education  [employer] Polymyalgia rheumatica (PMR) is an inflammatory disorder that causes the muscles and joints to ache and become stiff. Sometimes, PMR leads to a more dangerous condition that can cause vision loss (temporal arteritis or giant cell arteritis). What are the causes? The exact cause of PMR is not known. What increases the risk? You are more likely to develop this condition if you  are:  Female.  18 years of age or older.  Caucasian. What are the signs or symptoms? Pain and stiffness are the main symptoms of PMR. Symptoms may:  Be worse after inactivity and in the morning.  Affect your: ? Hips, buttocks, and thighs. ? Neck, arms, and shoulders. This can make it hard to raise your arms above your head. ? Hands and wrists. Other symptoms include:  Fever.  Tiredness.  Weakness.  Depression.  Decreased appetite. This may lead to weight loss. Symptoms may start slowly or suddenly. How is this diagnosed? This condition is diagnosed with your medical history and a physical exam. You may need to see a health care provider who specializes in diseases of the joints, muscles, and bones (rheumatologist). You may also have tests, including:  Blood tests.  X-rays.  Ultrasound. How is this treated? PMR usually goes away without treatment, but it may take years. Your health care provider may recommend low-dose steroids and other medicines to help manage your symptoms of pain and stiffness. Regular exercise and rest will also help your symptoms. Follow these instructions at home:   Take over-the-counter and prescription medicines only as told  by your health care provider.  Make sure to get enough rest and sleep.  Eat a healthy and nutritious diet.  Try to exercise most days of the week. Ask your health care provider what type of exercise is best for you.  Keep all follow-up visits as told by your health care provider. This is important. Contact a health care provider if:  Your symptoms do not improve with medicine.  You have side effects from steroids. These may include: ? Weight gain. ? Swelling.  Hip Exercises Ask your health care provider which exercises are safe for you. Do exercises exactly as told by your health care provider and adjust them as directed. It is normal to feel mild stretching, pulling, tightness, or discomfort as you do these  exercises. Stop right away if you feel sudden pain or your pain gets worse. Do not begin these exercises until told by your health care provider. Stretching and range-of-motion exercises These exercises warm up your muscles and joints and improve the movement and flexibility of your hip. These exercises also help to relieve pain, numbness, and tingling. You may be asked to limit your range of motion if you had a hip replacement. Talk to your health care provider about these restrictions. Hamstrings, supine  Lie on your back (supine position). Loop a belt or towel over the ball of your left / right foot. The ball of your foot is on the walking surface, right under your toes. Straighten your left / right knee and slowly pull on the belt or towel to raise your leg until you feel a gentle stretch behind your knee (hamstring). Do not let your knee bend while you do this. Keep your other leg flat on the floor. Hold this position for __________ seconds. Slowly return your leg to the starting position. Repeat __________ times. Complete this exercise __________ times a day. Hip rotation  Lie on your back on a firm surface. With your left / right hand, gently pull your left / right knee toward the shoulder that is on the same side of the body. Stop when your knee is pointing toward the ceiling. Hold your left / right ankle with your other hand. Keeping your knee steady, gently pull your left / right ankle toward your other shoulder until you feel a stretch in your buttocks. Keep your hips and shoulders firmly planted while you do this stretch. Hold this position for __________ seconds. Repeat __________ times. Complete this exercise __________ times a day. Seated stretch This exercise is sometimes called hamstrings and adductors stretch. Sit on the floor with your legs stretched wide. Keep your knees straight during this exercise. Keeping your head and back in a straight line, bend at your waist to  reach for your left foot (position A). You should feel a stretch in your right inner thigh (adductors). Hold this position for __________ seconds. Then slowly return to the upright position. Keeping your head and back in a straight line, bend at your waist to reach forward (position B). You should feel a stretch behind both of your thighs and knees (hamstrings). Hold this position for __________ seconds. Then slowly return to the upright position. Keeping your head and back in a straight line, bend at your waist to reach for your right foot (position C). You should feel a stretch in your left inner thigh (adductors). Hold this position for __________ seconds. Then slowly return to the upright position. Repeat __________ times. Complete this exercise __________ times a day. Lunge  This exercise stretches the muscles of the hip (hip flexors). Place your left / right knee on the floor and bend your other knee so that is directly over your ankle. You should be half-kneeling. Keep good posture with your head over your shoulders. Tighten your buttocks to point your tailbone downward. This will prevent your back from arching too much. You should feel a gentle stretch in the front of your left / right thigh and hip. If you do not feel a stretch, slide your other foot forward slightly and then slowly lunge forward with your chest up until your knee once again lines up over your ankle. Make sure your tailbone continues to point downward. Hold this position for __________ seconds. Slowly return to the starting position. Repeat __________ times. Complete this exercise __________ times a day. Strengthening exercises These exercises build strength and endurance in your hip. Endurance is the ability to use your muscles for a long time, even after they get tired. Bridge This exercise strengthens the muscles of your hip (hip extensors). Lie on your back on a firm surface with your knees bent and your feet flat on  the floor. Tighten your buttocks muscles and lift your bottom off the floor until the trunk of your body and your hips are level with your thighs. Do not arch your back. You should feel the muscles working in your buttocks and the back of your thighs. If you do not feel these muscles, slide your feet 1-2 inches (2.5-5 cm) farther away from your buttocks. Hold this position for __________ seconds. Slowly lower your hips to the starting position. Let your muscles relax completely between repetitions. Repeat __________ times. Complete this exercise __________ times a day. Straight leg raises, side-lying This exercise strengthens the muscles that move the hip joint away from the center of the body (hip abductors). Lie on your side with your left / right leg in the top position. Lie so your head, shoulder, hip, and knee line up. You may bend your bottom knee slightly to help you balance. Roll your hips slightly forward, so your hips are stacked directly over each other and your left / right knee is facing forward. Leading with your heel, lift your top leg 4-6 inches (10-15 cm). You should feel the muscles in your top hip lifting. Do not let your foot drift forward. Do not let your knee roll toward the ceiling. Hold this position for __________ seconds. Slowly return to the starting position. Let your muscles relax completely between repetitions. Repeat __________ times. Complete this exercise __________ times a day. Straight leg raises, side-lying This exercise strengthens the muscles that move the hip joint toward the center of the body (hip adductors). Lie on your side with your left / right leg in the bottom position. Lie so your head, shoulder, hip, and knee line up. You may place your upper foot in front to help you balance. Roll your hips slightly forward, so your hips are stacked directly over each other and your left / right knee is facing forward. Tense the muscles in your inner thigh and  lift your bottom leg 4-6 inches (10-15 cm). Hold this position for __________ seconds. Slowly return to the starting position. Let your muscles relax completely between repetitions. Repeat __________ times. Complete this exercise __________ times a day. Straight leg raises, supine This exercise strengthens the muscles in the front of your thigh (quadriceps). Lie on your back (supine position) with your left / right leg extended and your  other knee bent. Tense the muscles in the front of your left / right thigh. You should see your kneecap slide up or see increased dimpling just above your knee. Keep these muscles tight as you raise your leg 4-6 inches (10-15 cm) off the floor. Do not let your knee bend. Hold this position for __________ seconds. Keep these muscles tense as you lower your leg. Relax the muscles slowly and completely between repetitions. Repeat __________ times. Complete this exercise __________ times a day. Hip abductors, standing This exercise strengthens the muscles that move the leg and hip joint away from the center of the body (hip abductors). Tie one end of a rubber exercise band or tubing to a secure surface, such as a chair, table, or pole. Loop the other end of the band or tubing around your left / right ankle. Keeping your ankle with the band or tubing directly opposite the secured end, step away until there is tension in the tubing or band. Hold on to a chair, table, or pole as needed for balance. Lift your left / right leg out to your side. While you do this: Keep your back upright. Keep your shoulders over your hips. Keep your toes pointing forward. Make sure to use your hip muscles to slowly lift your leg. Do not tip your body or forcefully lift your leg. Hold this position for __________ seconds. Slowly return to the starting position. Repeat __________ times. Complete this exercise __________ times a day. Squats This exercise strengthens the muscles in the  front of your thigh (quadriceps). Stand in a door frame so your feet and knees are in line with the frame. You may place your hands on the frame for balance. Slowly bend your knees and lower your hips like you are going to sit in a chair. Keep your lower legs in a straight-up-and-down position. Do not let your hips go lower than your knees. Do not bend your knees lower than told by your health care provider. If your hip pain increases, do not bend as low. Hold this position for ___________ seconds. Slowly push with your legs to return to standing. Do not use your hands to pull yourself to standing. Repeat __________ times. Complete this exercise __________ times a day. This information is not intended to replace advice given to you by your health care provider. Make sure you discuss any questions you have with your health care provider. Document Revised: 08/19/2018 Document Reviewed: 11/24/2017 Elsevier Patient Education  [employer]. ? Insomnia. ? Mood changes. ? Bruising. ? High blood sugar readings, if you have diabetes. ? Higher than normal blood pressure readings, if you monitor your blood pressure. ?  Get help right away if:  You develop symptoms of temporal arteritis, such as: ? A change in vision. ? Severe headache. ? Scalp pain. ? Jaw pain. Summary  Polymyalgia rheumatica is an inflammatory disorder that causes aching and stiffness in your muscles and joints.  The exact cause of this condition is not known.  This condition usually goes away without treatment. Your health care provider may give you low-dose steroids to help manage your pain and stiffness.  Rest and regular exercise will help the symptoms. This information is not intended to replace advice given to you by your health care provider. Make sure you discuss any questions you have with your health care provider. Document Revised: 11/19/2017 Document Reviewed: 11/19/2017 Elsevier Patient Education  2020  [employer].

## 2019-06-22 LAB — CBC WITH DIFFERENTIAL/PLATELET
Absolute Monocytes: 556 cells/uL (ref 200–950)
Basophils Absolute: 41 cells/uL (ref 0–200)
Basophils Relative: 0.8 %
Eosinophils Absolute: 275 cells/uL (ref 15–500)
Eosinophils Relative: 5.4 %
HCT: 46.1 % — ABNORMAL HIGH (ref 35.0–45.0)
Hemoglobin: 15.8 g/dL — ABNORMAL HIGH (ref 11.7–15.5)
Lymphs Abs: 1550 cells/uL (ref 850–3900)
MCH: 31.2 pg (ref 27.0–33.0)
MCHC: 34.3 g/dL (ref 32.0–36.0)
MCV: 91.1 fL (ref 80.0–100.0)
MPV: 11.5 fL (ref 7.5–12.5)
Monocytes Relative: 10.9 %
Neutro Abs: 2678 cells/uL (ref 1500–7800)
Neutrophils Relative %: 52.5 %
Platelets: 164 10*3/uL (ref 140–400)
RBC: 5.06 10*6/uL (ref 3.80–5.10)
RDW: 12.8 % (ref 11.0–15.0)
Total Lymphocyte: 30.4 %
WBC: 5.1 10*3/uL (ref 3.8–10.8)

## 2019-06-22 LAB — COMPLETE METABOLIC PANEL WITH GFR
AG Ratio: 2.1 (calc) (ref 1.0–2.5)
ALT: 23 U/L (ref 6–29)
AST: 24 U/L (ref 10–35)
Albumin: 4.5 g/dL (ref 3.6–5.1)
Alkaline phosphatase (APISO): 53 U/L (ref 37–153)
BUN: 18 mg/dL (ref 7–25)
CO2: 30 mmol/L (ref 20–32)
Calcium: 9.8 mg/dL (ref 8.6–10.4)
Chloride: 104 mmol/L (ref 98–110)
Creat: 0.78 mg/dL (ref 0.60–0.93)
GFR, Est African American: 89 mL/min/{1.73_m2} (ref >=60)
GFR, Est Non African American: 76 mL/min/{1.73_m2} (ref >=60)
Globulin: 2.1 g/dL (calc) (ref 1.9–3.7)
Glucose, Bld: 95 mg/dL (ref 65–99)
Potassium: 5.1 mmol/L (ref 3.5–5.3)
Sodium: 140 mmol/L (ref 135–146)
Total Bilirubin: 0.8 mg/dL (ref 0.2–1.2)
Total Protein: 6.6 g/dL (ref 6.1–8.1)

## 2019-06-22 LAB — SEDIMENTATION RATE: Sed Rate: 2 mm/h (ref 0–30)

## 2019-06-22 LAB — LIPID PANEL
Cholesterol: 155 mg/dL (ref <200)
HDL: 62 mg/dL (ref >=50)
LDL Cholesterol (Calc): 72 mg/dL (calc)
Non-HDL Cholesterol (Calc): 93 mg/dL (calc) (ref <130)
Total CHOL/HDL Ratio: 2.5 (calc) (ref <5.0)
Triglycerides: 133 mg/dL (ref <150)

## 2019-06-22 LAB — VITAMIN D 25 HYDROXY (VIT D DEFICIENCY, FRACTURES): Vit D, 25-Hydroxy: 33 ng/mL (ref 30–100)

## 2019-06-22 LAB — C-REACTIVE PROTEIN: CRP: 0.4 mg/L (ref <8.0)

## 2019-07-06 DIAGNOSIS — M9933 Osseous stenosis of neural canal of lumbar region: Secondary | ICD-10-CM | POA: Diagnosis not present

## 2019-07-07 ENCOUNTER — Other Ambulatory Visit: Payer: Self-pay | Admitting: Orthopedic Surgery

## 2019-07-07 DIAGNOSIS — M9933 Osseous stenosis of neural canal of lumbar region: Secondary | ICD-10-CM

## 2019-07-13 ENCOUNTER — Other Ambulatory Visit: Payer: Self-pay

## 2019-07-13 ENCOUNTER — Ambulatory Visit
Admission: RE | Admit: 2019-07-13 | Discharge: 2019-07-13 | Disposition: A | Discharge status: Home / Self Care | Payer: Medicare HMO | Source: Ambulatory Visit | Attending: Orthopedic Surgery | Admitting: Orthopedic Surgery

## 2019-07-13 DIAGNOSIS — M9933 Osseous stenosis of neural canal of lumbar region: Secondary | ICD-10-CM | POA: Insufficient documentation

## 2019-07-13 DIAGNOSIS — M545 Low back pain: Secondary | ICD-10-CM | POA: Diagnosis not present

## 2019-07-18 DIAGNOSIS — M9933 Osseous stenosis of neural canal of lumbar region: Secondary | ICD-10-CM | POA: Diagnosis not present

## 2019-07-18 DIAGNOSIS — M62559 Muscle wasting and atrophy, not elsewhere classified, unspecified thigh: Secondary | ICD-10-CM | POA: Diagnosis not present

## 2019-07-19 DIAGNOSIS — M5416 Radiculopathy, lumbar region: Secondary | ICD-10-CM | POA: Diagnosis not present

## 2019-07-19 DIAGNOSIS — M48061 Spinal stenosis, lumbar region without neurogenic claudication: Secondary | ICD-10-CM | POA: Diagnosis not present

## 2019-07-19 DIAGNOSIS — M5136 Other intervertebral disc degeneration, lumbar region: Secondary | ICD-10-CM | POA: Diagnosis not present

## 2019-07-27 DIAGNOSIS — M6281 Muscle weakness (generalized): Secondary | ICD-10-CM | POA: Diagnosis not present

## 2019-07-27 DIAGNOSIS — M48061 Spinal stenosis, lumbar region without neurogenic claudication: Secondary | ICD-10-CM | POA: Diagnosis not present

## 2019-08-10 DIAGNOSIS — M6281 Muscle weakness (generalized): Secondary | ICD-10-CM | POA: Diagnosis not present

## 2019-08-10 DIAGNOSIS — M47816 Spondylosis without myelopathy or radiculopathy, lumbar region: Secondary | ICD-10-CM | POA: Diagnosis not present

## 2019-08-10 DIAGNOSIS — M48061 Spinal stenosis, lumbar region without neurogenic claudication: Secondary | ICD-10-CM | POA: Diagnosis not present

## 2019-08-12 ENCOUNTER — Telehealth: Payer: Self-pay | Admitting: Family Medicine

## 2019-08-12 DIAGNOSIS — M6281 Muscle weakness (generalized): Secondary | ICD-10-CM | POA: Diagnosis not present

## 2019-08-12 DIAGNOSIS — M47816 Spondylosis without myelopathy or radiculopathy, lumbar region: Secondary | ICD-10-CM | POA: Diagnosis not present

## 2019-08-12 DIAGNOSIS — M48061 Spinal stenosis, lumbar region without neurogenic claudication: Secondary | ICD-10-CM | POA: Diagnosis not present

## 2019-08-12 NOTE — Chronic Care Management (AMB) (Signed)
  Chronic Care Management   Note  08/12/2019 Name: Kari Lee MRN: [id number] DOB: October 17, 1945  Kari Lee is a 75 y.o. year old female who is a primary care patient of Steele Sizer, MD. I reached out to Sharion Dove by phone today in response to a referral sent by Ms. Jim Like Sluder's health plan.     Ms. Wragg was given information about Chronic Care Management services today including:  1. CCM service includes personalized support from designated clinical staff supervised by her physician, including individualized plan of care and coordination with other care providers 2. 24/7 contact phone numbers for assistance for urgent and routine care needs. 3. Service will only be billed when office clinical staff spend 20 minutes or more in a month to coordinate care. 4. Only one practitioner may furnish and bill the service in a calendar month. 5. The patient may stop CCM services at any time (effective at the end of the month) by phone call to the office staff. 6. The patient will be responsible for cost sharing (co-pay) of up to 20% of the service fee (after annual deductible is met).  Patient did not agree to enrollment in care management services and does not wish to consider at this time.  Follow up plan: The patient has been provided with contact information for the care management team and has been advised to call with any health related questions or concerns.   Noreene Larsson, [address], [place], [place] [address] Direct Dial: [phone] [email or web address]: [email or web address]

## 2019-08-12 NOTE — Chronic Care Management (AMB) (Signed)
  Chronic Care Management   Outreach Note  08/12/2019 Name: Kari Lee MRN: [id number] DOB: Feb 07, 1945  Kari Lee is a 74 y.o. year old female who is a primary care patient of Alba Cory, MD. I reached out to [name] by phone today in response to a referral sent by Ms. Aggie Hacker Flaum's health plan.     An unsuccessful telephone outreach was attempted today. The patient was referred to the case management team for assistance with care management and care coordination.   Follow Up Plan: A HIPPA compliant phone message was left for the patient providing contact information and requesting a return call.  The care management team will reach out to the patient again over the next 7 days.  If patient returns call to provider office, please advise to call Embedded Care Management Care Guide Penne Lash  at [phone]  Penne Lash, RMA Care Guide, Embedded Care Coordination [facility]  [place], [place] [address] Direct Dial: [phone] [email or web address]: [email or web address]

## 2019-08-18 DIAGNOSIS — M47816 Spondylosis without myelopathy or radiculopathy, lumbar region: Secondary | ICD-10-CM | POA: Diagnosis not present

## 2019-08-18 DIAGNOSIS — M48061 Spinal stenosis, lumbar region without neurogenic claudication: Secondary | ICD-10-CM | POA: Diagnosis not present

## 2019-08-18 DIAGNOSIS — M6281 Muscle weakness (generalized): Secondary | ICD-10-CM | POA: Diagnosis not present

## 2019-08-19 DIAGNOSIS — M5136 Other intervertebral disc degeneration, lumbar region: Secondary | ICD-10-CM | POA: Diagnosis not present

## 2019-08-19 DIAGNOSIS — M5416 Radiculopathy, lumbar region: Secondary | ICD-10-CM | POA: Diagnosis not present

## 2019-08-19 DIAGNOSIS — M48061 Spinal stenosis, lumbar region without neurogenic claudication: Secondary | ICD-10-CM | POA: Diagnosis not present

## 2019-08-30 DIAGNOSIS — M47816 Spondylosis without myelopathy or radiculopathy, lumbar region: Secondary | ICD-10-CM | POA: Diagnosis not present

## 2019-08-30 DIAGNOSIS — M6281 Muscle weakness (generalized): Secondary | ICD-10-CM | POA: Diagnosis not present

## 2019-08-30 DIAGNOSIS — M48061 Spinal stenosis, lumbar region without neurogenic claudication: Secondary | ICD-10-CM | POA: Diagnosis not present

## 2019-09-01 DIAGNOSIS — M47816 Spondylosis without myelopathy or radiculopathy, lumbar region: Secondary | ICD-10-CM | POA: Diagnosis not present

## 2019-09-01 DIAGNOSIS — M6281 Muscle weakness (generalized): Secondary | ICD-10-CM | POA: Diagnosis not present

## 2019-09-01 DIAGNOSIS — M48061 Spinal stenosis, lumbar region without neurogenic claudication: Secondary | ICD-10-CM | POA: Diagnosis not present

## 2019-09-06 DIAGNOSIS — M48061 Spinal stenosis, lumbar region without neurogenic claudication: Secondary | ICD-10-CM | POA: Diagnosis not present

## 2019-09-06 DIAGNOSIS — M47816 Spondylosis without myelopathy or radiculopathy, lumbar region: Secondary | ICD-10-CM | POA: Diagnosis not present

## 2019-09-06 DIAGNOSIS — M6281 Muscle weakness (generalized): Secondary | ICD-10-CM | POA: Diagnosis not present

## 2019-09-08 DIAGNOSIS — M6281 Muscle weakness (generalized): Secondary | ICD-10-CM | POA: Diagnosis not present

## 2019-09-08 DIAGNOSIS — M48061 Spinal stenosis, lumbar region without neurogenic claudication: Secondary | ICD-10-CM | POA: Diagnosis not present

## 2019-09-08 DIAGNOSIS — M47816 Spondylosis without myelopathy or radiculopathy, lumbar region: Secondary | ICD-10-CM | POA: Diagnosis not present

## 2019-09-13 DIAGNOSIS — M6281 Muscle weakness (generalized): Secondary | ICD-10-CM | POA: Diagnosis not present

## 2019-09-13 DIAGNOSIS — M47816 Spondylosis without myelopathy or radiculopathy, lumbar region: Secondary | ICD-10-CM | POA: Diagnosis not present

## 2019-09-13 DIAGNOSIS — M48061 Spinal stenosis, lumbar region without neurogenic claudication: Secondary | ICD-10-CM | POA: Diagnosis not present

## 2019-09-15 DIAGNOSIS — M6281 Muscle weakness (generalized): Secondary | ICD-10-CM | POA: Diagnosis not present

## 2019-09-15 DIAGNOSIS — M47816 Spondylosis without myelopathy or radiculopathy, lumbar region: Secondary | ICD-10-CM | POA: Diagnosis not present

## 2019-09-15 DIAGNOSIS — M48061 Spinal stenosis, lumbar region without neurogenic claudication: Secondary | ICD-10-CM | POA: Diagnosis not present

## 2019-09-19 DIAGNOSIS — M6281 Muscle weakness (generalized): Secondary | ICD-10-CM | POA: Diagnosis not present

## 2019-09-19 DIAGNOSIS — M48061 Spinal stenosis, lumbar region without neurogenic claudication: Secondary | ICD-10-CM | POA: Diagnosis not present

## 2019-09-19 DIAGNOSIS — M47816 Spondylosis without myelopathy or radiculopathy, lumbar region: Secondary | ICD-10-CM | POA: Diagnosis not present

## 2019-09-21 DIAGNOSIS — M5416 Radiculopathy, lumbar region: Secondary | ICD-10-CM | POA: Diagnosis not present

## 2019-09-21 DIAGNOSIS — M5136 Other intervertebral disc degeneration, lumbar region: Secondary | ICD-10-CM | POA: Diagnosis not present

## 2019-09-21 DIAGNOSIS — M48061 Spinal stenosis, lumbar region without neurogenic claudication: Secondary | ICD-10-CM | POA: Diagnosis not present

## 2019-09-28 DIAGNOSIS — M47816 Spondylosis without myelopathy or radiculopathy, lumbar region: Secondary | ICD-10-CM | POA: Diagnosis not present

## 2019-09-28 DIAGNOSIS — M48061 Spinal stenosis, lumbar region without neurogenic claudication: Secondary | ICD-10-CM | POA: Diagnosis not present

## 2019-09-28 DIAGNOSIS — M6281 Muscle weakness (generalized): Secondary | ICD-10-CM | POA: Diagnosis not present

## 2019-10-07 ENCOUNTER — Ambulatory Visit: Payer: Self-pay

## 2019-10-07 DIAGNOSIS — M5416 Radiculopathy, lumbar region: Secondary | ICD-10-CM | POA: Diagnosis not present

## 2019-10-07 DIAGNOSIS — M48061 Spinal stenosis, lumbar region without neurogenic claudication: Secondary | ICD-10-CM | POA: Diagnosis not present

## 2019-10-07 DIAGNOSIS — M5136 Other intervertebral disc degeneration, lumbar region: Secondary | ICD-10-CM | POA: Diagnosis not present

## 2019-10-07 NOTE — Telephone Encounter (Signed)
Pt. Reports 2 days ago she checked her BP and it was elevated - 180/80. Saw a NP today and it was 188/88. No symptoms. No missed doses of medications. Declines appointment. Wants to know if Dr. Carlynn Purl wants to change her BP medication. Also wants Dr. Carlynn Purl to know she has back pain and has been seeing orthopedics for this. Has received two "cortisone shots." Please advise pt.  Answer Assessment - Initial Assessment Questions 1. BLOOD PRESSURE: "What is the blood pressure?" "Did you take at least two measurements 5 minutes apart?"     188/88 2. ONSET: "When did you take your blood pressure?"     This morning 3. HOW: "How did you obtain the blood pressure?" (e.g., visiting nurse, automatic home BP monitor)     NP 4. HISTORY: "Do you have a history of high blood pressure?"     Yes 5. MEDICATIONS: "Are you taking any medications for blood pressure?" "Have you missed any doses recently?"     No missed doses 6. OTHER SYMPTOMS: "Do you have any symptoms?" (e.g., headache, chest pain, blurred vision, difficulty breathing, weakness)      No 7. PREGNANCY: "Is there any chance you are pregnant?" "When was your last menstrual period?"     No  Protocols used: BLOOD PRESSURE - HIGH-A-AH

## 2019-10-11 NOTE — Telephone Encounter (Signed)
appt scheduled for this coming Friday (your first availability)

## 2019-10-12 NOTE — Progress Notes (Deleted)
Name: Kari Lee   MRN: [id number]    DOB: [birth year]   Date:10/12/2019       Progress Note  Subjective  Chief Complaint  Follow up/Blood Pressure   HPI HTN she is taking medication and denies side effects. BP is at goal today , losartan is working well for her, she denies chest pain or palpitation .   Bradycardia: she states her heart rate is always slow and seen by cardiologist and negative evaluation.Unchanged   GERD/: she is off PPI and bowel movements are back to normal, she denies heartburn or indigestion.    Obesity:she joined [employer] 05/26/2017, she started at 200.2 lbs andshewas down to 173 lbsbut since COVID-19 is up to 193 lbs She is back on [employer] on her own, counting points and weight has been going down again, trying to lose weight.   Hot Flashes: shesaw Dr. Chauncey Cruel she was noticingdryness in her face and also hot flashes, She tried to stop but it caused hot flashes   Elevated liver enzymes: over 100's , seen by Dr. Lars Pinks, US showed fatty liver, changed diet and lost weight, liver enzymes normalized.Last labs was normal 06/2018 , we will recheck it today   OA: most symptoms now on her hands, but has a history of knee replacement and left hip, has DIP deformities and also inability to make a tight fist, also noticing some pain on wristShe is taking naproxen twice daily, discussed risk of nsaids use , she also takes Tylenol. She has decrease in function because unable to grasp like . She has noticed pain on outer hips for weeks, she has noticed difficulty going up stairs and getting out of chairs. She states last week she was getting up from her toilet and felt a pulling sensation on right outer hip. No fever, chills or pain on groin. She states sudden change. Discussed polymyalgia rheumatica and discussed getting some inflammatory markers  Hyperlipidemia: she is on atorvastatin and is doing well at this time,she denies myalgia.Last level  checked in June and LDL was excellent but triglycerides are little high, we will recheck labs today   Major Depression: she has a long history of depression in the past had a lot of lack of motivation and felt hopeless, but currently not having those problems.She states she has always been a [place], but she states she moved to [place] and moved back and it was hard moving back to a bigger house and more maintenance.    Diet: she has been eating healthy again  Exercise: she states she has not been as active lately, hips are sore   USPSTF grade A and B recommendations *** Patient Active Problem List   Diagnosis Date Noted   Mild major depression (HCC) 11/19/2016   Traumatic closed nondisplaced fracture of one rib of right side 08/22/2016   Osteoarthritis of both hands 08/19/2016   Fatty liver disease, nonalcoholic 05/30/2016   Bradycardia 05/20/2016   Current long-term use of postmenopausal hormone replacement therapy 05/20/2016   GERD (gastroesophageal reflux disease) 05/20/2016   Hyperlipidemia, unspecified 05/20/2016   Hypertension 05/20/2016   Insomnia 05/20/2016   Arthritis 05/20/2016   Anxiety 05/20/2016   Rosacea 05/20/2016   History of hip replacement, total, 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    Past Surgical History:  Procedure Laterality Date   CERVICAL CONE BIOPSY     COLONOSCOPY  10/2010   COLONOSCOPY  2004   endoscopic carpal tunn Bilateral    HALLUX VALGUS CORRECTION Right 2005  HYSTERECTOMY ABDOMINAL WITH SALPINGECTOMY  1981   INCISION TENDON SHEATH HAND Right    JOINT REPLACEMENT Right 11/21/2010   knee   JOINT REPLACEMENT Left 11/04/2011   knee   JOINT REPLACEMENT Left 04/27/2012   hip    KNEE ARTHROSCOPY Right    KNEE ARTHROSCOPY Left 12/18/2011    Family History  Problem Relation Age of Onset   Alzheimer's disease Mother 75   Osteoporosis Mother    Stroke Father 88   Arthritis Sister    Basal cell carcinoma Sister     Macular degeneration Other    Breast cancer Maternal Aunt 50    Social History   Tobacco Use   Smoking status: Never Smoker   Smokeless tobacco: Never Used  Substance Use Topics   Alcohol use: Yes    Alcohol/week: 1.0 standard drink    Types: 1 Glasses of wine per week     Current Outpatient Medications:    acetaminophen (TYLENOL) 500 MG tablet, Take 1 tablet (500 mg total) by mouth 2 (two) times daily., Disp: 60 tablet, Rfl: 0   Ascorbic Acid (VITAMIN C) 1000 MG tablet, Take 1,000 mg by mouth daily., Disp: , Rfl:    atorvastatin (LIPITOR) 40 MG tablet, Take 1 tablet (40 mg total) by mouth daily at 6 PM., Disp: 90 tablet, Rfl: 1   estradiol (ESTRACE) 0.5 MG tablet, TAKE 1 TABLET BY MOUTH EVERY DAY, Disp: 90 tablet, Rfl: 1   losartan (COZAAR) 50 MG tablet, Take 1 tablet (50 mg total) by mouth daily., Disp: 90 tablet, Rfl: 1   Multiple Vitamin (MULTIVITAMIN) tablet, Take 1 tablet by mouth daily., Disp: , Rfl:    naproxen (NAPROSYN) 250 MG tablet, Take 250 mg by mouth as needed., Disp: , Rfl:    Vitamin D, Cholecalciferol, 50 MCG (2000 UT) CAPS, Take 2,000 Units by mouth daily., Disp: , Rfl:   Allergies  Allergen Reactions   Sulfa Antibiotics Nausea Only   Penicillins Rash    I personally reviewed {Reviewed:14835} with the patient/caregiver today.   ROS  ***  Objective  There were no vitals filed for this visit.  There is no height or weight on file to calculate BMI.  Physical Exam ***  No results found for this or any previous visit (from the past 2160 hour(s)).  Diabetic Foot Exam: Diabetic Foot Exam - Simple   No data filed     ***  PHQ2/9: Depression screen [facility] 2/9 06/21/2019 05/17/2019 10/29/2018 06/28/2018 02/19/2018  Decreased Interest 0 0 0 0 0  Down, Depressed, Hopeless 0 0 0 0 0  PHQ - 2 Score 0 0 0 0 0  Altered sleeping 0 - 0 0 1  Tired, decreased energy 0 - 0 0 0  Change in appetite 0 - 1 0 0  Feeling bad or failure about yourself  0 -  0 0 0  Trouble concentrating 0 - 0 0 1  Moving slowly or fidgety/restless 0 - 0 0 0  Suicidal thoughts 0 - 0 0 0  PHQ-9 Score 0 - 1 0 2  Difficult doing work/chores Not difficult at all - Not difficult at all Not difficult at all Not difficult at all  Some recent data might be hidden    phq 9 is {gen pos GLO:[id number]} ***  Fall Risk: Fall Risk  06/21/2019 05/17/2019 10/29/2018 06/28/2018 02/19/2018  Falls in the past year? 0 1 0 0 1  Number falls in past yr: 0 0 0 0 1  Comment - - - -  tripped in yard, no injury  Injury with Fall? 0 0 0 0 0  Comment - - - - -  Risk for fall due to : - No Fall Risks - - -  Follow up - Falls prevention discussed - - Falls prevention discussed   ***   Functional Status Survey:   ***   Assessment & Plan  *** There are no diagnoses linked to this encounter.

## 2019-10-14 ENCOUNTER — Ambulatory Visit: Payer: Medicare HMO | Admitting: Family Medicine

## 2019-10-17 ENCOUNTER — Ambulatory Visit: Payer: Medicare HMO | Admitting: Family Medicine

## 2019-11-03 DIAGNOSIS — S0501XA Injury of conjunctiva and corneal abrasion without foreign body, right eye, initial encounter: Secondary | ICD-10-CM | POA: Diagnosis not present

## 2019-11-13 ENCOUNTER — Other Ambulatory Visit: Payer: Self-pay | Admitting: Family Medicine

## 2019-11-13 NOTE — Telephone Encounter (Signed)
Requested Prescriptions  Pending Prescriptions Disp Refills  . estradiol (ESTRACE) 0.5 MG tablet [Pharmacy Med Name: ESTRADIOL 0.5 MG TABLET] 90 tablet 1    Sig: TAKE 1 TABLET BY MOUTH EVERY DAY     OB/GYN:  Estrogens Passed - 11/13/2019 10:40 AM      Passed - Mammogram is up-to-date per Health Maintenance      Passed - Last BP in normal range    BP Readings from Last 1 Encounters:  06/21/19 118/80         Passed - Valid encounter within last 12 months    Recent Outpatient Visits          4 months ago Essential hypertension   [facility] Alba Cory, MD   1 year ago Essential hypertension   [facility] Alba Cory, MD   1 year ago Major depression in remission [facility])   [facility] Alba Cory, MD   1 year ago Essential hypertension   [facility] Alba Cory, MD   2 years ago Essential hypertension   [facility] Alba Cory, MD      Future Appointments            In 1 month Carlynn Purl, Danna Hefty, MD [facility]   In 6 months  [facility]

## 2019-12-21 ENCOUNTER — Other Ambulatory Visit: Payer: Self-pay | Admitting: Family Medicine

## 2019-12-21 DIAGNOSIS — E782 Mixed hyperlipidemia: Secondary | ICD-10-CM

## 2019-12-21 NOTE — Progress Notes (Signed)
Name: Kari Lee   MRN: [id number]    DOB: [birth year]   Date:12/26/2019       Progress Note  Subjective  Chief Complaint  Follow up   HPI   HTN she is taking medication and denies side effects. She is taking  Losartan, she denies chest pain or palpitation .  Bradycardia: she states her heart rate is always slow and seen by cardiologist and negative evaluation. She denies any palpitation or chest pain    Obesity:she joined [employer] 05/26/2017, she started at 200.2 lbs andshewas down to 173 lbsbut since COVID-19 it went up to 193 lbs, she has not been back to [employer] due to being busy with PT for her spine, but weight has been stable since last year   Hot Flashes: shesaw Dr. Chauncey Cruel she was noticingdryness in her face and also hot flashes, She tried to stop but it caused hot flashes   Elevated liver enzymes: over 100's , seen by Dr. Lars Pinks, US showed fatty liver, changed diet and lost weight, liver enzymes normalized.Last labs was normal 05/2019 LDL 72  OA: most symptoms now on her hands, but has a history of knee replacement and left hip, has DIP deformities and also inability to make a tight fist, also noticing some pain on wristShe is taking naproxen twice daily, discussed risk of nsaids use , she also takes Tylenol. She has decrease in function because unable to grasp like . Unchanged  Laceration hands: from cooking, due for Tdap, not deep enough for stiches.   Hyperlipidemia: she is on atorvastatin and is doing well at this time,she denies myalgia.Last level was at goal, triglycerides back to normal, LDL was   Major Depression: she has a long history of depression in the past had a lot of lack of motivation and felt hopeless, but currently not having those problems.She states she has always been a [place], but she states she moved to [place] and moved back and it was hard moving back to a bigger house and more maintenance.   Elevated HCT: she  snores at night, she states wakes up feeling rested, she does not want to be checked for sleep apnea.   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 Rosita Kea and referred to Dr. Yves Dill, and had PT and two steroid injection, she continues to have pain that is described as burning  on lateral thighs but states symptoms are not constant, usually triggered by getting up from sitting position and when going up and down stairs. She denies back pain   IMPRESSION: Transitional lumbosacral anatomy. Please see numbering scheme above and correlate with plain films if any intervention is planned.  Disc bulge to the left at L2-3 causes narrowing in the left subarticular recess which could impact the left L3 root. There is also mild left foraminal narrowing at this level.  Disc and endplate spur to the right at L3-4 cause narrowing in the right subarticular recess and could impact the descending right L4 root. Moderate to moderately severe foraminal narrowing at L3-4 is worse on the left.  Patient Active Problem List   Diagnosis Date Noted   Major depression in remission (HCC) 12/26/2019   Vitamin D deficiency 12/26/2019   DDD (degenerative disc disease), lumbar 12/26/2019   Lumbar stenosis 12/26/2019   Osteoarthritis 08/19/2016   Fatty liver disease, nonalcoholic 05/30/2016   Bradycardia 05/20/2016   Current long-term use of postmenopausal hormone replacement therapy 05/20/2016   Hyperlipidemia, unspecified  05/20/2016   Hypertension 05/20/2016   Rosacea 05/20/2016   History of hip replacement, total, 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    Past Surgical History:  Procedure Laterality Date   CERVICAL CONE BIOPSY     COLONOSCOPY  10/2010   COLONOSCOPY  2004   endoscopic carpal tunn Bilateral    HALLUX VALGUS CORRECTION Right 2005   HYSTERECTOMY ABDOMINAL WITH SALPINGECTOMY  1981   INCISION TENDON SHEATH HAND Right    JOINT REPLACEMENT Right 11/21/2010    knee   JOINT REPLACEMENT Left 11/04/2011   knee   JOINT REPLACEMENT Left 04/27/2012   hip    KNEE ARTHROSCOPY Right    KNEE ARTHROSCOPY Left 12/18/2011    Family History  Problem Relation Age of Onset   Alzheimer's disease Mother 82   Osteoporosis Mother    Stroke Father 27   Arthritis Sister    Basal cell carcinoma Sister    Macular degeneration Other    Breast cancer Maternal Aunt 22    Social History   Tobacco Use   Smoking status: Never Smoker   Smokeless tobacco: Never Used  Substance Use Topics   Alcohol use: Yes    Alcohol/week: 1.0 standard drink    Types: 1 Glasses of wine per week     Current Outpatient Medications:    acetaminophen (TYLENOL) 500 MG tablet, Take 1 tablet (500 mg total) by mouth 2 (two) times daily., Disp: 60 tablet, Rfl: 0   Ascorbic Acid (VITAMIN C) 1000 MG tablet, Take 1,000 mg by mouth daily., Disp: , Rfl:    atorvastatin (LIPITOR) 40 MG tablet, TAKE 1 TABLET BY MOUTH EVERY DAY AT 6PM, Disp: 90 tablet, Rfl: 1   estradiol (ESTRACE) 0.5 MG tablet, TAKE 1 TABLET BY MOUTH EVERY DAY, Disp: 90 tablet, Rfl: 1   losartan (COZAAR) 50 MG tablet, Take 1 tablet (50 mg total) by mouth daily., Disp: 90 tablet, Rfl: 1   Multiple Vitamin (MULTIVITAMIN) tablet, Take 1 tablet by mouth daily., Disp: , Rfl:    naproxen sodium (ALEVE) 220 MG tablet, Take 440 mg by mouth in the morning and at bedtime., Disp: , Rfl:    Vitamin D, Cholecalciferol, 50 MCG (2000 UT) CAPS, Take 2,000 Units by mouth daily., Disp: , Rfl:   Allergies  Allergen Reactions   Sulfa Antibiotics Nausea Only   Penicillins Rash    I personally reviewed active problem list, medication list, allergies, family history, social history, health maintenance, notes from last encounter with the patient/caregiver today.   ROS  Constitutional: Negative for fever or weight change.  Respiratory: Negative for cough and shortness of breath.   Cardiovascular: Negative for chest  pain or palpitations.  Gastrointestinal: Negative for abdominal pain, no bowel changes.  Musculoskeletal: Negative for gait problem or joint swelling.  Skin: Negative for rash.  Neurological: Negative for dizziness or headache.  No other specific complaints in a complete review of systems (except as listed in HPI above).  Objective  Vitals:   12/26/19 0844 12/26/19 0926  BP: 132/84   Pulse: 78 (!) 53  Resp: 16   Temp: 98 F (36.7 C)   TempSrc: Oral   SpO2: 98%   Weight: 190 lb 1.6 oz (86.2 kg)   Height: 5\' 4"  (1.626 m)     Body mass index is 32.63 kg/m.  Physical Exam  Constitutional: Patient appears well-developed and well-nourished. Obese  No distress.  HEENT: head atraumatic, normocephalic, pupils equal and reactive to light,  neck supple Cardiovascular: Normal rate, regular rhythm  and normal heart sounds.  No murmur heard. No BLE edema. Pulmonary/Chest: Effort normal and breath sounds normal. No respiratory distress. Abdominal: Soft.  There is no tenderness. Psychiatric: Patient has a normal mood and affect. behavior is normal. Judgment and thought content normal.  PHQ2/9: Depression screen [facility] 2/9 12/26/2019 06/21/2019 05/17/2019 10/29/2018 06/28/2018  Decreased Interest 0 0 0 0 0  Down, Depressed, Hopeless 0 0 0 0 0  PHQ - 2 Score 0 0 0 0 0  Altered sleeping 0 0 - 0 0  Tired, decreased energy 1 0 - 0 0  Change in appetite 1 0 - 1 0  Feeling bad or failure about yourself  0 0 - 0 0  Trouble concentrating 0 0 - 0 0  Moving slowly or fidgety/restless 0 0 - 0 0  Suicidal thoughts 0 0 - 0 0  PHQ-9 Score 2 0 - 1 0  Difficult doing work/chores Not difficult at all Not difficult at all - Not difficult at all Not difficult at all  Some recent data might be hidden    phq 9 is negative   Fall Risk: Fall Risk  12/26/2019 06/21/2019 05/17/2019 10/29/2018 06/28/2018  Falls in the past year? 1 0 1 0 0  Number falls in past yr: 1 0 0 0 0  Comment - - - - -  Injury with Fall? 0 0  0 0 0  Comment - - - - -  Risk for fall due to : - - No Fall Risks - -  Follow up - - Falls prevention discussed - -     Functional Status Survey: Is the patient deaf or have difficulty hearing?: Yes Does the patient have difficulty seeing, even when wearing glasses/contacts?: No Does the patient have difficulty concentrating, remembering, or making decisions?: No Does the patient have difficulty walking or climbing stairs?: No Does the patient have difficulty dressing or bathing?: No Does the patient have difficulty doing errands alone such as visiting a doctor's office or shopping?: No    Assessment & Plan  1. Essential hypertension   2. Fatty liver   3. Need for immunization against influenza  - Flu Vaccine QUAD 36+ mos IM  4. Primary osteoarthritis involving multiple joints   5. Mixed hyperlipidemia   6. Major depression in remission (HCC)  Off medications and doing well  7. Vitamin D deficiency   8. Essential (primary) hypertension  - losartan (COZAAR) 50 MG tablet; Take 1 tablet (50 mg total) by mouth daily.  Dispense: 90 tablet; Refill: 1  9. Abnormal CBC  - CBC with Differential/Platelet  10. Need for Tdap vaccination  - Tdap vaccine greater than or equal to 7yo IM  11. Laceration of right hand without foreign body, initial encounter  - Tdap vaccine greater than or equal to 7yo IM  12. DDD (degenerative disc disease), lumbar  Stable   13. Spinal stenosis of lumbar region without neurogenic claudication

## 2019-12-26 ENCOUNTER — Other Ambulatory Visit: Payer: Self-pay

## 2019-12-26 ENCOUNTER — Ambulatory Visit (INDEPENDENT_AMBULATORY_CARE_PROVIDER_SITE_OTHER): Payer: Medicare HMO | Admitting: Family Medicine

## 2019-12-26 ENCOUNTER — Encounter: Payer: Self-pay | Admitting: Family Medicine

## 2019-12-26 VITALS — BP 132/84 | HR 53 | Temp 98.0°F | Resp 16 | Ht 64.0 in | Wt 190.1 lb

## 2019-12-26 DIAGNOSIS — I10 Essential (primary) hypertension: Secondary | ICD-10-CM

## 2019-12-26 DIAGNOSIS — M159 Polyosteoarthritis, unspecified: Secondary | ICD-10-CM

## 2019-12-26 DIAGNOSIS — M8949 Other hypertrophic osteoarthropathy, multiple sites: Secondary | ICD-10-CM | POA: Diagnosis not present

## 2019-12-26 DIAGNOSIS — Z23 Encounter for immunization: Secondary | ICD-10-CM

## 2019-12-26 DIAGNOSIS — S61411A Laceration without foreign body of right hand, initial encounter: Secondary | ICD-10-CM

## 2019-12-26 DIAGNOSIS — M5136 Other intervertebral disc degeneration, lumbar region: Secondary | ICD-10-CM | POA: Diagnosis not present

## 2019-12-26 DIAGNOSIS — R7989 Other specified abnormal findings of blood chemistry: Secondary | ICD-10-CM

## 2019-12-26 DIAGNOSIS — F325 Major depressive disorder, single episode, in full remission: Secondary | ICD-10-CM | POA: Insufficient documentation

## 2019-12-26 DIAGNOSIS — K760 Fatty (change of) liver, not elsewhere classified: Secondary | ICD-10-CM | POA: Diagnosis not present

## 2019-12-26 DIAGNOSIS — M51369 Other intervertebral disc degeneration, lumbar region without mention of lumbar back pain or lower extremity pain: Secondary | ICD-10-CM | POA: Insufficient documentation

## 2019-12-26 DIAGNOSIS — E782 Mixed hyperlipidemia: Secondary | ICD-10-CM

## 2019-12-26 DIAGNOSIS — R69 Illness, unspecified: Secondary | ICD-10-CM | POA: Diagnosis not present

## 2019-12-26 DIAGNOSIS — E559 Vitamin D deficiency, unspecified: Secondary | ICD-10-CM | POA: Insufficient documentation

## 2019-12-26 DIAGNOSIS — M48061 Spinal stenosis, lumbar region without neurogenic claudication: Secondary | ICD-10-CM | POA: Insufficient documentation

## 2019-12-26 LAB — CBC WITH DIFFERENTIAL/PLATELET
Absolute Monocytes: 598 cells/uL (ref 200–950)
Basophils Absolute: 33 cells/uL (ref 0–200)
Basophils Relative: 0.5 %
Eosinophils Absolute: 260 cells/uL (ref 15–500)
Eosinophils Relative: 4 %
HCT: 44.1 % (ref 35.0–45.0)
Hemoglobin: 15 g/dL (ref 11.7–15.5)
Lymphs Abs: 1950 cells/uL (ref 850–3900)
MCH: 31 pg (ref 27.0–33.0)
MCHC: 34 g/dL (ref 32.0–36.0)
MCV: 91.1 fL (ref 80.0–100.0)
MPV: 11.1 fL (ref 7.5–12.5)
Monocytes Relative: 9.2 %
Neutro Abs: 3660 cells/uL (ref 1500–7800)
Neutrophils Relative %: 56.3 %
Platelets: 178 10*3/uL (ref 140–400)
RBC: 4.84 10*6/uL (ref 3.80–5.10)
RDW: 12.2 % (ref 11.0–15.0)
Total Lymphocyte: 30 %
WBC: 6.5 10*3/uL (ref 3.8–10.8)

## 2019-12-26 MED ORDER — LOSARTAN POTASSIUM 50 MG PO TABS: 50.0000 mg | ORAL_TABLET | Freq: Every day | ORAL | 1 refills | Status: DC

## 2019-12-27 DIAGNOSIS — R69 Illness, unspecified: Secondary | ICD-10-CM | POA: Diagnosis not present

## 2020-02-22 DIAGNOSIS — H35371 Puckering of macula, right eye: Secondary | ICD-10-CM | POA: Diagnosis not present

## 2020-02-22 DIAGNOSIS — H5213 Myopia, bilateral: Secondary | ICD-10-CM | POA: Diagnosis not present

## 2020-05-08 ENCOUNTER — Other Ambulatory Visit: Payer: Self-pay | Admitting: Family Medicine

## 2020-05-17 ENCOUNTER — Ambulatory Visit: Payer: Medicare HMO

## 2020-06-12 ENCOUNTER — Ambulatory Visit (INDEPENDENT_AMBULATORY_CARE_PROVIDER_SITE_OTHER): Payer: Medicare HMO

## 2020-06-12 ENCOUNTER — Other Ambulatory Visit: Payer: Self-pay

## 2020-06-12 VITALS — BP 140/82 | HR 68 | Temp 98.3°F | Resp 15 | Ht 64.0 in | Wt 195.1 lb

## 2020-06-12 DIAGNOSIS — Z0000 Encounter for general adult medical examination without abnormal findings: Secondary | ICD-10-CM

## 2020-06-12 DIAGNOSIS — Z780 Asymptomatic menopausal state: Secondary | ICD-10-CM | POA: Diagnosis not present

## 2020-06-12 DIAGNOSIS — Z1231 Encounter for screening mammogram for malignant neoplasm of breast: Secondary | ICD-10-CM

## 2020-06-12 NOTE — Patient Instructions (Signed)
Kari Lee , Thank you for taking time to come for your Medicare Wellness Visit. I appreciate your ongoing commitment to your health goals. Please review the following plan we discussed and let me know if I can assist you in the future.   Screening recommendations/referrals: Colonoscopy: done 10/2010; due for repeat screening. Please discuss with Dr. Carlynn Purl at your next visit.  Mammogram: done 05/30/19. Please call [phone] to schedule your mammogram and bone density screening.  Bone Density: done 11/03/17 Recommended yearly ophthalmology/optometry visit for glaucoma screening and checkup Recommended yearly dental visit for hygiene and checkup  Vaccinations: Influenza vaccine: done 12/26/19 Pneumococcal vaccine: done 11/19/16 Tdap vaccine: done 12/26/19 Shingles vaccine: Shingrix discussed. Please contact your pharmacy for coverage information.  Covid-19: done 03/09/19 & 03/30/19  Advanced directives: Advance directive discussed with you today. I have provided a copy for you to complete at home and have notarized. Once this is complete please bring a copy in to our office so we can scan it into your chart.  Conditions/risks identified: Recommend drinking 6-8 glasses of water per day   Next appointment: Follow up in one year for your annual wellness visit    Preventive Care 65 Years and Older, Female Preventive care refers to lifestyle choices and visits with your health care provider that can promote health and wellness. What does preventive care include?  A yearly physical exam. This is also called an annual well check.  Dental exams once or twice a year.  Routine eye exams. Ask your health care provider how often you should have your eyes checked.  Personal lifestyle choices, including:  Daily care of your teeth and gums.  Regular physical activity.  Eating a healthy diet.  Avoiding tobacco and drug use.  Limiting alcohol use.  Practicing safe sex.  Taking low-dose  aspirin every day.  Taking vitamin and mineral supplements as recommended by your health care provider. What happens during an annual well check? The services and screenings done by your health care provider during your annual well check will depend on your age, overall health, lifestyle risk factors, and family history of disease. Counseling  Your health care provider may ask you questions about your:  Alcohol use.  Tobacco use.  Drug use.  Emotional well-being.  Home and relationship well-being.  Sexual activity.  Eating habits.  History of falls.  Memory and ability to understand (cognition).  Work and work Astronomer.  Reproductive health. Screening  You may have the following tests or measurements:  Height, weight, and BMI.  Blood pressure.  Lipid and cholesterol levels. These may be checked every 5 years, or more frequently if you are over 85 years old.  Skin check.  Lung cancer screening. You may have this screening every year starting at age 31 if you have a 30-pack-year history of smoking and currently smoke or have quit within the past 15 years.  Fecal occult blood test (FOBT) of the stool. You may have this test every year starting at age 79.  Flexible sigmoidoscopy or colonoscopy. You may have a sigmoidoscopy every 5 years or a colonoscopy every 10 years starting at age 106.  Hepatitis C blood test.  Hepatitis B blood test.  Sexually transmitted disease (STD) testing.  Diabetes screening. This is done by checking your blood sugar (glucose) after you have not eaten for a while (fasting). You may have this done every 1-3 years.  Bone density scan. This is done to screen for osteoporosis. You may have this done starting  at age 61.  Mammogram. This may be done every 1-2 years. Talk to your health care provider about how often you should have regular mammograms. Talk with your health care provider about your test results, treatment options, and if  necessary, the need for more tests. Vaccines  Your health care provider may recommend certain vaccines, such as:  Influenza vaccine. This is recommended every year.  Tetanus, diphtheria, and acellular pertussis (Tdap, Td) vaccine. You may need a Td booster every 10 years.  Zoster vaccine. You may need this after age 45.  Pneumococcal 13-valent conjugate (PCV13) vaccine. One dose is recommended after age 71.  Pneumococcal polysaccharide (PPSV23) vaccine. One dose is recommended after age 16. Talk to your health care provider about which screenings and vaccines you need and how often you need them. This information is not intended to replace advice given to you by your health care provider. Make sure you discuss any questions you have with your health care provider. Document Released: 02/09/2015 Document Revised: 10/03/2015 Document Reviewed: 11/14/2014 Elsevier Interactive Patient Education  2017 [employer].  Fall Prevention in the Home Falls can cause injuries. They can happen to people of all ages. There are many things you can do to make your home safe and to help prevent falls. What can I do on the outside of my home?  Regularly fix the edges of walkways and driveways and fix any cracks.  Remove anything that might make you trip as you walk through a door, such as a raised step or threshold.  Trim any bushes or trees on the path to your home.  Use bright outdoor lighting.  Clear any walking paths of anything that might make someone trip, such as rocks or tools.  Regularly check to see if handrails are loose or broken. Make sure that both sides of any steps have handrails.  Any raised decks and porches should have guardrails on the edges.  Have any leaves, snow, or ice cleared regularly.  Use sand or salt on walking paths during winter.  Clean up any spills in your garage right away. This includes oil or grease spills. What can I do in the bathroom?  Use night  lights.  Install grab bars by the toilet and in the tub and shower. Do not use towel bars as grab bars.  Use non-skid mats or decals in the tub or shower.  If you need to sit down in the shower, use a plastic, non-slip stool.  Keep the floor dry. Clean up any water that spills on the floor as soon as it happens.  Remove soap buildup in the tub or shower regularly.  Attach bath mats securely with double-sided non-slip rug tape.  Do not have throw rugs and other things on the floor that can make you trip. What can I do in the bedroom?  Use night lights.  Make sure that you have a light by your bed that is easy to reach.  Do not use any sheets or blankets that are too big for your bed. They should not hang down onto the floor.  Have a firm chair that has side arms. You can use this for support while you get dressed.  Do not have throw rugs and other things on the floor that can make you trip. What can I do in the kitchen?  Clean up any spills right away.  Avoid walking on wet floors.  Keep items that you use a lot in easy-to-reach places.  If  you need to reach something above you, use a strong step stool that has a grab bar.  Keep electrical cords out of the way.  Do not use floor polish or wax that makes floors slippery. If you must use wax, use non-skid floor wax.  Do not have throw rugs and other things on the floor that can make you trip. What can I do with my stairs?  Do not leave any items on the stairs.  Make sure that there are handrails on both sides of the stairs and use them. Fix handrails that are broken or loose. Make sure that handrails are as long as the stairways.  Check any carpeting to make sure that it is firmly attached to the stairs. Fix any carpet that is loose or worn.  Avoid having throw rugs at the top or bottom of the stairs. If you do have throw rugs, attach them to the floor with carpet tape.  Make sure that you have a light switch at the  top of the stairs and the bottom of the stairs. If you do not have them, ask someone to add them for you. What else can I do to help prevent falls?  Wear shoes that:  Do not have high heels.  Have rubber bottoms.  Are comfortable and fit you well.  Are closed at the toe. Do not wear sandals.  If you use a stepladder:  Make sure that it is fully opened. Do not climb a closed stepladder.  Make sure that both sides of the stepladder are locked into place.  Ask someone to hold it for you, if possible.  Clearly mark and make sure that you can see:  Any grab bars or handrails.  First and last steps.  Where the edge of each step is.  Use tools that help you move around (mobility aids) if they are needed. These include:  Canes.  Walkers.  Scooters.  Crutches.  Turn on the lights when you go into a dark area. Replace any light bulbs as soon as they burn out.  Set up your furniture so you have a clear path. Avoid moving your furniture around.  If any of your floors are uneven, fix them.  If there are any pets around you, be aware of where they are.  Review your medicines with your doctor. Some medicines can make you feel dizzy. This can increase your chance of falling. Ask your doctor what other things that you can do to help prevent falls. This information is not intended to replace advice given to you by your health care provider. Make sure you discuss any questions you have with your health care provider. Document Released: 11/09/2008 Document Revised: 06/21/2015 Document Reviewed: 02/17/2014 Elsevier Interactive Patient Education  2017 [employer].

## 2020-06-12 NOTE — Progress Notes (Addendum)
Subjective:   Kari Lee is a 75 y.o. female who presents for Medicare Annual (Subsequent) preventive examination.  Review of Systems     Cardiac Risk Factors include: advanced age (>69men, >85 women);dyslipidemia;hypertension;obesity (BMI >30kg/m2)     Objective:    Today's Vitals   06/12/20 0826  BP: 140/82  Pulse: 68  Resp: 15  Temp: 98.3 F (36.8 C)  TempSrc: Oral  SpO2: 95%  Weight: 195 lb 1.6 oz (88.5 kg)  Height: 5\' 4"  (1.626 m)   Body mass index is 33.49 kg/m.  Advanced Directives 06/12/2020 05/17/2019 02/19/2018 10/14/2017 06/25/2017 11/19/2016 09/23/2016  Does Patient Have a Medical Advance Directive? No Yes Yes Yes Yes Yes No  Type of Advance Directive - Healthcare Power of [place];Living will Healthcare Power of [place];Living will Healthcare Power of [place];Living will Living will Healthcare Power of Attorney -  Does patient want to make changes to medical advance directive? - Yes (MAU/Ambulatory/Procedural Areas - Information given) - - - - -  Copy of Healthcare Power of Attorney in Chart? - No - copy requested No - copy requested - - Yes -  Would patient like information on creating a medical advance directive? Yes (MAU/Ambulatory/Procedural Areas - Information given) - - - - - -    Current Medications (verified) Outpatient Encounter Medications as of 06/12/2020  Medication Sig  . acetaminophen (TYLENOL) 500 MG tablet Take 1 tablet (500 mg total) by mouth 2 (two) times daily.  . Ascorbic Acid (VITAMIN C) 1000 MG tablet Take 1,000 mg by mouth daily.  06/14/2020 atorvastatin (LIPITOR) 40 MG tablet TAKE 1 TABLET BY MOUTH EVERY DAY AT 6PM  . estradiol (ESTRACE) 0.5 MG tablet TAKE 1 TABLET BY MOUTH EVERY DAY  . losartan (COZAAR) 50 MG tablet Take 1 tablet (50 mg total) by mouth daily.  . Multiple Vitamin (MULTIVITAMIN) tablet Take 1 tablet by mouth daily.  . naproxen sodium (ALEVE) 220 MG tablet Take 440 mg by mouth in the morning and at bedtime.  . Vitamin D,  Cholecalciferol, 50 MCG (2000 UT) CAPS Take 2,000 Units by mouth daily.   No facility-administered encounter medications on file as of 06/12/2020.    Allergies (verified) Sulfa antibiotics and Penicillins   History: Past Medical History:  Diagnosis Date  . Anxiety   . Arthritis   . Bradycardia   . Bronchitis   . GERD (gastroesophageal reflux disease)   . History of positive PPD 2009  . History of positive PPD 2009   INH through health dept CXR's needed for clearance  . Hyperlipidemia   . Hypertension   . Insomnia   . Lumbar spondylosis    Managed by Dr. 2010 with [facility]  . Obesity   . Rosacea    Past Surgical History:  Procedure Laterality Date  . CERVICAL CONE BIOPSY    . COLONOSCOPY  10/2010  . COLONOSCOPY  2004  . endoscopic carpal tunn Bilateral   . HALLUX VALGUS CORRECTION Right 2005  . HYSTERECTOMY ABDOMINAL WITH SALPINGECTOMY  1981  . INCISION TENDON SHEATH HAND Right   . JOINT REPLACEMENT Right 11/21/2010   knee  . JOINT REPLACEMENT Left 11/04/2011   knee  . JOINT REPLACEMENT Left 04/27/2012   hip   . KNEE ARTHROSCOPY Right   . KNEE ARTHROSCOPY Left 12/18/2011   Family History  Problem Relation Age of Onset  . Alzheimer's disease Mother 6  . Osteoporosis Mother   . Stroke Father 58  . Arthritis Sister   .  Basal cell carcinoma Sister   . Macular degeneration Other   . Breast cancer Maternal Aunt 49   Social History   Socioeconomic History  . Marital status: Married    Spouse name: Maisie Fus  . Number of children: 2  . Years of education: Not on file  . Highest education level: Associate degree: occupational, Scientist, product/process development, or vocational program  Occupational History  . Occupation: Retired    Comment: Engineer, civil (consulting)  Tobacco Use  . Smoking status: Never Smoker  . Smokeless tobacco: Never Used  Vaping Use  . Vaping Use: Never used  Substance and Sexual Activity  . Alcohol use: Yes    Alcohol/week: 1.0 standard drink    Types: 1 Glasses of wine  per week  . Drug use: No  . Sexual activity: Yes    Partners: Male    Birth control/protection: None, Surgical, Post-menopausal  Other Topics Concern  . Not on file  Social History Narrative   She moved to [place] to downsize, but decided to move back to [place] after 2 years.    Married.   One daughter lives in [place].    The other daughter in [place].   Social Determinants of Health   Financial Resource Strain: Low Risk   . Difficulty of Paying Living Expenses: Not hard at all  Food Insecurity: No Food Insecurity  . Worried About Programme researcher, broadcasting/film/video in the Last Year: Never true  . Ran Out of Food in the Last Year: Never true  Transportation Needs: No Transportation Needs  . Lack of Transportation (Medical): No  . Lack of Transportation (Non-Medical): No  Physical Activity: Inactive  . Days of Exercise per Week: 0 days  . Minutes of Exercise per Session: 0 min  Stress: No Stress Concern Present  . Feeling of Stress : Not at all  Social Connections: Socially Integrated  . Frequency of Communication with Friends and Family: More than three times a week  . Frequency of Social Gatherings with Friends and Family: Twice a week  . Attends Religious Services: More than 4 times per year  . Active Member of Clubs or Organizations: Yes  . Attends Banker Meetings: More than 4 times per year  . Marital Status: Married    Tobacco Counseling Counseling given: Not Answered   Clinical Intake:  Pre-visit preparation completed: Yes  Pain : No/denies pain     BMI - recorded: 33.49 Nutritional Status: BMI > 30  Obese Nutritional Risks: None Diabetes: No  How often do you need to have someone help you when you read instructions, pamphlets, or other written materials from your doctor or pharmacy?: 1 - Never    Interpreter Needed?: No  Information entered by :: Reather Littler LPN   Activities of Daily Living In your present state of health, do you have any  difficulty performing the following activities: 06/12/2020 12/26/2019  Hearing? Malvin Johns  Comment declines hearing aids due to cost -  Vision? N N  Difficulty concentrating or making decisions? N N  Walking or climbing stairs? N N  Dressing or bathing? N N  Doing errands, shopping? N N  Preparing Food and eating ? N -  Using the Toilet? N -  In the past six months, have you accidently leaked urine? Y -  Comment wears pads for protection -  Do you have problems with loss of bowel control? N -  Managing your Medications? N -  Managing your Finances? N -  Housekeeping or managing  your Housekeeping? N -  Some recent data might be hidden    Patient Care Team: Alba Cory, MD as PCP - General (Family Medicine) Midge Minium, MD as Consulting Physician (Gastroenterology) Kennedy Bucker, MD as Consulting Physician (Orthopedic Surgery) Alwyn Pea, MD as Consulting Physician (Cardiology)  Indicate any recent Medical Services you may have received from other than [facility] providers in the past year (date may be approximate).     Assessment:   This is a routine wellness examination for Nikitha.  Hearing/Vision screen  Hearing Screening   125Hz  250Hz  500Hz  1000Hz  2000Hz  3000Hz  4000Hz  6000Hz  8000Hz   Right ear:           Left ear:           Comments: Pt has mild hearing difficulty and has been evaluated but has not gotten hearing aids due to cost.   Vision Screening Comments: [facility] screen[facility]  Dietary issues and exercise activities discussed: Current Exercise Habits: The patient does not participate in regular exercise at present, Exercise limited by: orthopedic condition(s)  Goals Addressed            This Visit's Progress   . DIET - INCREASE WATER INTAKE       Recommend drinking 6-8 glasses of water per day       Depression Screen PHQ 2/9 Scores 06/12/2020 12/26/2019 06/21/2019 05/17/2019 10/29/2018 06/28/2018 02/19/2018  PHQ - 2 Score 0 0 0  0 0 0 0  PHQ- 9 Score - 2 0 - 1 0 2    Fall Risk Fall Risk  06/12/2020 12/26/2019 06/21/2019 05/17/2019 10/29/2018  Falls in the past year? 0 1 0 1 0  Number falls in past yr: 0 1 0 0 0  Comment - - - - -  Injury with Fall? 0 0 0 0 0  Comment - - - - -  Risk for fall due to : No Fall Risks - - No Fall Risks -  Follow up Falls prevention discussed - - Falls prevention discussed -    FALL RISK PREVENTION PERTAINING TO THE HOME:  Any stairs in or around the home? Yes  If so, are there any without handrails? No  Home free of loose throw rugs in walkways, pet beds, electrical cords, etc? Yes  Adequate lighting in your home to reduce risk of falls? Yes   ASSISTIVE DEVICES UTILIZED TO PREVENT FALLS:  Life alert? No  Use of a cane, walker or w/c? No  Grab bars in the bathroom? Yes  Shower chair or bench in shower? No  Elevated toilet seat or a handicapped toilet? No   TIMED UP AND GO:  Was the test performed? Yes .  Length of time to ambulate 10 feet: 5 sec.   Gait steady and fast without use of assistive device  Cognitive Function: Normal cognitive status assessed by direct observation by this Nurse Health Advisor. No abnormalities found.       6CIT Screen 02/19/2018  What Year? 0 points  What month? 0 points  What time? 0 points  Count back from 20 0 points  Months in reverse 0 points  Repeat phrase 2 points  Total Score 2    Immunizations Immunization History  Administered Date(s) Administered  . Fluad Quad(high Dose 65+) 10/29/2018  . Influenza, High Dose Seasonal PF 11/19/2016, 10/20/2017  . Influenza,inj,Quad PF,6+ Mos 12/26/2019  . Influenza-Unspecified 01/05/2015  . PFIZER(Purple Top)SARS-COV-2 Vaccination 03/09/2019, 03/30/2019  . Pneumococcal Conjugate-13 07/03/2014  .  Pneumococcal Polysaccharide-23 11/03/2011, 11/19/2016  . Td 08/13/2009  . Tdap 12/26/2019  . Zoster 02/19/2012    TDAP status: Up to date  Flu Vaccine status: Up to date  Pneumococcal  vaccine status: Up to date  Covid-19 vaccine status: Completed vaccines  Qualifies for Shingles Vaccine? Yes   Zostavax completed Yes   Shingrix Completed?: No.    Education has been provided regarding the importance of this vaccine. Patient has been advised to call insurance company to determine out of pocket expense if they have not yet received this vaccine. Advised may also receive vaccine at local pharmacy or Health Dept. Verbalized acceptance and understanding.  Screening Tests Health Maintenance  Topic Date Due  . COVID-19 Vaccine (3 - Booster for Pfizer series) 08/30/2019  . MAMMOGRAM  05/29/2020  . INFLUENZA VACCINE  08/27/2020  . COLONOSCOPY (Pts [id number] Insurance coverage will need to be confirmed)  10/27/2020  . TETANUS/TDAP  12/25/2029  . DEXA SCAN  Completed  . Hepatitis C Screening  Completed  . PNA vac Low Risk Adult  Completed  . HPV VACCINES  Aged Out    Health Maintenance  Health Maintenance Due  Topic Date Due  . COVID-19 Vaccine (3 - Booster for Pfizer series) 08/30/2019  . MAMMOGRAM  05/29/2020    Colorectal cancer screening: Type of screening: Colonoscopy. Completed 2012. Repeat every 10 years. Pt declines repeat screening; would like to discuss with Dr. Carlynn PurlSowles.  Mammogram status: Completed 05/30/19. Repeat every year. Ordered today.  Bone Density status: Completed 11/03/17. Results reflect: Bone density results: NORMAL. Repeat every 2 years.  Lung Cancer Screening: (Low Dose CT Chest recommended if Age 45-80 years, 30 pack-year currently smoking OR have quit w/in 15years.) does not qualify.   Additional Screening:  Hepatitis C Screening: does qualify; Completed 05/30/16  Vision Screening: Recommended annual ophthalmology exams for early detection of glaucoma and other disorders of the eye. Is the patient up to date with their annual eye exam?  Yes  Who is the provider or what is the name of the office in which the patient attends annual eye exams? Dr.  Frazier [place].   Dental Screening: Recommended annual dental exams for proper oral hygiene  Community Resource Referral / Chronic Care Management: CRR required this visit?  No   CCM required this visit?  No      Plan:     I have personally reviewed and noted the following in the patient's chart:   . Medical and social history . Use of alcohol, tobacco or illicit drugs  . Current medications and supplements including opioid prescriptions.  . Functional ability and status . Nutritional status . Physical activity . Advanced directives . List of other physicians . Hospitalizations, surgeries, and ER visits in previous 12 months . Vitals . Screenings to include cognitive, depression, and falls . Referrals and appointments  In addition, I have reviewed and discussed with patient certain preventive protocols, quality metrics, and best practice recommendations. A written personalized care plan for preventive services as well as general preventive health recommendations were provided to patient.     Reather LittlerKasey Everardo Voris, LPN   4/09/81195/17/2022   Nurse Notes: pt states she has had increased episodes of swelling and arthritis pain primarily in hands and shoulders. She has previously taken celebrex but had discontinued due to cost and insurance coverage but interested in taking again. She currently takes naproxen 220 mg BID with PRN tylenol. Pt plans to discuss with Dr. Carlynn PurlSowles at next visit on 06/26/20.

## 2020-06-21 NOTE — Progress Notes (Signed)
Name: Kari Lee   MRN: [id number]    DOB: 1946-01-18   Date:06/26/2020       Progress Note  Subjective  Chief Complaint  Follow Up  HPI  HTN she is taking medication and denies side effects. She is taking  Losartan, she denies chest pain, palpitation of dizziness .   Bradycardia: she states her heart rate is always slow and seen by cardiologist and negative evaluation. She states usually in the 60's, today her vitals were taken as soon as she came in    Obesity:she joined [employer] 05/26/2017, she started at 200.2 lbs andshewas down to 173 lbsbut since COVID-19 it went up to 193 lbs, weight has been stable since, over the past year, advised her to try going down to around 180lbs but to focus on being healthy and not so much on a weight number   Hot Flashes: shesaw Dr. Chauncey Cruel she was noticingdryness in her face and also hot flashes, She tried to stop but it caused hot flashes , she asked me to refill her mediations today   Elevated liver enzymes: over 100's , seen by Dr. Lars Pinks, US showed fatty liver, changed diet and lost weight, liver enzymes normalized.Last labs was normal 05/2019 LDL 72. We will recheck labs today   OA: most symptoms now on her hands, but has a history of bilateral  knee replacement and left hip, has DIP deformities and also inability to make a tight fist, her wrists are not as bothersome now. She is taking naproxen twice daily, discussed risk of nsaids use , she e would like to try Celebrex again. 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 Rosita Kea and referred to Dr. Yves Dill, and had PT and two steroid injection, she states pain going down her legs have resolved  IMPRESSION: Transitional lumbosacral anatomy. Please see numbering scheme above and correlate with plain films if any intervention is planned.  Disc bulge to the left at L2-3 causes narrowing in the left subarticular recess which could impact the left L3 root. There is also mild left foraminal narrowing at this level.  Disc and endplate spur to the right at L3-4 cause narrowing in the right subarticular recess and could impact the descending right L4 root. Moderate to moderately severe foraminal narrowing at L3-4 is worse on the left.  Left shoulder pain: she states pain started about 10 days ago , not injury related, started gradually and got progressively worse but not as severe today , pain is on lateral shoulder and goes down to her arm, she states it can be 3-10/10, currently it is a 3. Worse at night and is affecting her sleep. She has noticed pain when abduction above 90 degrees. , she denies radiation to her neck   Patient Active Problem List   Diagnosis Date Noted  . Major depression in remission (HCC) 12/26/2019  . Vitamin D deficiency 12/26/2019  . DDD (degenerative disc disease), lumbar 12/26/2019  . Lumbar stenosis 12/26/2019  . Osteoarthritis 08/19/2016  .  Fatty liver disease, nonalcoholic 05/30/2016  . Bradycardia 05/20/2016  . Current long-term use of postmenopausal hormone replacement therapy 05/20/2016  . Hyperlipidemia, unspecified 05/20/2016  . Hypertension 05/20/2016  . Rosacea 05/20/2016  . History of hip replacement, total, 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    Past Surgical History:  Procedure Laterality Date  . CERVICAL CONE BIOPSY    . COLONOSCOPY  10/2010  . COLONOSCOPY  2004  . endoscopic carpal tunn Bilateral   . HALLUX VALGUS CORRECTION Right 2005  . HYSTERECTOMY ABDOMINAL WITH  SALPINGECTOMY  1981  . INCISION TENDON SHEATH HAND Right   . JOINT REPLACEMENT Right 11/21/2010   knee  . JOINT REPLACEMENT Left 11/04/2011   knee  . JOINT REPLACEMENT Left 04/27/2012   hip   . KNEE ARTHROSCOPY Right   . KNEE ARTHROSCOPY Left 12/18/2011    Family History  Problem Relation Age of Onset  . Alzheimer's disease Mother 50  . Osteoporosis Mother   . Stroke Father 37  . Arthritis Sister   . Basal cell carcinoma Sister   . Macular degeneration Other   . Breast cancer Maternal Aunt 55    Social History   Tobacco Use  . Smoking status: Never Smoker  . Smokeless tobacco: Never Used  Substance Use Topics  . Alcohol use: Yes    Alcohol/week: 1.0 standard drink    Types: 1 Glasses of wine per week     Current Outpatient Medications:  .  acetaminophen (TYLENOL) 500 MG tablet, Take 1 tablet (500 mg total) by mouth 2 (two) times daily., Disp: 60 tablet, Rfl: 0 .  Ascorbic Acid (VITAMIN C) 1000 MG tablet, Take 1,000 mg by mouth daily., Disp: , Rfl:  .  atorvastatin (LIPITOR) 40 MG tablet, TAKE 1 TABLET BY MOUTH EVERY DAY AT 6PM, Disp: 90 tablet, Rfl: 1 .  estradiol (ESTRACE) 0.5 MG tablet, TAKE 1 TABLET BY MOUTH EVERY DAY, Disp: 90 tablet, Rfl: 0 .  losartan (COZAAR) 50 MG tablet, Take 1 tablet (50 mg total) by mouth daily., Disp: 90 tablet, Rfl: 1 .  Multiple Vitamin (MULTIVITAMIN) tablet, Take 1 tablet by mouth daily., Disp: , Rfl:  .  naproxen sodium (ALEVE) 220 MG tablet, Take 440 mg by mouth in the morning and at bedtime., Disp: , Rfl:  .  Vitamin D, Cholecalciferol, 50 MCG (2000 UT) CAPS, Take 2,000 Units by mouth daily., Disp: , Rfl:   Allergies  Allergen Reactions  . Sulfa Antibiotics Nausea Only  . Penicillins Rash    I personally reviewed active problem list, medication list, allergies, family history, social history, health maintenance with the patient/caregiver today.   ROS  Constitutional: Negative for fever or weight change.  Respiratory: Negative  for cough and shortness of breath.   Cardiovascular: Negative for chest pain or palpitations.  Gastrointestinal: Negative for abdominal pain, no bowel changes.  Musculoskeletal: Negative for gait problem she has bilaterally hand deformities and pain from OA  Skin: Negative for rash.  Neurological: Negative for dizziness or headache.  No other specific complaints in a complete review of systems (except as listed in HPI above).  Objective  Vitals:   06/26/20 0840  BP: 136/86  Pulse: 83  Resp: 16  Temp: 97.9 F (36.6 C)  TempSrc: Oral  SpO2: 99%  Weight: 193 lb (87.5 kg)  Height: 5\' 4"  (1.626 m)    Body mass index is 33.13 kg/m.  Physical Exam  Constitutional: Patient appears well-developed and well-nourished. Obese  No distress.  HEENT: head atraumatic, normocephalic, pupils  equal and reactive to light,  neck supple  Cardiovascular: Normal rate, regular rhythm and normal heart sounds.  No murmur heard. No BLE edema. Pulmonary/Chest: Effort normal and breath sounds normal. No respiratory distress. Abdominal: Soft.  There is no tenderness. Muscular Skeletal: pain with abduction of left shoulder and also pain with internal rotation  Psychiatric: Patient has a normal mood and affect. behavior is normal. Judgment and thought content normal.  PHQ2/9: Depression screen [facility] 2/9 06/26/2020 06/12/2020 12/26/2019 06/21/2019 05/17/2019  Decreased Interest 0 0 0 0 0  Down, Depressed, Hopeless 0 0 0 0 0  PHQ - 2 Score 0 0 0 0 0  Altered sleeping 0 - 0 0 -  Tired, decreased energy 0 - 1 0 -  Change in appetite 0 - 1 0 -  Feeling bad or failure about yourself  0 - 0 0 -  Trouble concentrating 0 - 0 0 -  Moving slowly or fidgety/restless 0 - 0 0 -  Suicidal thoughts 0 - 0 0 -  PHQ-9 Score 0 - 2 0 -  Difficult doing work/chores - - Not difficult at all Not difficult at all -  Some recent data might be hidden    phq 9 is negative   Fall Risk: Fall Risk  06/26/2020 06/12/2020 12/26/2019  06/21/2019 05/17/2019  Falls in the past year? 0 0 1 0 1  Number falls in past yr: 0 0 1 0 0  Comment - - - - -  Injury with Fall? 0 0 0 0 0  Comment - - - - -  Risk for fall due to : - No Fall Risks - - No Fall Risks  Follow up - Falls prevention discussed - - Falls prevention discussed    Functional Status Survey: Is the patient deaf or have difficulty hearing?: Yes Does the patient have difficulty seeing, even when wearing glasses/contacts?: Yes Does the patient have difficulty concentrating, remembering, or making decisions?: No Does the patient have difficulty walking or climbing stairs?: No Does the patient have difficulty dressing or bathing?: No Does the patient have difficulty doing errands alone such as visiting a doctor's office or shopping?: No    Assessment & Plan  1. Essential hypertension  - COMPLETE METABOLIC PANEL WITH GFR - CBC with Differential/Platelet  2. Primary osteoarthritis involving multiple joints   3. Vitamin D deficiency   4. Fatty liver   5. DDD (degenerative disc disease), lumbar   6. Primary osteoarthritis of both hands  - celecoxib (CELEBREX) 200 MG capsule; Take 1 capsule (200 mg total) by mouth 2 (two) times daily.  Dispense: 90 capsule; Refill: 1  7. GERD without esophagitis   8. Spinal stenosis of lumbar region without neurogenic claudication   9. Acute pain of left shoulder  She has an appointment with Dr Rosita Kea tomorrow   10. Mixed hyperlipidemia  - atorvastatin (LIPITOR) 40 MG tablet; Take 1 tablet (40 mg total) by mouth daily.  Dispense: 90 tablet; Refill: 1 - Lipid panel  11. Essential (primary) hypertension  - losartan (COZAAR) 50 MG tablet; Take 1 tablet (50 mg total) by mouth daily.  Dispense: 90 tablet; Refill: 1  12. Major depression in remission (HCC)   13. Hot flashes  - estradiol (ESTRACE) 0.5 MG tablet; Take 1 tablet (0.5 mg total) by mouth daily.  Dispense: 90 tablet; Refill: 1

## 2020-06-26 ENCOUNTER — Ambulatory Visit (INDEPENDENT_AMBULATORY_CARE_PROVIDER_SITE_OTHER): Payer: Medicare HMO | Admitting: Family Medicine

## 2020-06-26 ENCOUNTER — Other Ambulatory Visit: Payer: Self-pay

## 2020-06-26 ENCOUNTER — Telehealth: Payer: Self-pay

## 2020-06-26 ENCOUNTER — Encounter: Payer: Self-pay | Admitting: Family Medicine

## 2020-06-26 VITALS — BP 136/86 | HR 83 | Temp 97.9°F | Resp 16 | Ht 64.0 in | Wt 193.0 lb

## 2020-06-26 DIAGNOSIS — M19042 Primary osteoarthritis, left hand: Secondary | ICD-10-CM

## 2020-06-26 DIAGNOSIS — M150 Primary generalized (osteo)arthritis: Secondary | ICD-10-CM

## 2020-06-26 DIAGNOSIS — M8949 Other hypertrophic osteoarthropathy, multiple sites: Secondary | ICD-10-CM | POA: Diagnosis not present

## 2020-06-26 DIAGNOSIS — E782 Mixed hyperlipidemia: Secondary | ICD-10-CM

## 2020-06-26 DIAGNOSIS — K760 Fatty (change of) liver, not elsewhere classified: Secondary | ICD-10-CM

## 2020-06-26 DIAGNOSIS — E559 Vitamin D deficiency, unspecified: Secondary | ICD-10-CM

## 2020-06-26 DIAGNOSIS — K219 Gastro-esophageal reflux disease without esophagitis: Secondary | ICD-10-CM

## 2020-06-26 DIAGNOSIS — I10 Essential (primary) hypertension: Secondary | ICD-10-CM | POA: Diagnosis not present

## 2020-06-26 DIAGNOSIS — M25512 Pain in left shoulder: Secondary | ICD-10-CM

## 2020-06-26 DIAGNOSIS — M5136 Other intervertebral disc degeneration, lumbar region: Secondary | ICD-10-CM | POA: Diagnosis not present

## 2020-06-26 DIAGNOSIS — M19041 Primary osteoarthritis, right hand: Secondary | ICD-10-CM

## 2020-06-26 DIAGNOSIS — M51369 Other intervertebral disc degeneration, lumbar region without mention of lumbar back pain or lower extremity pain: Secondary | ICD-10-CM

## 2020-06-26 DIAGNOSIS — M48061 Spinal stenosis, lumbar region without neurogenic claudication: Secondary | ICD-10-CM | POA: Diagnosis not present

## 2020-06-26 DIAGNOSIS — F325 Major depressive disorder, single episode, in full remission: Secondary | ICD-10-CM

## 2020-06-26 DIAGNOSIS — R232 Flushing: Secondary | ICD-10-CM

## 2020-06-26 DIAGNOSIS — M159 Polyosteoarthritis, unspecified: Secondary | ICD-10-CM

## 2020-06-26 MED ORDER — ESTRADIOL 0.5 MG PO TABS: 0.5000 mg | ORAL_TABLET | Freq: Every day | ORAL | 1 refills | Status: DC

## 2020-06-26 MED ORDER — ATORVASTATIN CALCIUM 40 MG PO TABS: 40.0000 mg | ORAL_TABLET | Freq: Every day | ORAL | 1 refills | Status: DC

## 2020-06-26 MED ORDER — CELECOXIB 200 MG PO CAPS: 200.0000 mg | ORAL_CAPSULE | Freq: Two times a day (BID) | ORAL | 1 refills | Status: DC

## 2020-06-26 MED ORDER — LOSARTAN POTASSIUM 50 MG PO TABS: 50.0000 mg | ORAL_TABLET | Freq: Every day | ORAL | 1 refills | Status: DC

## 2020-06-26 NOTE — Telephone Encounter (Signed)
errenous °

## 2020-06-26 NOTE — Patient Instructions (Signed)
Please check Shingrix coverage

## 2020-06-27 ENCOUNTER — Other Ambulatory Visit (HOSPITAL_COMMUNITY): Payer: Self-pay | Admitting: Orthopedic Surgery

## 2020-06-27 ENCOUNTER — Other Ambulatory Visit: Payer: Self-pay | Admitting: Orthopedic Surgery

## 2020-06-27 DIAGNOSIS — M542 Cervicalgia: Secondary | ICD-10-CM

## 2020-06-27 DIAGNOSIS — M75122 Complete rotator cuff tear or rupture of left shoulder, not specified as traumatic: Secondary | ICD-10-CM

## 2020-06-27 DIAGNOSIS — M25512 Pain in left shoulder: Secondary | ICD-10-CM | POA: Diagnosis not present

## 2020-06-27 DIAGNOSIS — M4802 Spinal stenosis, cervical region: Secondary | ICD-10-CM

## 2020-06-27 DIAGNOSIS — M9961 Osseous and subluxation stenosis of intervertebral foramina of cervical region: Secondary | ICD-10-CM | POA: Diagnosis not present

## 2020-06-27 LAB — CBC WITH DIFFERENTIAL/PLATELET
Absolute Monocytes: 517 cells/uL (ref 200–950)
Basophils Absolute: 28 cells/uL (ref 0–200)
Basophils Relative: 0.5 %
Eosinophils Absolute: 292 cells/uL (ref 15–500)
Eosinophils Relative: 5.3 %
HCT: 48 % — ABNORMAL HIGH (ref 35.0–45.0)
Hemoglobin: 15.5 g/dL (ref 11.7–15.5)
Lymphs Abs: 1804 cells/uL (ref 850–3900)
MCH: 30 pg (ref 27.0–33.0)
MCHC: 32.3 g/dL (ref 32.0–36.0)
MCV: 92.8 fL (ref 80.0–100.0)
MPV: 11 fL (ref 7.5–12.5)
Monocytes Relative: 9.4 %
Neutro Abs: 2860 cells/uL (ref 1500–7800)
Neutrophils Relative %: 52 %
Platelets: 176 10*3/uL (ref 140–400)
RBC: 5.17 10*6/uL — ABNORMAL HIGH (ref 3.80–5.10)
RDW: 12.6 % (ref 11.0–15.0)
Total Lymphocyte: 32.8 %
WBC: 5.5 10*3/uL (ref 3.8–10.8)

## 2020-06-27 LAB — COMPLETE METABOLIC PANEL WITH GFR
AG Ratio: 2.1 (calc) (ref 1.0–2.5)
ALT: 29 U/L (ref 6–29)
AST: 26 U/L (ref 10–35)
Albumin: 4.7 g/dL (ref 3.6–5.1)
Alkaline phosphatase (APISO): 61 U/L (ref 37–153)
BUN: 13 mg/dL (ref 7–25)
CO2: 31 mmol/L (ref 20–32)
Calcium: 10.2 mg/dL (ref 8.6–10.4)
Chloride: 103 mmol/L (ref 98–110)
Creat: 0.69 mg/dL (ref 0.60–0.93)
GFR, Est African American: 101 mL/min/{1.73_m2} (ref >=60)
GFR, Est Non African American: 87 mL/min/{1.73_m2} (ref >=60)
Globulin: 2.2 g/dL (calc) (ref 1.9–3.7)
Glucose, Bld: 99 mg/dL (ref 65–99)
Potassium: 5 mmol/L (ref 3.5–5.3)
Sodium: 141 mmol/L (ref 135–146)
Total Bilirubin: 0.8 mg/dL (ref 0.2–1.2)
Total Protein: 6.9 g/dL (ref 6.1–8.1)

## 2020-06-27 LAB — LIPID PANEL
Cholesterol: 153 mg/dL (ref <200)
HDL: 68 mg/dL (ref >=50)
LDL Cholesterol (Calc): 59 mg/dL (calc)
Non-HDL Cholesterol (Calc): 85 mg/dL (calc) (ref <130)
Total CHOL/HDL Ratio: 2.3 (calc) (ref <5.0)
Triglycerides: 179 mg/dL — ABNORMAL HIGH (ref <150)

## 2020-07-05 ENCOUNTER — Other Ambulatory Visit: Payer: Medicare HMO

## 2020-07-05 ENCOUNTER — Other Ambulatory Visit: Payer: Self-pay

## 2020-07-05 ENCOUNTER — Ambulatory Visit
Admission: RE | Admit: 2020-07-05 | Discharge: 2020-07-05 | Disposition: A | Discharge status: Home / Self Care | Payer: Medicare HMO | Source: Ambulatory Visit | Attending: Orthopedic Surgery | Admitting: Orthopedic Surgery

## 2020-07-05 DIAGNOSIS — R531 Weakness: Secondary | ICD-10-CM | POA: Diagnosis not present

## 2020-07-05 DIAGNOSIS — M542 Cervicalgia: Secondary | ICD-10-CM | POA: Insufficient documentation

## 2020-07-05 DIAGNOSIS — M75122 Complete rotator cuff tear or rupture of left shoulder, not specified as traumatic: Secondary | ICD-10-CM | POA: Diagnosis not present

## 2020-07-05 DIAGNOSIS — M5021 Other cervical disc displacement,  high cervical region: Secondary | ICD-10-CM | POA: Diagnosis not present

## 2020-07-05 DIAGNOSIS — G9589 Other specified diseases of spinal cord: Secondary | ICD-10-CM | POA: Diagnosis not present

## 2020-07-05 DIAGNOSIS — M4802 Spinal stenosis, cervical region: Secondary | ICD-10-CM | POA: Insufficient documentation

## 2020-07-05 DIAGNOSIS — M47812 Spondylosis without myelopathy or radiculopathy, cervical region: Secondary | ICD-10-CM | POA: Diagnosis not present

## 2020-07-05 DIAGNOSIS — M19012 Primary osteoarthritis, left shoulder: Secondary | ICD-10-CM | POA: Diagnosis not present

## 2020-07-09 ENCOUNTER — Other Ambulatory Visit: Payer: Medicare HMO

## 2020-07-10 DIAGNOSIS — G959 Disease of spinal cord, unspecified: Secondary | ICD-10-CM | POA: Diagnosis not present

## 2020-08-13 DIAGNOSIS — M75122 Complete rotator cuff tear or rupture of left shoulder, not specified as traumatic: Secondary | ICD-10-CM | POA: Diagnosis not present

## 2020-08-13 DIAGNOSIS — M7522 Bicipital tendinitis, left shoulder: Secondary | ICD-10-CM | POA: Diagnosis not present

## 2020-08-13 DIAGNOSIS — Z0100 Encounter for examination of eyes and vision without abnormal findings: Secondary | ICD-10-CM | POA: Diagnosis not present

## 2020-08-13 DIAGNOSIS — M7582 Other shoulder lesions, left shoulder: Secondary | ICD-10-CM | POA: Diagnosis not present

## 2020-08-17 ENCOUNTER — Other Ambulatory Visit: Payer: Self-pay | Admitting: Surgery

## 2020-09-11 ENCOUNTER — Other Ambulatory Visit: Payer: Self-pay

## 2020-09-11 ENCOUNTER — Encounter
Admission: RE | Admit: 2020-09-11 | Discharge: 2020-09-11 | Disposition: A | Discharge status: Home / Self Care | Payer: Medicare HMO | Source: Ambulatory Visit | Attending: Surgery | Admitting: Surgery

## 2020-09-11 ENCOUNTER — Encounter: Payer: Self-pay | Admitting: Urgent Care

## 2020-09-11 ENCOUNTER — Other Ambulatory Visit
Admission: RE | Admit: 2020-09-11 | Discharge: 2020-09-11 | Disposition: A | Discharge status: Home / Self Care | Payer: Medicare HMO | Source: Ambulatory Visit | Attending: Surgery | Admitting: Surgery

## 2020-09-11 DIAGNOSIS — Z01810 Encounter for preprocedural cardiovascular examination: Secondary | ICD-10-CM | POA: Diagnosis not present

## 2020-09-11 DIAGNOSIS — M75122 Complete rotator cuff tear or rupture of left shoulder, not specified as traumatic: Secondary | ICD-10-CM | POA: Diagnosis not present

## 2020-09-11 HISTORY — DX: Other complications of anesthesia, initial encounter: T88.59XA

## 2020-09-11 HISTORY — DX: Cardiac arrhythmia, unspecified: I49.9

## 2020-09-11 HISTORY — DX: Spondylosis without myelopathy or radiculopathy, cervical region: M47.812

## 2020-09-11 HISTORY — DX: Spinal stenosis, site unspecified: M48.00

## 2020-09-11 NOTE — Patient Instructions (Addendum)
Your procedure is scheduled on: 09/20/20 - Thursday Report to the Registration Desk on the 1st floor of the [facility]. To find out your arrival time, please call [phone] between 1PM - 3PM on: 09/19/20 - Wednesday  REMEMBER: Instructions that are not followed completely may result in serious medical risk, up to and including death; or upon the discretion of your surgeon and anesthesiologist your surgery may need to be rescheduled.  Do not eat food after midnight the night before surgery.  No gum chewing, lozengers or hard candies.  You may however, drink CLEAR liquids up to 2 hours before you are scheduled to arrive for your surgery. Do not drink anything within 2 hours of your scheduled arrival time.  Clear liquids include: - water  - apple juice without pulp - gatorade (not RED, PURPLE, OR BLUE) - black coffee or tea (Do NOT add milk or creamers to the coffee or tea) Do NOT drink anything that is not on this list.  Type 1 and Type 2 diabetics should only drink water.  In addition, your doctor has ordered for you to drink the provided  Ensure Pre-Surgery Clear Carbohydrate Drink  Drinking this carbohydrate drink up to two hours before surgery helps to reduce insulin resistance and improve patient outcomes. Please complete drinking 2 hours prior to scheduled arrival time.  TAKE THESE MEDICATIONS THE MORNING OF SURGERY WITH A SIP OF WATER:  - celecoxib (CELEBREX) 200 MG capsule - estradiol (ESTRACE) 0.5 MG tablet - acetaminophen (TYLENOL) 500 MG tablet- take as directed  One week prior to surgery: Stop Anti-inflammatories (NSAIDS) such as Advil, Aleve, Ibuprofen, Motrin, Naproxen, Naprosyn and Aspirin based products such as Excedrin, Goodys Powder, BC Powder.  Stop ANY OVER THE COUNTER supplements until after surgery.  You may however, continue to take Tylenol if needed for pain up until the day of surgery.  No Alcohol for 24 hours before or after surgery.  No Smoking  including e-cigarettes for 24 hours prior to surgery.  No chewable tobacco products for at least 6 hours prior to surgery.  No nicotine patches on the day of surgery.  Do not use any "recreational" drugs for at least a week prior to your surgery.  Please be advised that the combination of cocaine and anesthesia may have negative outcomes, up to and including death. If you test positive for cocaine, your surgery will be cancelled.  On the morning of surgery brush your teeth with toothpaste and water, you may rinse your mouth with mouthwash if you wish. Do not swallow any toothpaste or mouthwash.  Do not wear jewelry, make-up, hairpins, clips or nail polish.  Do not wear lotions, powders, or perfumes.   Do not shave body from the neck down 48 hours prior to surgery just in case you cut yourself which could leave a site for infection.  Also, freshly shaved skin may become irritated if using the CHG soap.  Contact lenses, hearing aids and dentures may not be worn into surgery.  Do not bring valuables to the hospital. [facility] is not responsible for any missing/lost belongings or valuables.   Use CHG Soap or wipes as directed on instruction sheet.  Notify your doctor if there is any change in your medical condition (cold, fever, infection).  Wear comfortable clothing (specific to your surgery type) to the hospital.  After surgery, you can help prevent lung complications by doing breathing exercises.  Take deep breaths and cough every 1-2 hours. Your doctor may order  a device called an Incentive Spirometer to help you take deep breaths. When coughing or sneezing, hold a pillow firmly against your incision with both hands. This is called "splinting." Doing this helps protect your incision. It also decreases belly discomfort.  If you are being admitted to the hospital overnight, leave your suitcase in the car. After surgery it may be brought to your room.  If you are being discharged  the day of surgery, you will not be allowed to drive home. You will need a responsible adult (18 years or older) to drive you home and stay with you that night.   If you are taking public transportation, you will need to have a responsible adult (18 years or older) with you. Please confirm with your physician that it is acceptable to use public transportation.   Please call the Pre-admissions Testing Dept. at [phone] if you have any questions about these instructions.  Surgery Visitation Policy:  Patients undergoing a surgery or procedure may have one family member or support person with them as long as that person is not COVID-19 positive or experiencing its symptoms.  That person may remain in the waiting area during the procedure.  Inpatient Visitation:    Visiting hours are 7 a.m. to 8 p.m. Inpatients will be allowed two visitors daily. The visitors may change each day during the patient's stay. No visitors under the age of 26. Any visitor under the age of 35 must be accompanied by an adult. The visitor must pass COVID-19 screenings, use hand sanitizer when entering and exiting the patient's room and wear a mask at all times, including in the patient's room. Patients must also wear a mask when staff or their visitor are in the room. Masking is required regardless of vaccination status.

## 2020-09-11 NOTE — Progress Notes (Signed)
Perioperative Services Pre-Admission/Anesthesia Testing   Date: 09/11/20 Name: Kari Lee MRN:   [id number]  Re: Consideration of preoperative prophylactic antibiotic change   Request sent to: Poggi, Excell Seltzer, MD (routed and/or faxed via [facility])  Planned Surgical Procedure(s):    Case: [id number] Date/Time: 09/20/20 0715   Procedure: SHOULDER ARTHROSCOPY WITH DEBRIDEMENT, DECOMPRESSION, ROTATOR CUFF REPAIR AND BICEPS TENODESIS (Left: Shoulder)   Anesthesia type: Choice   Pre-op diagnosis:      Rotator cuff tendinitis, left M75.82     Nontraumatic complete tear of left rotator cuff M75.122     Tendinitis of upper biceps tendon of left shoulder M75.22   Location: [facility] OR ROOM 03 / [facility] ORS FOR ANESTHES[facility]   Surgeons: Christena Flake, MD     Notes: Patient has a documented allergy to PCN  Advising that PCN has caused her to experience low severity skin rash in the past.   Screened as appropriate for cephalosporin use during medication reconciliation No immediate angioedema, dysphagia, SOB, anaphylaxis symptoms. No severe rash involving mucous membranes or skin necrosis. No hospital admissions related to side effects of PCN/cephalosporin use.  No documented reaction to PCN or cephalosporin in the last 10 years.  Request:  As an evidence based approach to reducing the rate of incidence for post-operative SSI and the development of MDROs, could an agent with narrower coverage for preoperative prophylaxis in this patient's upcoming surgical course be considered?   Currently ordered preoperative prophylactic ABX: clindamycin.   Specifically requesting change to cephalosporin (CEFAZOLIN).   Please communicate decision with me and I will change the orders in Epic as per your direction.   Things to consider: Many patients report that they were "allergic" to PCN earlier in life, however this does not translate into a true lifelong allergy. Patients can lose sensitivity to specific IgE  antibodies over time if PCN is avoided (Kleris & Lugar, 2019).  Up to 10% of the adult population and 15% of hospitalized patients report an allergy to PCN, however clinical studies suggest that 90% of those reporting an allergy can tolerate PCN antibiotics (Kleris & Lugar, 2019).  Cross-sensitivity between PCN and cephalosporins has been documented as being as high as 10%, however this estimation included data believed to have been collected in a setting where there was contamination. Newer data suggests that the prevalence of cross-sensitivity between PCN and cephalosporins is actually estimated to be closer to 1% (Hermanides et al., 2018).   Patients labeled as PCN allergic, whether they are truly allergic or not, have been found to have inferior outcomes in terms of rates of serious infection, and these patients tend to have longer hospital stays [facility] & Lugar, 2019).  Treatment related secondary infections, such as Clostridioides difficile, have been linked to the improper use of broad spectrum antibiotics in patients improperly labeled as PCN allergic (Kleris & Lugar, 2019).  Anaphylaxis from cephalosporins is rare and the evidence suggests that there is no increased risk of an anaphylactic type reaction when cephalosporins are used in a PCN allergic patient (Pichichero, 2006).  Citations: Hermanides J, Lemkes BA, Prins Gwenyth Bender MW, Terreehorst I. Presumed ?-Lactam Allergy and Cross-reactivity in the Operating Theater: A Practical Approach. Anesthesiology. 2018 Aug;129(2):335-342. doi: 10.1097/ALN.0000000000002252. PMID: 95284132.  Kleris, R. S., & Lugar, P. L. (2019). Things We Do For No Reason: Failing to Question a Penicillin Allergy History. Journal of hospital medicine, 14(10), 7277517747. Advance online publication. airportbarriers.com  Pichichero, M. E. (2006). Cephalosporins can be prescribed safely for penicillin-allergic  patients. Journal of family medicine, 55(2),  106-112. Accessed: https://cdn.mdedge.com/files/s64fs-public/Document/September-2017/5502JFP_AppliedEvidence1.pdf   Quentin Mulling, MSN, APRN, FNP-C, CEN [facility]  Peri-operative Services Nurse Practitioner FAX: [phone] 09/11/20 12:39 PM

## 2020-09-11 NOTE — Progress Notes (Signed)
  Perioperative Services Pre-Admission/Anesthesia Testing     Date: 09/11/20  Name: KYLENE ZAMARRON MRN:   [id number]  Re: Change in ABX for upcoming surgery   Case: [id number] Date/Time: 09/20/20 0715   Procedure: SHOULDER ARTHROSCOPY WITH DEBRIDEMENT, DECOMPRESSION, ROTATOR CUFF REPAIR AND BICEPS TENODESIS (Left: Shoulder)   Anesthesia type: Choice   Pre-op diagnosis:      Rotator cuff tendinitis, left M75.82     Nontraumatic complete tear of left rotator cuff M75.122     Tendinitis of upper biceps tendon of left shoulder M75.22   Location: [facility] OR ROOM 03 / [facility]S FOR [facility]   Surgeons: Christena Flake, MD     Primary attending surgeon was consulted regarding consideration of therapeutic change in antimicrobial agent being used for preoperative prophylaxis in this patient's upcoming surgical case. Following analysis of the risk versus benefits, Dr. Joice Lofts, Excell Seltzer, MD advising that it would be acceptable to discontinue the ordered clindamycin and place an order for cefazolin 2 gm IV on call to the OR. Orders for this patient were amended by me following collaborative conversation with attending surgeon.  Quentin Mulling, MSN, APRN, FNP-C, CEN [facility]  Peri-operative Services Nurse Practitioner Phone: [phone] 09/11/20 4:44 PM

## 2020-09-20 ENCOUNTER — Ambulatory Visit
Admission: RE | Admit: 2020-09-20 | Discharge: 2020-09-20 | Disposition: A | Discharge status: Home / Self Care | Payer: Medicare HMO | Attending: Surgery | Admitting: Surgery

## 2020-09-20 ENCOUNTER — Ambulatory Visit: Payer: Medicare HMO

## 2020-09-20 ENCOUNTER — Ambulatory Visit: Payer: Medicare HMO | Admitting: Urgent Care

## 2020-09-20 ENCOUNTER — Encounter
Admission: RE | Disposition: A | Discharge status: Home / Self Care | Payer: Self-pay | Source: Home / Self Care | Attending: Surgery

## 2020-09-20 DIAGNOSIS — M25812 Other specified joint disorders, left shoulder: Secondary | ICD-10-CM | POA: Insufficient documentation

## 2020-09-20 DIAGNOSIS — Z419 Encounter for procedure for purposes other than remedying health state, unspecified: Secondary | ICD-10-CM

## 2020-09-20 DIAGNOSIS — M75122 Complete rotator cuff tear or rupture of left shoulder, not specified as traumatic: Secondary | ICD-10-CM | POA: Insufficient documentation

## 2020-09-20 DIAGNOSIS — Z882 Allergy status to sulfonamides status: Secondary | ICD-10-CM | POA: Insufficient documentation

## 2020-09-20 DIAGNOSIS — Z880 Allergy status to penicillin: Secondary | ICD-10-CM | POA: Diagnosis not present

## 2020-09-20 DIAGNOSIS — M7542 Impingement syndrome of left shoulder: Secondary | ICD-10-CM | POA: Diagnosis not present

## 2020-09-20 DIAGNOSIS — M7522 Bicipital tendinitis, left shoulder: Secondary | ICD-10-CM | POA: Insufficient documentation

## 2020-09-20 DIAGNOSIS — G8918 Other acute postprocedural pain: Secondary | ICD-10-CM | POA: Diagnosis not present

## 2020-09-20 DIAGNOSIS — M24112 Other articular cartilage disorders, left shoulder: Secondary | ICD-10-CM | POA: Diagnosis not present

## 2020-09-20 DIAGNOSIS — Z79899 Other long term (current) drug therapy: Secondary | ICD-10-CM | POA: Diagnosis not present

## 2020-09-20 DIAGNOSIS — M25512 Pain in left shoulder: Secondary | ICD-10-CM | POA: Diagnosis not present

## 2020-09-20 DIAGNOSIS — M7582 Other shoulder lesions, left shoulder: Secondary | ICD-10-CM | POA: Diagnosis not present

## 2020-09-20 HISTORY — PX: SHOULDER ARTHROSCOPY WITH SUBACROMIAL DECOMPRESSION, ROTATOR CUFF REPAIR AND BICEP TENDON REPAIR: SHX5687

## 2020-09-20 SURGERY — SHOULDER ARTHROSCOPY WITH SUBACROMIAL DECOMPRESSION, ROTATOR CUFF REPAIR AND BICEP TENDON REPAIR
Anesthesia: Regional | Site: Shoulder | Laterality: Left

## 2020-09-20 MED ORDER — CHLORHEXIDINE GLUCONATE 0.12 % MT SOLN
OROMUCOSAL | Status: AC
  Administered 2020-09-20: 15 mL via OROMUCOSAL
  Filled 2020-09-20: qty 15

## 2020-09-20 MED ORDER — BUPIVACAINE-EPINEPHRINE 0.5% -1:200000 IJ SOLN
INTRAMUSCULAR | Status: DC | PRN
  Administered 2020-09-20: 30 mL

## 2020-09-20 MED ORDER — 0.9 % SODIUM CHLORIDE (POUR BTL) OPTIME
TOPICAL | Status: DC | PRN
  Administered 2020-09-20: 500 mL

## 2020-09-20 MED ORDER — BUPIVACAINE LIPOSOME 1.3 % IJ SUSP
INTRAMUSCULAR | Status: AC
  Filled 2020-09-20: qty 20

## 2020-09-20 MED ORDER — BUPIVACAINE HCL (PF) 0.5 % IJ SOLN
INTRAMUSCULAR | Status: DC | PRN
  Administered 2020-09-20: 10 mL via PERINEURAL

## 2020-09-20 MED ORDER — OXYCODONE HCL 5 MG/5ML PO SOLN: 5.0000 mg | Freq: Once | ORAL | Status: DC | PRN

## 2020-09-20 MED ORDER — LIDOCAINE HCL (CARDIAC) PF 100 MG/5ML IV SOSY
PREFILLED_SYRINGE | INTRAVENOUS | Status: DC | PRN
  Administered 2020-09-20: 100 mg via INTRAVENOUS

## 2020-09-20 MED ORDER — CHLORHEXIDINE GLUCONATE 0.12 % MT SOLN: 15.0000 mL | Freq: Once | OROMUCOSAL | Status: AC

## 2020-09-20 MED ORDER — CEFAZOLIN SODIUM-DEXTROSE 2-4 GM/100ML-% IV SOLN
2.0000 g | Freq: Once | INTRAVENOUS | Status: AC
  Administered 2020-09-20: 2 g via INTRAVENOUS

## 2020-09-20 MED ORDER — SODIUM CHLORIDE 0.9 % IV SOLN
INTRAVENOUS | Status: DC | PRN
  Administered 2020-09-20: 30 ug/min via INTRAVENOUS

## 2020-09-20 MED ORDER — DEXMEDETOMIDINE (PRECEDEX) IN NS 20 MCG/5ML (4 MCG/ML) IV SYRINGE
PREFILLED_SYRINGE | INTRAVENOUS | Status: DC | PRN
  Administered 2020-09-20: 8 ug via INTRAVENOUS

## 2020-09-20 MED ORDER — LACTATED RINGERS IV SOLN: INTRAVENOUS | Status: DC | PRN

## 2020-09-20 MED ORDER — ONDANSETRON HCL 4 MG/2ML IJ SOLN
INTRAMUSCULAR | Status: DC | PRN
  Administered 2020-09-20: 4 mg via INTRAVENOUS

## 2020-09-20 MED ORDER — FENTANYL CITRATE (PF) 100 MCG/2ML IJ SOLN: 25.0000 ug | INTRAMUSCULAR | Status: DC | PRN

## 2020-09-20 MED ORDER — BUPIVACAINE HCL (PF) 0.5 % IJ SOLN
INTRAMUSCULAR | Status: AC
  Filled 2020-09-20: qty 10

## 2020-09-20 MED ORDER — EPHEDRINE SULFATE 50 MG/ML IJ SOLN
INTRAMUSCULAR | Status: DC | PRN
  Administered 2020-09-20: 10 mg via INTRAVENOUS

## 2020-09-20 MED ORDER — FAMOTIDINE 20 MG PO TABS
ORAL_TABLET | ORAL | Status: AC
  Administered 2020-09-20: 20 mg via ORAL
  Filled 2020-09-20: qty 1

## 2020-09-20 MED ORDER — KETOROLAC TROMETHAMINE 30 MG/ML IJ SOLN
INTRAMUSCULAR | Status: DC | PRN
  Administered 2020-09-20: 15 mg via INTRAVENOUS

## 2020-09-20 MED ORDER — FAMOTIDINE 20 MG PO TABS: 20.0000 mg | ORAL_TABLET | Freq: Once | ORAL | Status: AC

## 2020-09-20 MED ORDER — GLYCOPYRROLATE 0.2 MG/ML IJ SOLN
INTRAMUSCULAR | Status: AC
  Filled 2020-09-20: qty 1

## 2020-09-20 MED ORDER — FENTANYL CITRATE (PF) 100 MCG/2ML IJ SOLN
INTRAMUSCULAR | Status: AC
  Filled 2020-09-20: qty 2

## 2020-09-20 MED ORDER — ACETAMINOPHEN 10 MG/ML IV SOLN: 1000.0000 mg | Freq: Once | INTRAVENOUS | Status: DC | PRN

## 2020-09-20 MED ORDER — SODIUM CHLORIDE 0.9 % IR SOLN
Status: DC | PRN
  Administered 2020-09-20: 1000 mL

## 2020-09-20 MED ORDER — BUPIVACAINE LIPOSOME 1.3 % IJ SUSP
INTRAMUSCULAR | Status: DC | PRN
  Administered 2020-09-20: 15 mL via PERINEURAL

## 2020-09-20 MED ORDER — DEXAMETHASONE SODIUM PHOSPHATE 10 MG/ML IJ SOLN
INTRAMUSCULAR | Status: DC | PRN
  Administered 2020-09-20: 10 mg via INTRAVENOUS

## 2020-09-20 MED ORDER — KETOROLAC TROMETHAMINE 30 MG/ML IJ SOLN
INTRAMUSCULAR | Status: AC
  Filled 2020-09-20: qty 1

## 2020-09-20 MED ORDER — ONDANSETRON HCL 4 MG/2ML IJ SOLN: 4.0000 mg | Freq: Once | INTRAMUSCULAR | Status: DC | PRN

## 2020-09-20 MED ORDER — FENTANYL CITRATE PF 50 MCG/ML IJ SOSY: 50.0000 ug | PREFILLED_SYRINGE | Freq: Once | INTRAMUSCULAR | Status: AC

## 2020-09-20 MED ORDER — LACTATED RINGERS IV SOLN: INTRAVENOUS | Status: DC

## 2020-09-20 MED ORDER — ROCURONIUM BROMIDE 10 MG/ML (PF) SYRINGE
PREFILLED_SYRINGE | INTRAVENOUS | Status: AC
  Filled 2020-09-20: qty 20

## 2020-09-20 MED ORDER — CEFAZOLIN SODIUM-DEXTROSE 2-4 GM/100ML-% IV SOLN
INTRAVENOUS | Status: AC
  Filled 2020-09-20: qty 100

## 2020-09-20 MED ORDER — SUGAMMADEX SODIUM 200 MG/2ML IV SOLN
INTRAVENOUS | Status: DC | PRN
  Administered 2020-09-20: 200 mg via INTRAVENOUS

## 2020-09-20 MED ORDER — PROPOFOL 10 MG/ML IV BOLUS
INTRAVENOUS | Status: DC | PRN
  Administered 2020-09-20: 120 mg via INTRAVENOUS

## 2020-09-20 MED ORDER — DEXAMETHASONE SODIUM PHOSPHATE 10 MG/ML IJ SOLN
INTRAMUSCULAR | Status: AC
  Filled 2020-09-20: qty 1

## 2020-09-20 MED ORDER — MIDAZOLAM HCL 2 MG/2ML IJ SOLN: 1.0000 mg | Freq: Once | INTRAMUSCULAR | Status: AC

## 2020-09-20 MED ORDER — ORAL CARE MOUTH RINSE: 15.0000 mL | Freq: Once | OROMUCOSAL | Status: AC

## 2020-09-20 MED ORDER — BUPIVACAINE-EPINEPHRINE (PF) 0.5% -1:200000 IJ SOLN
INTRAMUSCULAR | Status: AC
  Filled 2020-09-20: qty 30

## 2020-09-20 MED ORDER — FENTANYL CITRATE (PF) 100 MCG/2ML IJ SOLN
INTRAMUSCULAR | Status: DC | PRN
  Administered 2020-09-20 (×2): 50 ug via INTRAVENOUS

## 2020-09-20 MED ORDER — EPHEDRINE 5 MG/ML INJ
INTRAVENOUS | Status: AC
  Filled 2020-09-20: qty 5

## 2020-09-20 MED ORDER — GLYCOPYRROLATE 0.2 MG/ML IJ SOLN
INTRAMUSCULAR | Status: DC | PRN
Start: 2020-09-20 — End: 2020-09-20
  Administered 2020-09-20: .2 mg via INTRAVENOUS

## 2020-09-20 MED ORDER — MIDAZOLAM HCL 2 MG/2ML IJ SOLN
INTRAMUSCULAR | Status: AC
  Administered 2020-09-20: 1 mg via INTRAVENOUS
  Filled 2020-09-20: qty 2

## 2020-09-20 MED ORDER — OXYCODONE HCL 5 MG PO TABS: 5.0000 mg | ORAL_TABLET | Freq: Once | ORAL | Status: DC | PRN

## 2020-09-20 MED ORDER — EPINEPHRINE PF 1 MG/ML IJ SOLN
INTRAMUSCULAR | Status: AC
  Filled 2020-09-20: qty 1

## 2020-09-20 MED ORDER — OXYCODONE HCL 5 MG PO TABS: 5.0000 mg | ORAL_TABLET | ORAL | 0 refills | Status: DC | PRN

## 2020-09-20 MED ORDER — ONDANSETRON HCL 4 MG/2ML IJ SOLN
INTRAMUSCULAR | Status: AC
  Filled 2020-09-20: qty 2

## 2020-09-20 MED ORDER — ROCURONIUM BROMIDE 100 MG/10ML IV SOLN
INTRAVENOUS | Status: DC | PRN
  Administered 2020-09-20: 50 mg via INTRAVENOUS

## 2020-09-20 MED ORDER — FENTANYL CITRATE PF 50 MCG/ML IJ SOSY
PREFILLED_SYRINGE | INTRAMUSCULAR | Status: AC
  Administered 2020-09-20: 50 ug via INTRAVENOUS
  Filled 2020-09-20: qty 1

## 2020-09-20 SURGICAL SUPPLY — 56 items
ANCH SUT 2 2.9 2 LD TPR NDL (Anchor) ×1 IMPLANT
ANCH SUT 2 2/0 ABS BRD STRL (Anchor) ×1 IMPLANT
ANCH SUT 5.5 KNTLS (Anchor) ×2 IMPLANT
ANCH SUT Q-FX 2.8 (Anchor) ×2 IMPLANT
ANCHOR ALL-SUT Q-FIX 2.8 (Anchor) ×4 IMPLANT
ANCHOR HEALICOIL REGEN 5.5 (Anchor) ×4 IMPLANT
ANCHOR JUGGERKNOT WTAP NDL 2.9 (Anchor) ×2 IMPLANT
ANCHOR SUT W/ ORTHOCORD (Anchor) ×2 IMPLANT
APL PRP STRL LF DISP 70% ISPRP (MISCELLANEOUS) ×1
BIT DRILL JUGRKNT W/NDL BIT2.9 (DRILL) ×1 IMPLANT
BLADE FULL RADIUS 3.5 (BLADE) ×2 IMPLANT
BUR ACROMIONIZER 4.0 (BURR) ×2 IMPLANT
CANNULA SHAVER 8MMX76MM (CANNULA) ×2 IMPLANT
CHLORAPREP W/TINT 26 (MISCELLANEOUS) ×2 IMPLANT
COVER MAYO STAND REUSABLE (DRAPES) ×2 IMPLANT
DILATOR 5.5 THREADED HEALICOIL (MISCELLANEOUS) ×2 IMPLANT
DRAPE IMP U-DRAPE 54X76 (DRAPES) ×4 IMPLANT
DRILL JUGGERKNOT W/NDL BIT 2.9 (DRILL) ×2
ELECT CAUTERY BLADE 6.4 (BLADE) ×2 IMPLANT
ELECT REM PT RETURN 9FT ADLT (ELECTROSURGICAL) ×2
ELECTRODE REM PT RTRN 9FT ADLT (ELECTROSURGICAL) ×1 IMPLANT
GAUZE SPONGE 4X4 12PLY STRL (GAUZE/BANDAGES/DRESSINGS) ×2 IMPLANT
GAUZE XEROFORM 1X8 LF (GAUZE/BANDAGES/DRESSINGS) ×2 IMPLANT
GLOVE SRG 8 PF TXTR STRL LF DI (GLOVE) ×1 IMPLANT
GLOVE SURG ENC MOIS LTX SZ7.5 (GLOVE) ×4 IMPLANT
GLOVE SURG ENC MOIS LTX SZ8 (GLOVE) ×4 IMPLANT
GLOVE SURG UNDER LTX SZ8 (GLOVE) ×2 IMPLANT
GLOVE SURG UNDER POLY LF SZ8 (GLOVE) ×2
GOWN STRL REUS W/ TWL LRG LVL3 (GOWN DISPOSABLE) ×1 IMPLANT
GOWN STRL REUS W/ TWL XL LVL3 (GOWN DISPOSABLE) ×1 IMPLANT
GOWN STRL REUS W/TWL LRG LVL3 (GOWN DISPOSABLE) ×2
GOWN STRL REUS W/TWL XL LVL3 (GOWN DISPOSABLE) ×2
GRASPER SUT 15 45D LOW PRO (SUTURE) ×2 IMPLANT
IV LACTATED RINGER IRRG 3000ML (IV SOLUTION) ×2
IV LR IRRIG 3000ML ARTHROMATIC (IV SOLUTION) ×1 IMPLANT
KIT CANNULA 8X76-LX IN CANNULA (CANNULA) ×2 IMPLANT
KIT SUTURE 2.8 Q-FIX DISP (MISCELLANEOUS) ×2 IMPLANT
MANIFOLD NEPTUNE II (INSTRUMENTS) ×4 IMPLANT
MASK FACE SPIDER DISP (MASK) ×2 IMPLANT
MAT ABSORB  FLUID 56X50 GRAY (MISCELLANEOUS) ×1
MAT ABSORB FLUID 56X50 GRAY (MISCELLANEOUS) ×1 IMPLANT
PACK ARTHROSCOPY SHOULDER (MISCELLANEOUS) ×2 IMPLANT
PASSER SUT FIRSTPASS SELF (INSTRUMENTS) ×2 IMPLANT
SLING ARM LRG DEEP (SOFTGOODS) IMPLANT
SLING ULTRA II LG (MISCELLANEOUS) IMPLANT
SPONGE T-LAP 18X18 ~~LOC~~+RFID (SPONGE) ×2 IMPLANT
STAPLER SKIN PROX 35W (STAPLE) ×2 IMPLANT
STRAP SAFETY 5IN WIDE (MISCELLANEOUS) ×2 IMPLANT
SUT ETHIBOND 0 MO6 C/R (SUTURE) ×2 IMPLANT
SUT ULTRABRAID 2 COBRAID 38 (SUTURE) IMPLANT
SUT VIC AB 2-0 CT1 27 (SUTURE) ×4
SUT VIC AB 2-0 CT1 TAPERPNT 27 (SUTURE) ×2 IMPLANT
TAPE MICROFOAM 4IN (TAPE) ×2 IMPLANT
TUBING INFLOW SET DBFLO PUMP (TUBING) ×2 IMPLANT
WAND WEREWOLF FLOW 90D (MISCELLANEOUS) ×2 IMPLANT
WATER STERILE IRR 500ML POUR (IV SOLUTION) ×2 IMPLANT

## 2020-09-20 NOTE — Anesthesia Procedure Notes (Signed)
Anesthesia Regional Block: Interscalene brachial plexus block   Pre-Anesthetic Checklist: , timeout performed,  Correct Patient, Correct Site, Correct Laterality,  Correct Procedure, Correct Position, site marked,  Risks and benefits discussed,  Surgical consent,  Pre-op evaluation,  At surgeon's request and post-op pain management  Laterality: Left  Prep: chloraprep       Needles:  Injection technique: Single-shot  Needle Type: Echogenic Needle     Needle Length: 4cm  Needle Gauge: 25     Additional Needles:   Narrative:  Start time: 09/20/2020 10:08 AM End time: 09/20/2020 10:10 AM Injection made incrementally with aspirations every 5 mL.  Performed by: Personally  Anesthesiologist: Corinda Gubler, MD  Additional Notes: Patient's chart reviewed and they were deemed appropriate candidate for procedure, at surgeon's request. Patient educated about risks, benefits, and alternatives of the block including but not limited to: temporary or permanent nerve damage, bleeding, infection, damage to surround tissues, pneumothorax, hemidiaphragmatic paralysis, unilateral Horner's syndrome, block failure, local anesthetic toxicity. Patient expressed understanding. A formal time-out was conducted consistent with institution rules.  Monitors were applied, and minimal sedation used (see nursing record). The site was prepped with skin prep and allowed to dry, and sterile gloves were used. A high frequency linear ultrasound probe with probe cover was utilized throughout. C5-7 nerve roots located and appeared anatomically normal, local anesthetic injected around them, and echogenic block needle trajectory was monitored throughout. Aspiration performed every 51ml. Lung and blood vessels were avoided. All injections were performed without resistance and free of blood and paresthesias. The patient tolerated the procedure well.  Injectate: 67ml exparel + 31ml 0.5% bupivacaine

## 2020-09-20 NOTE — Anesthesia Postprocedure Evaluation (Signed)
Anesthesia Post Note  Patient: Kari Lee  Procedure(s) Performed: SHOULDER ARTHROSCOPY WITH DEBRIDEMENT, DECOMPRESSION, ROTATOR CUFF REPAIR AND BICEPS TENODESIS (Left: Shoulder)  Patient location during evaluation: PACU Anesthesia Type: General Level of consciousness: awake and alert Pain management: pain level controlled Vital Signs Assessment: post-procedure vital signs reviewed and stable Respiratory status: spontaneous breathing, nonlabored ventilation, respiratory function stable and patient connected to nasal cannula oxygen Cardiovascular status: blood pressure returned to baseline and stable Postop Assessment: no apparent nausea or vomiting Anesthetic complications: no   No notable events documented.   Last Vitals:  Vitals:   09/20/20 1400 09/20/20 1421  BP: 125/62 140/79  Pulse: (!) 53 62  Resp: 14 16  Temp:  (!) 36.3 C  SpO2: 96% 94%    Last Pain:  Vitals:   09/20/20 1421  TempSrc: Temporal  PainSc: 0-No pain                 Corinda Gubler

## 2020-09-20 NOTE — Op Note (Signed)
09/20/2020  1:23 PM  Patient:   Kari Lee  Pre-Op Diagnosis:   Impingement/tendinopathy with rotator cuff tear, left shoulder.  Post-Op Diagnosis:   Impingement/tendinopathy with full-thickness rotator cuff tear degenerative labral fraying, and biceps tendinopathy, left shoulder.  Procedure:   Limited arthroscopic debridement, arthroscopic repair of subscapularis tendon tear, arthroscopic subacromial decompression, mini-open repair of supraspinatus and infraspinatus tendon tears, and mini-open biceps tenodesis, left shoulder.  Anesthesia:   General endotracheal with interscalene block using Exparel placed preoperatively by the anesthesiologist.  Surgeon:   Maryagnes Amos, MD  Assistant:   Horris Latino, PA-C  Findings:   As above. There was moderate fraying of the superior portion of the labrum without frank detachment from the glenoid rim. There was a near full-thickness heart articular sided tear involving the superior insertional fibers of the subscapularis tendon, as well as a full-thickness tear involving the entire supraspinatus tendon and anterior portion of the infraspinatus tendon. The biceps tendon demonstrated evidence of tendinopathy with mild lip sticking, but there was no evidence for partial or full-thickness tearing. Finally, there were focal areas of grade II-III chondromalacia of the glenoid. The articular surface of the humeral head was in satisfactory condition  Complications:   None  Fluids:   1000 cc  Estimated blood loss:   10 cc  Tourniquet time:   None  Drains:   None  Closure:   Staples      Brief clinical note:   The patient is a 75 year old female with a history of progressive worsening pain and weakness of her left shoulder. The patient's symptoms have progressed despite medications, activity modification, etc. The patient's history and examination are consistent with impingement/tendinopathy with a rotator cuff tear. These findings were confirmed by  MRI scan. The patient presents at this time for definitive management of her shoulder symptoms.  Procedure:   The patient underwent placement of an interscalene block using Exparel by the anesthesiologist in the preoperative holding area before being brought into the operating room and lain in the supine position. The patient then underwent general endotracheal intubation and anesthesia before being repositioned in the beach chair position using the beach chair positioner. The left shoulder and upper extremity were prepped with ChloraPrep solution before being draped sterilely. Preoperative antibiotics were administered. A timeout was performed to confirm the proper surgical site before the expected portal sites and incision site were injected with 0.5% Sensorcaine with epinephrine.   A posterior portal was created and the glenohumeral joint thoroughly inspected with the findings as described above. An anterior portal was created using an outside-in technique. The labrum and rotator cuff were further probed, again confirming the above-noted findings. Areas of labral fraying were debrided back to stable margins using the full-radius resector, as were areas of synovitis. The torn portions of the rotator cuff also were debrided back to stable margins using the full-radius resector. The ArthroCare wand was inserted and used to release the biceps tendon from its labral anchor. It also was used to obtain hemostasis as well as to "anneal" the labrum superiorly and anteriorly. The instruments were removed from the joint after suctioning the excess fluid.  The camera was repositioned through the posterior portal into the subacromial space. A separate lateral portal was created using an outside-in technique. The 3.5 mm full-radius resector was introduced and used to perform a subtotal bursectomy. The ArthroCare wand was then inserted and used to remove the periosteal tissue off the undersurface of the anterior third of  the  acromion as well as to recess the coracoacromial ligament from its attachment along the anterior and lateral margins of the acromion. The 4.0 mm acromionizing bur was introduced and used to complete the decompression by removing the undersurface of the anterior third of the acromion. The full radius resector was reintroduced to remove any residual bony debris before the ArthroCare wand was reintroduced to obtain hemostasis. The instruments were then removed from the subacromial space after suctioning the excess fluid.  An approximately 4-5 cm incision was made over the anterolateral aspect of the shoulder beginning at the anterolateral corner of the acromion and extending distally in line with the bicipital groove. This incision was carried down through the subcutaneous tissues to expose the deltoid fascia. The raphae between the anterior and middle thirds was identified and this plane developed to provide access into the subacromial space. Additional bursal tissues were debrided sharply using Metzenbaum scissors. The rotator cuff tear was readily identified. The margins were debrided sharply with a #15 blade and the exposed greater tuberosity roughened with a rongeur. The tear was repaired using two [employer] 2.8 mm Q-Fix anchors. These sutures were then brought back laterally and secured using two [place] QuatroLink anchors to create a two-layer closure. An apparent watertight closure was obtained.  The bicipital groove was identified by palpation and opened for 1-1.5 cm. The biceps tendon stump was retrieved through this defect. The floor of the bicipital groove was roughened with a curet before a single Biomet 2.9 mm JuggerKnot anchor was inserted. Both sets of sutures were passed through the biceps tendon and tied securely to effect the tenodesis. The bicipital sheath was reapproximated using two #0 Ethibond interrupted sutures, incorporating the biceps tendon to further reinforce the  tenodesis.  The wound was copiously irrigated with sterile saline solution before the deltoid raphae was reapproximated using 2-0 Vicryl interrupted sutures. The subcutaneous tissues were closed in two layers using 2-0 Vicryl interrupted sutures before the skin was closed using staples. The portal sites also were closed using staples. A sterile bulky dressing was applied to the shoulder before the arm was placed into a shoulder immobilizer, incorporating a Polar Care device. The patient was then awakened, extubated, and returned to the recovery room in satisfactory condition after tolerating the procedure well.

## 2020-09-20 NOTE — Anesthesia Procedure Notes (Signed)
Procedure Name: Intubation Date/Time: 09/20/2020 11:42 AM Performed by: Katherine Basset, CRNA Pre-anesthesia Checklist: Patient identified, Emergency Drugs available, Suction available and Patient being monitored Patient Re-evaluated:Patient Re-evaluated prior to induction Oxygen Delivery Method: Circle system utilized Preoxygenation: Pre-oxygenation with 100% oxygen Induction Type: IV induction Ventilation: Mask ventilation without difficulty Laryngoscope Size: McGraph and 3 Grade View: Grade I Tube type: Oral Tube size: 6.5 mm Number of attempts: 1 Airway Equipment and Method: Stylet, Oral airway and Bite block Placement Confirmation: ETT inserted through vocal cords under direct vision, positive ETCO2 and breath sounds checked- equal and bilateral Secured at: 21 cm Tube secured with: Tape Dental Injury: Teeth and Oropharynx as per pre-operative assessment

## 2020-09-20 NOTE — Discharge Instructions (Addendum)
Orthopedic discharge instructions: Keep dressing dry and intact.  May shower after dressing changed on post-op day #4 (Monday).  Cover staples with Band-Aids after drying off. Apply ice frequently to shoulder or use Polar Care device. Take celebrex 200 mg 1-2x/day OR ibuprofen 600-800 mg TID with meals for 5-7 days, then as necessary. Take oxycodone as prescribed when needed.  May supplement with ES Tylenol if necessary. Keep shoulder immobilizer on at all times except may remove for bathing purposes. Follow-up in 10-14 days or as scheduled.  AMBULATORY SURGERY  DISCHARGE INSTRUCTIONS   The drugs that you were given will stay in your system until tomorrow so for the next 24 hours you should not:  Drive an automobile Make any legal decisions Drink any alcoholic beverage   You may resume regular meals tomorrow.  Today it is better to start with liquids and gradually work up to solid foods.  You may eat anything you prefer, but it is better to start with liquids, then soup and crackers, and gradually work up to solid foods.   Please notify your doctor immediately if you have any unusual bleeding, trouble breathing, redness and pain at the surgery site, drainage, fever, or pain not relieved by medication.    Your post-operative visit with Dr.                                       is: Date:                        Time:    Please call to schedule your post-operative visit.  Additional Instructions:

## 2020-09-20 NOTE — Anesthesia Preprocedure Evaluation (Signed)
Anesthesia Evaluation  Patient identified by MRN, date of birth, ID band Patient awake    Reviewed: Allergy & Precautions, NPO status , Patient's Chart, lab work & pertinent test results  History of Anesthesia Complications Negative for: history of anesthetic complications  Airway Mallampati: II  TM Distance: >3 FB Neck ROM: Full    Dental  (+) Teeth Intact   Pulmonary neg pulmonary ROS, neg sleep apnea, neg COPD, Patient abstained from smoking.Not current smoker,    Pulmonary exam normal breath sounds clear to auscultation       Cardiovascular Exercise Tolerance: Good METShypertension, (-) CAD and (-) Past MI + dysrhythmias  Rhythm:Regular Rate:Normal - Systolic murmurs Congenital bradycardia   Neuro/Psych PSYCHIATRIC DISORDERS Anxiety Depression Cervical spine stenosis. 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 Kennedy Bucker with [facility] No date: Obesity No date: Rosacea No date: Spinal stenosis No date: Spondylosis of cervical spine  Reproductive/Obstetrics                            Anesthesia Physical Anesthesia Plan  ASA:  2  Anesthesia Plan: General   Post-op Pain Management:  Regional for Post-op pain   Induction: Intravenous  PONV Risk Score and Plan: 3 and Ondansetron, Dexamethasone and Midazolam  Airway Management Planned: Oral ETT  Additional Equipment: None  Intra-op Plan:   Post-operative Plan: Extubation in OR  Informed Consent: I have reviewed the patients History and Physical, chart, labs and discussed the procedure including the risks, benefits and alternatives for the proposed anesthesia with the patient or authorized representative who has indicated his/her understanding and acceptance.     Dental advisory given  Plan Discussed with: CRNA and Surgeon  Anesthesia Plan Comments: (Discussed risks of anesthesia with patient, including PONV, sore throat, lip/dental damage. Rare risks discussed as well, such as cardiorespiratory and neurological sequelae, and allergic reactions. Patient understands. Discussed r/b/a of interscalene block, including elective nature. Risks discussed: - Rare: bleeding, infection, nerve damage - shortness of breath from hemidiaphragmatic paralysis - unilateral horner's syndrome - poor/non-working blocks Patient understands and agrees. )        Anesthesia Quick Evaluation

## 2020-09-20 NOTE — H&P (Signed)
History of Present Illness:  Kari Lee is a 75 y.o. female who presents today as a result of a referral by Dr. Kennedy Bucker for left shoulder pain.   The patient describes the symptoms as moderate (patient is active but has had to make modifications or give up activities) and have the quality of being aching, miserable, nagging, stabbing and tender. The pain is localized to the anterior shoulder. These symptoms are aggravated with normal daily activities, with sleeping, at higher levels of activity, with overhead activity, reaching behind the back and getting dressed. She has tried acetaminophen and non-steroidal anti-inflammatories (Celebrex ) with temporary partial relief. She has tried rest with limited benefit. She has not tried any formal steroid injections or physical therapy for these symptoms. She denies any prior problems with the left shoulder.  This complaint is not work related. She is a sports non-participant.  Shoulder Surgical History:  The patient has had no shoulder surgery in the past.  PMH/PSH/Family History/Social History/Meds/Allergies:  I have reviewed past medical, surgical, social and family history, medications and allergies as documented in the EMR.  Current Outpatient Medications:  ACETAMINOPHEN (TYLENOL EXTRA STRENGTH ORAL) Take 500 mg by mouth 2 (two) times daily.   ascorbic acid, vitamin C, (VITAMIN C) 1000 MG tablet Take 1 tablet by mouth once daily   atorvastatin (LIPITOR) 40 MG tablet Take 1 tablet (40 mg total) by mouth once daily. 90 tablet 1   celecoxib (CELEBREX) 200 MG capsule Take 200 mg by mouth 2 (two) times daily   cholecalciferol (VITAMIN D3) 2,000 unit capsule Take 1 capsule by mouth once daily   diazePAM (VALIUM) 2 MG tablet Please take 1 tablet 1 hour prior to arrival and 1 tablet at time of arrival. 2 tablet 0   diazePAM (VALIUM) 5 MG tablet 1-2 tabs 30 minutes before ESI 2 tablet 0   diphenhydrAMINE (BENADRYL) 25 mg capsule Take 25 mg by mouth  nightly as needed for Itching   estradioL (ESTRACE) 0.5 MG tablet Take 0.5 mg by mouth once daily   losartan (COZAAR) 50 MG tablet Take 1 tablet by mouth once daily   multivitamin tablet Take 1 tablet by mouth once daily.   Allergies:   Penicillins Rash, nausea   Sulfa (Sulfonamide Antibiotics) Nausea, Rash   Past Medical History:   Anxiety   Arthritis   Bradycardia (Congenitial)   Current long-term use of postmenopausal hormone replacement therapy  Has been tried to be weaned off in the past, unsuccessfully. Pt is aware of risk/benefit & states benefit currently outweighs risk for her.   GERD (gastroesophageal reflux disease)   H/O bronchitis   History of positive PPD 2009  Tx'd w/ INH through health dept. Has need CXR's for clearance.   Hyperlipidemia   Hypertension   Insomnia   Obesity (BMI 30-39.9), unspecified   Radial styloid fracture  Noted on Rt wrist x-ray 03/09/15: Old nonunited fx of the radial styloid process.   Rosacea   Past Surgical History:   BUNION CORRECTION Right 2005   CERVICAL CONE BIOPSY (Pre-cancerous cells that were tx)   COLONOSCOPY 10/2010 (Dr. Niel Hummer. Done after episode of GI bleeding)   COLONOSCOPY 2004 w/ polypectomy   ENDOSCOPIC CARPAL TUNNEL RELEASE Bilateral 2008   HYSTERECTOMY 1981 (TAH/BSO 2/2 endometriosis)   INCISION TENDON SHEATH FOR TRIGGER FINGER Right   JOINT REPLACEMENT Right knee 11/21/10   JOINT REPLACEMENT Left hip 11/04/11   JOINT REPLACEMENT Left knee 05/18/12   KNEE ARTHROSCOPY Right 07/30/2010  KNEE ARTHROSCOPY Left 12/18/11   Family History:   Osteoporosis (Thinning of bones) Mother   Alzheimer's disease Mother   High blood pressure (Hypertension) Father   Stroke Father 51   High blood pressure (Hypertension) Sister   Alcohol abuse Brother   Breast cancer Maternal Aunt   Osteoporosis (Thinning of bones) Maternal Aunt   Social History:   Socioeconomic History:   Marital status: Married  Spouse name: Maisie Fus   Number  of children: 2  Occupational History   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   2nd daughter lives in [place] w/ her 5 children. Pt visits [place] during the summer.   Divorced from 1st husband when 2nd daughter was 4 yo. Her 2nd husband did adopt daughter.   Review of Systems:  A comprehensive 14 point ROS was performed, reviewed, and the pertinent orthopaedic findings are documented in the HPI.  Physical Exam:  There were no vitals filed for this visit. General/Constitutional: The patient appears to be well-nourished, well-developed, and in no acute distress. Neuro/Psych: Normal mood and affect, oriented to person, place and time. Eyes: Non-icteric. Pupils are equal, round, and reactive to light, and exhibit synchronous movement. ENT: Unremarkable. Lymphatic: No palpable adenopathy. Respiratory: No wheezes and Non-labored breathing Cardiovascular: No edema, swelling or tenderness, except as noted in detailed exam. Integumentary: No impressive skin lesions present, except as noted in detailed exam. Musculoskeletal: Unremarkable, except as noted in detailed exam.  Left shoulder exam: SKIN: normal SWELLING: none WARMTH: none LYMPH NODES: no adenopathy palpable CREPITUS: none TENDERNESS: Mild-moderate focal tenderness to palpation over the anterior shoulder along the bicipital groove ROM (active):  Forward flexion: 160 degrees Abduction: 155 degrees Internal rotation: L1 ROM (passive):  Forward flexion: 165 degrees Abduction: 160 degrees  ER/IR at 90 abd: 90 degrees /  65 degrees  She has mild pain at the extremes of forward flexion, abduction, internal rotation, and internal rotation at 90 degrees of abduction.  STRENGTH: Forward flexion: 3+-4/5 Abduction: 3+-4/5 External rotation: 4/5 Internal rotation: 4+/5 Pain with RC testing: Moderate pain with resisted forward flexion and abduction, and mild pain with resisted external rotation  STABILITY: Normal  SPECIAL TESTS: Juanetta Gosling' test: positive, moderate Speed's test: negative Capsulitis - pain w/ passive ER: no Crossed arm test: Minimally positive Crank: Not evaluated Anterior apprehension: Negative Posterior apprehension: Not evaluated  She is neurovascularly intact to the left upper extremity.  Left Shoulder Imaging, MRI: MRI Shoulder Cartilage: No cartilage abnormality. MRI Shoulder Rotator Cuff: Full thickness tear of the supraspinatus. Retracted to the humeral head. MRI Shoulder Labrum / Biceps: No labral tear or biceps abormality. MRI Shoulder Bone: Normal bone.  Both the films and report were reviewed by myself and discussed with the patient.  Assessment:   Rotator cuff tendinitis, left Yes   Nontraumatic complete tear of left rotator cuff   Tendinitis of upper biceps tendon of left shoulder   Plan:  The treatment options were discussed with the patient. In addition, patient educational materials were provided regarding the diagnosis and treatment options. Based on  the patient's physical examination findings and MRI results, I feel that the majority of her symptoms are actually coming from her shoulder rather than from her neck. The patient is quite frustrated by her symptoms and function limitations, and is ready to consider more aggressive treatment options. Therefore, I have recommended a surgical procedure, specifically a left shoulder arthroscopy with debridement, decompression, rotator cuff repair, and probable biceps tenodesis. The procedure was discussed with the patient, as were  the potential risks (including bleeding, infection, nerve and/or blood vessel injury, persistent or recurrent pain, failure of the repair, progression of arthritis, need for further surgery, blood clots, strokes, heart attacks and/or arhythmias, pneumonia, etc.) and benefits. The patient states her understanding and wishes to proceed.  A formal written consent will be obtained by the nursing staff.  She will follow up post-op, routine.   H&P reviewed and patient re-examined. No changes.

## 2020-09-20 NOTE — Transfer of Care (Signed)
Immediate Anesthesia Transfer of Care Note  Patient: Kari Lee  Procedure(s) Performed: SHOULDER ARTHROSCOPY WITH DEBRIDEMENT, DECOMPRESSION, ROTATOR CUFF REPAIR AND BICEPS TENODESIS (Left: Shoulder)  Patient Location: PACU  Anesthesia Type:General and Regional  Level of Consciousness: drowsy  Airway & Oxygen Therapy: Patient Spontanous Breathing and Patient connected to nasal cannula oxygen  Post-op Assessment: Report given to RN and Post -op Vital signs reviewed and stable  Post vital signs: Reviewed and stable  Last Vitals:  Vitals Value Taken Time  BP 132/73 09/20/20 1345  Temp 36.4 C 09/20/20 1336  Pulse 55 09/20/20 1349  Resp 12 09/20/20 1349  SpO2 99 % 09/20/20 1349  Vitals shown include unvalidated device data.  Last Pain:  Vitals:   09/20/20 1336  PainSc: 0-No pain         Complications: No notable events documented.

## 2020-09-21 ENCOUNTER — Encounter: Payer: Self-pay | Admitting: Surgery

## 2020-09-25 DIAGNOSIS — M6281 Muscle weakness (generalized): Secondary | ICD-10-CM | POA: Diagnosis not present

## 2020-09-25 DIAGNOSIS — M25612 Stiffness of left shoulder, not elsewhere classified: Secondary | ICD-10-CM | POA: Diagnosis not present

## 2020-09-25 DIAGNOSIS — Z9889 Other specified postprocedural states: Secondary | ICD-10-CM | POA: Diagnosis not present

## 2020-09-25 DIAGNOSIS — G8929 Other chronic pain: Secondary | ICD-10-CM | POA: Diagnosis not present

## 2020-09-25 DIAGNOSIS — M25512 Pain in left shoulder: Secondary | ICD-10-CM | POA: Diagnosis not present

## 2020-10-05 DIAGNOSIS — M25512 Pain in left shoulder: Secondary | ICD-10-CM | POA: Diagnosis not present

## 2020-10-05 DIAGNOSIS — M6281 Muscle weakness (generalized): Secondary | ICD-10-CM | POA: Diagnosis not present

## 2020-10-05 DIAGNOSIS — M25612 Stiffness of left shoulder, not elsewhere classified: Secondary | ICD-10-CM | POA: Diagnosis not present

## 2020-10-05 DIAGNOSIS — G8929 Other chronic pain: Secondary | ICD-10-CM | POA: Diagnosis not present

## 2020-10-05 DIAGNOSIS — Z9889 Other specified postprocedural states: Secondary | ICD-10-CM | POA: Diagnosis not present

## 2020-10-11 ENCOUNTER — Ambulatory Visit: Payer: Self-pay | Admitting: *Deleted

## 2020-10-11 NOTE — Telephone Encounter (Signed)
Pt called in c/o having mouth pain.   See triage notes for details.   She had rotator cuff surgery on 09/20/2020 and was intubated.   She is wondering if the intubation irritated something.  I sending a note to see if Della Goo, NP can see her.   [facility] does not schedule for her so note sent to [facility] requesting an appt with Raynelle Fanning.

## 2020-10-11 NOTE — Telephone Encounter (Signed)
Reason for Disposition  [1] MILD-MODERATE mouth pain AND [2] present > 3 days  Answer Assessment - Initial Assessment Questions 1. ONSET: "When did the mouth start hurting?" (e.g., hours or days ago)      She is having pain in her mouth along the gum line on the bottom.   I can see some inflammation in my throat.   The pain radiates almost like sinus pain.   Also ear involvement too.   It's where I can't see it.    Behind my ear and down along my jawl ine.   I had surgery 3 weeks ago for rotator cuff repair and was intubated.    In the last 4 days I'm having a random cough.   No congestion at all.   It's triggered by swallowing wrong and I'll start coughing.   Is this from the intubation?   Maybe infection?  It's painful.    I called the surgeon yesterday.   They told me to call my PCP.   The cough is so random and swallowing triggers it.    2. SEVERITY: "How bad is the pain?" (Scale 1-10; mild, moderate or severe)   - MILD (1-3):  doesn't interfere with eating or normal activities   - MODERATE (4-7): interferes with eating some solids and normal activities   - SEVERE (8-10):  excruciating pain, interferes with most normal activities   - SEVERE DYSPHAGIA: can't swallow liquids, drooling     Mild 3. SORES: "Are there any sores or ulcers in the mouth?" If Yes, ask: "What part of the mouth are the sores in?"     I do have cancor sore.   It's in the back of my mouth.   It's trying to resolve.  It feels like it's in my jaw bone.   4. FEVER: "Do you have a fever?" If Yes, ask: "What is your temperature, how was it measured, and when did it start?"     No fever 5. CAUSE: "What do you think is causing the mouth pain?"     Maybe from the intubation.    6. OTHER SYMPTOMS: "Do you have any other symptoms?" (e.g., difficulty breathing)     No trouble swallowing but randomly it will trigger a cough when I swallow.    No drainage at all.   No runny nose.  Protocols used: Mouth Pain-A-AH

## 2020-10-12 ENCOUNTER — Encounter: Payer: Self-pay | Admitting: Family Medicine

## 2020-10-12 ENCOUNTER — Other Ambulatory Visit: Payer: Self-pay

## 2020-10-12 ENCOUNTER — Ambulatory Visit (INDEPENDENT_AMBULATORY_CARE_PROVIDER_SITE_OTHER): Payer: Medicare HMO | Admitting: Family Medicine

## 2020-10-12 VITALS — BP 138/88 | HR 77 | Temp 98.2°F | Resp 16 | Ht 64.0 in | Wt 192.0 lb

## 2020-10-12 DIAGNOSIS — R058 Other specified cough: Secondary | ICD-10-CM | POA: Diagnosis not present

## 2020-10-12 DIAGNOSIS — K1370 Unspecified lesions of oral mucosa: Secondary | ICD-10-CM

## 2020-10-12 DIAGNOSIS — K219 Gastro-esophageal reflux disease without esophagitis: Secondary | ICD-10-CM | POA: Diagnosis not present

## 2020-10-12 DIAGNOSIS — Z9889 Other specified postprocedural states: Secondary | ICD-10-CM | POA: Diagnosis not present

## 2020-10-12 DIAGNOSIS — G500 Trigeminal neuralgia: Secondary | ICD-10-CM | POA: Diagnosis not present

## 2020-10-12 MED ORDER — FAMOTIDINE 40 MG PO TABS: 40.0000 mg | ORAL_TABLET | Freq: Every day | ORAL | 0 refills | Status: DC

## 2020-10-12 MED ORDER — CARBAMAZEPINE ER 100 MG PO CP12: 100.0000 mg | ORAL_CAPSULE | Freq: Two times a day (BID) | ORAL | 0 refills | Status: DC

## 2020-10-12 MED ORDER — NYSTATIN 100000 UNIT/ML MT SUSP: 5.0000 mL | Freq: Four times a day (QID) | OROMUCOSAL | 0 refills | Status: DC

## 2020-10-12 NOTE — Patient Instructions (Addendum)
OOFOS - shoes  Trigeminal Neuralgia Trigeminal neuralgia is a nerve disorder that causes severe pain on one side of the face. The pain may last from a few seconds to several minutes. The pain is usually only on one side of the face. Symptoms may occur for days, weeks, or months and then go away for months or years. The pain may return and be worse than before. What are the causes? This condition is caused by damage or pressure to a nerve in the head that is called the trigeminal nerve. An attack can be triggered by: Talking. Chewing. Putting on makeup. Washing your face. Shaving your face. Brushing your teeth. Touching your face. What increases the risk? You are more likely to develop this condition if you: Are 73 years of age or older. Are female. What are the signs or symptoms? The main symptom of this condition is severe pain in the: Jaw. Lips. Eyes. Nose. Scalp. Forehead. Face. The pain may be: Intense. Stabbing. Electric. Shock-like. How is this diagnosed? This condition is diagnosed with a physical exam. A CT scan or an MRI may be done to rule out other conditions that can cause facial pain. How is this treated? This condition may be treated with: Avoiding the things that trigger your symptoms. Taking prescription medicines (anticonvulsants). Having surgery. This may be done in severe cases if other medical treatment does not provide relief. Having procedures such as ablation, thermal, or radiation therapy. It may take up to one month for treatment to start relieving the pain. Follow these instructions at home: Managing pain Learn as much as you can about how to manage your pain. Ask your health care provider if a pain specialist would be helpful. Consider talking with a mental health care provider (psychologist) about how to cope with the pain. Consider joining a pain support group. General instructions Take over-the-counter and prescription medicines only as  told by your health care provider. Avoid the things that trigger your symptoms. It may help to: Chew on the unaffected side of your mouth. Avoid touching your face. Avoid blasts of hot or cold air. Follow your treatment plan as told by your health care provider. This may include: Cognitive or behavioral therapy. Gentle, regular exercise. Meditation or yoga. Aromatherapy. Keep all follow-up visits as told by your health care provider. You may need to be monitored closely to make sure treatment is working well for you. Where to find more information Facial [employer] Association: fpa-support.org Contact a health care provider if: Your medicine is not helping your symptoms. You have side effects from the medicine used for treatment. You develop new, unexplained symptoms, such as: Double vision. Facial weakness. Facial numbness. Changes in hearing or balance. You feel depressed. Get help right away if: Your pain is severe and is not getting better. You develop suicidal thoughts. If you ever feel like you may hurt yourself or others, or have thoughts about taking your own life, get help right away. You can go to your nearest emergency department or call: Your local emergency services (911 in the [place] A suicide crisis helpline, such as the National Suicide Prevention Lifeline at 941-749-8722. This is open 24 hours a day. Summary Trigeminal neuralgia is a nerve disorder that causes severe pain on one side of the face. The pain may last from a few seconds to several minutes. This condition is caused by damage or pressure to a nerve in the head that is called the trigeminal nerve. Treatment may include avoiding the things  that trigger your symptoms, taking medicines, or having surgery or procedures. It may take up to one month for treatment to start relieving the pain. Avoid the things that trigger your symptoms. Keep all follow-up visits as told by your health care provider. You may need to be  monitored closely to make sure treatment is working well for you. This information is not intended to replace advice given to you by your health care provider. Make sure you discuss any questions you have with your health care provider. Document Revised: 11/30/2017 Document Reviewed: 11/30/2017 Elsevier Patient Education  2022 [employer].

## 2020-10-12 NOTE — Progress Notes (Signed)
Name: Kari Lee   MRN: [id number]    DOB: May 07, 1945   Date:10/12/2020       Progress Note  Subjective  Chief Complaint  Throat/Mouth Concern- After Surgery  HPI  Patient had rotator cuff repair done on  09/20/2020 by Dr. Joice Lofts. She is having PT and is doing well  She states over the past week  she has noticed intermittent dry cough, sometimes triggered by eating. She states it is getting gradually better. She denies heartburn or indigestion   Oral ulcer: going on for about one week, on left side of mouth  Left ear pain: start behind her left ear and radiates to pain inside her mouth, sometimes radiates to left cheek, sometimes radiates to left jaw also and other times radiates to temporal area.  She read about trigeminal neuralgia but she states pain is mild , described as dull or aching. She states pain seems to be aggravated by chewing and sometimes when drinking, only on left side.    Patient Active Problem List   Diagnosis Date Noted   Major depression in remission (HCC) 12/26/2019   Vitamin D deficiency 12/26/2019   DDD (degenerative disc disease), lumbar 12/26/2019   Lumbar stenosis 12/26/2019   Osteoarthritis 08/19/2016   Fatty liver disease, nonalcoholic 05/30/2016   Bradycardia 05/20/2016   Current long-term use of postmenopausal hormone replacement therapy 05/20/2016   Hyperlipidemia, unspecified 05/20/2016   Hypertension 05/20/2016   Rosacea 05/20/2016   History of hip replacement, total, 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    Past Surgical History:  Procedure Laterality Date   CERVICAL CONE BIOPSY     COLONOSCOPY  10/2010   COLONOSCOPY  2004   endoscopic carpal tunn Bilateral    HALLUX VALGUS CORRECTION Right 2005   HYSTERECTOMY ABDOMINAL WITH SALPINGECTOMY  1981   INCISION TENDON SHEATH HAND Right    JOINT REPLACEMENT Right 11/21/2010   knee   JOINT REPLACEMENT Left 11/04/2011   hip   JOINT REPLACEMENT Left 04/27/2012   knee   KNEE ARTHROSCOPY Right    KNEE  ARTHROSCOPY Left 12/18/2011   SHOULDER ARTHROSCOPY WITH SUBACROMIAL DECOMPRESSION, ROTATOR CUFF REPAIR AND BICEP TENDON REPAIR Left 09/20/2020   Procedure: SHOULDER ARTHROSCOPY WITH DEBRIDEMENT, DECOMPRESSION, ROTATOR CUFF REPAIR AND BICEPS TENODESIS;  Surgeon: Christena Flake, MD;  Location: [facility] ORS;  Service: Orthopedics;  Laterality: Left;    Family History  Problem Relation Age of Onset   Alzheimer's disease Mother 38   Osteoporosis Mother    Stroke Father 54   Arthritis Sister    Basal cell carcinoma Sister    Macular degeneration Other    Breast cancer Maternal Aunt 78    Social History   Tobacco Use   Smoking status: Never   Smokeless tobacco: Never  Substance Use Topics   Alcohol use: Yes    Alcohol/week: 1.0 standard drink    Types: 1 Glasses of wine per week     Current Outpatient Medications:    acetaminophen (TYLENOL) 500 MG tablet, Take 1 tablet (500 mg total) by mouth 2 (two) times daily. (Patient taking differently: Take 500-1,000 mg by mouth in the morning and at bedtime.), Disp: 60 tablet, Rfl: 0   Ascorbic Acid (VITAMIN C) 1000 MG tablet, Take 1,000 mg by mouth daily., Disp: , Rfl:    atorvastatin (LIPITOR) 40 MG tablet, Take 1 tablet (40 mg total) by mouth daily., Disp: 90 tablet, Rfl: 1   celecoxib (CELEBREX) 200 MG capsule, Take 1 capsule (200 mg total) by mouth 2 (  two) times daily., Disp: 90 capsule, Rfl: 1   estradiol (ESTRACE) 0.5 MG tablet, Take 1 tablet (0.5 mg total) by mouth daily., Disp: 90 tablet, Rfl: 1   gabapentin (NEURONTIN) 100 MG capsule, PLEASE SEE ATTACHED FOR DETAILED DIRECTIONS, Disp: , Rfl:    losartan (COZAAR) 50 MG tablet, Take 1 tablet (50 mg total) by mouth daily., Disp: 90 tablet, Rfl: 1   Multiple Vitamin (MULTIVITAMIN) tablet, Take 1 tablet by mouth daily., Disp: , Rfl:    oxyCODONE (ROXICODONE) 5 MG immediate release tablet, Take 1-2 tablets (5-10 mg total) by mouth every 4 (four) hours as needed., Disp: 40 tablet, Rfl: 0   Vitamin  D, Cholecalciferol, 50 MCG (2000 UT) CAPS, Take 2,000 Units by mouth daily., Disp: , Rfl:   Allergies  Allergen Reactions   Sulfa Antibiotics Nausea Only   Penicillins Rash    TOLERATED CEFAZOLIN    I personally reviewed active problem list, medication list, allergies, family history, social history, health maintenance with the patient/caregiver today.   ROS  Ten systems reviewed and is negative except as mentioned in HPI   Objective  Vitals:   10/12/20 0853  BP: 138/88  Pulse: 77  Resp: 16  Temp: 98.2 F (36.8 C)  SpO2: 99%  Weight: 192 lb (87.1 kg)  Height: 5\' 4"  (1.626 m)    Body mass index is 32.96 kg/m.  Physical Exam  Constitutional: Patient appears well-developed and well-nourished. Obese  No distress.  HEENT: head atraumatic, normocephalic, pupils equal and reactive to light, ears normal TM bilaterally, neck supple, throat within normal limits, she has a white plaque about 1 cm below last molar on left side ( towards oral mucosa) Cardiovascular: Normal rate, regular rhythm and normal heart sounds.  No murmur heard. No BLE edema. Pulmonary/Chest: Effort normal and breath sounds normal. No respiratory distress. Abdominal: Soft.  There is no tenderness. Muscular skeletal: wearing a brace on left arms/recent shoulder surgery Psychiatric: Patient has a normal mood and affect. behavior is normal. Judgment and thought content normal.    PHQ2/9: Depression screen [facility] 2/9 10/12/2020 06/26/2020 06/12/2020 12/26/2019 06/21/2019  Decreased Interest 0 0 0 0 0  Down, Depressed, Hopeless 0 0 0 0 0  PHQ - 2 Score 0 0 0 0 0  Altered sleeping 0 0 - 0 0  Tired, decreased energy 0 0 - 1 0  Change in appetite 0 0 - 1 0  Feeling bad or failure about yourself  0 0 - 0 0  Trouble concentrating 0 0 - 0 0  Moving slowly or fidgety/restless 0 0 - 0 0  Suicidal thoughts 0 0 - 0 0  PHQ-9 Score 0 0 - 2 0  Difficult doing work/chores - - - Not difficult at all Not difficult at all   Some recent data might be hidden    phq 9 is negative   Fall Risk: Fall Risk  10/12/2020 06/26/2020 06/12/2020 12/26/2019 06/21/2019  Falls in the past year? 0 0 0 1 0  Number falls in past yr: 0 0 0 1 0  Comment - - - - -  Injury with Fall? 0 0 0 0 0  Comment - - - - -  Risk for fall due to : No Fall Risks - No Fall Risks - -  Follow up Falls prevention discussed - Falls prevention discussed - -      Functional Status Survey: Is the patient deaf or have difficulty hearing?: No Does the patient have difficulty seeing,  even when wearing glasses/contacts?: No Does the patient have difficulty concentrating, remembering, or making decisions?: No Does the patient have difficulty walking or climbing stairs?: No Does the patient have difficulty dressing or bathing?: No Does the patient have difficulty doing errands alone such as visiting a doctor's office or shopping?: No    Assessment & Plan  1. Oral mucosal lesion  - nystatin (MYCOSTATIN) 100000 UNIT/ML suspension; Take 5 mLs (500,000 Units total) by mouth 4 (four) times daily.  Dispense: 60 mL; Refill: 0  Explained if no resolution she will need to see ENT for biopsy   2. Trigeminal neuralgia of left side of face  - carbamazepine (CARBATROL) 100 MG 12 hr capsule; Take 1 capsule (100 mg total) by mouth 2 (two) times daily.  Dispense: 60 capsule; Refill: 0  3. Dry cough  - famotidine (PEPCID) 40 MG tablet; Take 1 tablet (40 mg total) by mouth daily.  Dispense: 30 tablet; Refill: 0  4. GERD without esophagitis  - famotidine (PEPCID) 40 MG tablet; Take 1 tablet (40 mg total) by mouth daily.  Dispense: 30 tablet; Refill: 0

## 2020-10-12 NOTE — Telephone Encounter (Signed)
Visit today w/Dr. Carlynn Purl

## 2020-10-16 ENCOUNTER — Other Ambulatory Visit: Payer: Self-pay | Admitting: Family Medicine

## 2020-10-16 DIAGNOSIS — M19042 Primary osteoarthritis, left hand: Secondary | ICD-10-CM

## 2020-10-16 DIAGNOSIS — M19041 Primary osteoarthritis, right hand: Secondary | ICD-10-CM

## 2020-10-19 DIAGNOSIS — M25612 Stiffness of left shoulder, not elsewhere classified: Secondary | ICD-10-CM | POA: Diagnosis not present

## 2020-10-19 DIAGNOSIS — M25512 Pain in left shoulder: Secondary | ICD-10-CM | POA: Diagnosis not present

## 2020-10-19 DIAGNOSIS — G8929 Other chronic pain: Secondary | ICD-10-CM | POA: Diagnosis not present

## 2020-10-19 DIAGNOSIS — Z9889 Other specified postprocedural states: Secondary | ICD-10-CM | POA: Diagnosis not present

## 2020-10-19 DIAGNOSIS — M6281 Muscle weakness (generalized): Secondary | ICD-10-CM | POA: Diagnosis not present

## 2020-10-24 DIAGNOSIS — Z9889 Other specified postprocedural states: Secondary | ICD-10-CM | POA: Diagnosis not present

## 2020-10-24 DIAGNOSIS — M25512 Pain in left shoulder: Secondary | ICD-10-CM | POA: Diagnosis not present

## 2020-10-29 ENCOUNTER — Encounter: Payer: Self-pay | Admitting: Family Medicine

## 2020-11-02 DIAGNOSIS — M6281 Muscle weakness (generalized): Secondary | ICD-10-CM | POA: Diagnosis not present

## 2020-11-02 DIAGNOSIS — G8929 Other chronic pain: Secondary | ICD-10-CM | POA: Diagnosis not present

## 2020-11-02 DIAGNOSIS — M25612 Stiffness of left shoulder, not elsewhere classified: Secondary | ICD-10-CM | POA: Diagnosis not present

## 2020-11-02 DIAGNOSIS — Z9889 Other specified postprocedural states: Secondary | ICD-10-CM | POA: Diagnosis not present

## 2020-11-02 DIAGNOSIS — M25512 Pain in left shoulder: Secondary | ICD-10-CM | POA: Diagnosis not present

## 2020-11-03 ENCOUNTER — Other Ambulatory Visit: Payer: Self-pay | Admitting: Family Medicine

## 2020-11-03 DIAGNOSIS — R058 Other specified cough: Secondary | ICD-10-CM

## 2020-11-03 DIAGNOSIS — K219 Gastro-esophageal reflux disease without esophagitis: Secondary | ICD-10-CM

## 2020-11-03 NOTE — Telephone Encounter (Signed)
Requested Prescriptions  Pending Prescriptions Disp Refills  . famotidine (PEPCID) 40 MG tablet [Pharmacy Med Name: FAMOTIDINE 40 MG TABLET] 30 tablet 0    Sig: TAKE 1 TABLET BY MOUTH EVERY DAY     Gastroenterology:  H2 Antagonists Passed - 11/03/2020  1:30 PM      Passed - Valid encounter within last 12 months    Recent Outpatient Visits          3 weeks ago Oral mucosal lesion   [facility] Alba Cory, MD   4 months ago Essential hypertension   [facility], Danna Hefty, MD   10 months ago Major depression in remission [facility])   [facility] Alba Cory, MD   1 year ago Essential hypertension   [facility] Alba Cory, MD   2 years ago Essential hypertension   [facility] Alba Cory, MD      Future Appointments            In 3 days Alba Cory, MD [facility]   In 1 month Alba Cory, MD [facility]   In 7 months  [facility]

## 2020-11-06 ENCOUNTER — Ambulatory Visit (INDEPENDENT_AMBULATORY_CARE_PROVIDER_SITE_OTHER): Payer: Medicare HMO | Admitting: Family Medicine

## 2020-11-06 ENCOUNTER — Other Ambulatory Visit: Payer: Self-pay

## 2020-11-06 ENCOUNTER — Encounter: Payer: Self-pay | Admitting: Family Medicine

## 2020-11-06 VITALS — BP 130/86 | HR 75 | Temp 97.9°F | Resp 16 | Ht 64.0 in | Wt 192.0 lb

## 2020-11-06 DIAGNOSIS — Z9889 Other specified postprocedural states: Secondary | ICD-10-CM | POA: Insufficient documentation

## 2020-11-06 DIAGNOSIS — G8929 Other chronic pain: Secondary | ICD-10-CM | POA: Diagnosis not present

## 2020-11-06 DIAGNOSIS — Z0000 Encounter for general adult medical examination without abnormal findings: Secondary | ICD-10-CM | POA: Diagnosis not present

## 2020-11-06 DIAGNOSIS — M6281 Muscle weakness (generalized): Secondary | ICD-10-CM | POA: Diagnosis not present

## 2020-11-06 DIAGNOSIS — Z23 Encounter for immunization: Secondary | ICD-10-CM

## 2020-11-06 DIAGNOSIS — M25512 Pain in left shoulder: Secondary | ICD-10-CM | POA: Diagnosis not present

## 2020-11-06 NOTE — Patient Instructions (Signed)
Preventive Care 40 Years and Older, Female Preventive care refers to lifestyle choices and visits with your health care provider that can promote health and wellness. This includes: A yearly physical exam. This is also called an annual wellness visit. Regular dental and eye exams. Immunizations. Screening for certain conditions. Healthy lifestyle choices, such as: Eating a healthy diet. Getting regular exercise. Not using drugs or products that contain nicotine and tobacco. Limiting alcohol use. What can I expect for my preventive care visit? Physical exam Your health care provider will check your: Height and weight. These may be used to calculate your BMI (body mass index). BMI is a measurement that tells if you are at a healthy weight. Heart rate and blood pressure. Body temperature. Skin for abnormal spots. Counseling Your health care provider may ask you questions about your: Past medical problems. Family's medical history. Alcohol, tobacco, and drug use. Emotional well-being. Home life and relationship well-being. Sexual activity. Diet, exercise, and sleep habits. History of falls. Memory and ability to understand (cognition). Work and work Statistician. Pregnancy and menstrual history. Access to firearms. What immunizations do I need? Vaccines are usually given at various ages, according to a schedule. Your health care provider will recommend vaccines for you based on your age, medical history, and lifestyle or other factors, such as travel or where you work. What tests do I need? Blood tests Lipid and cholesterol levels. These may be checked every 5 years, or more often depending on your overall health. Hepatitis C test. Hepatitis B test. Screening Lung cancer screening. You may have this screening every year starting at age 33 if you have a 30-pack-year history of smoking and currently smoke or have quit within the past 15 years. Colorectal cancer screening. All  adults should have this screening starting at age 73 and continuing until age 9. Your health care provider may recommend screening at age 31 if you are at increased risk. You will have tests every 1-10 years, depending on your results and the type of screening test. Diabetes screening. This is done by checking your blood sugar (glucose) after you have not eaten for a while (fasting). You may have this done every 1-3 years. Mammogram. This may be done every 1-2 years. Talk with your health care provider about how often you should have regular mammograms. Abdominal aortic aneurysm (AAA) screening. You may need this if you are a current or former smoker. BRCA-related cancer screening. This may be done if you have a family history of breast, ovarian, tubal, or peritoneal cancers. Other tests STD (sexually transmitted disease) testing, if you are at risk. Bone density scan. This is done to screen for osteoporosis. You may have this done starting at age 43. Talk with your health care provider about your test results, treatment options, and if necessary, the need for more tests. Follow these instructions at home: Eating and drinking  Eat a diet that includes fresh fruits and vegetables, whole grains, lean protein, and low-fat dairy products. Limit your intake of foods with high amounts of sugar, saturated fats, and salt. Take vitamin and mineral supplements as recommended by your health care provider. Do not drink alcohol if your health care provider tells you not to drink. If you drink alcohol: Limit how much you have to 0-1 drink a day. Be aware of how much alcohol is in your drink. In the [place] one drink equals one 12 oz bottle of beer (355 mL), one 5 oz glass of wine (148 mL), or one  1 oz glass of hard liquor (44 mL). Lifestyle Take daily care of your teeth and gums. Brush your teeth every morning and night with fluoride toothpaste. Floss one time each day. Stay active. Exercise for at least  30 minutes 5 or more days each week. Do not use any products that contain nicotine or tobacco, such as cigarettes, e-cigarettes, and chewing tobacco. If you need help quitting, ask your health care provider. Do not use drugs. If you are sexually active, practice safe sex. Use a condom or other form of protection in order to prevent STIs (sexually transmitted infections). Talk with your health care provider about taking a low-dose aspirin or statin. Find healthy ways to cope with stress, such as: Meditation, yoga, or listening to music. Journaling. Talking to a trusted person. Spending time with friends and family. Safety Always wear your seat belt while driving or riding in a vehicle. Do not drive: If you have been drinking alcohol. Do not ride with someone who has been drinking. When you are tired or distracted. While texting. Wear a helmet and other protective equipment during sports activities. If you have firearms in your house, make sure you follow all gun safety procedures. What's next? Visit your health care provider once a year for an annual wellness visit. Ask your health care provider how often you should have your eyes and teeth checked. Stay up to date on all vaccines. This information is not intended to replace advice given to you by your health care provider. Make sure you discuss any questions you have with your health care provider. Document Revised: 03/23/2020 Document Reviewed: 01/07/2018 Elsevier Patient Education  2022 [employer].

## 2020-11-06 NOTE — Progress Notes (Signed)
Name: Kari Lee   MRN: [id number]    DOB: [birth year]   Date:11/06/2020       Progress Note  Subjective  Chief Complaint  Annual Exam  HPI  Patient presents for annual CPE.  Diet: cooks at home, eats vegetables, salads, fruit  Exercise: discussed importance of regular physical activity 150 minutes per week    [place] Visit from 06/21/2019 in [facility]  AUDIT-C Score 2      Depression: Phq 9 is  negative Depression screen [facility] 2/9 11/06/2020 10/12/2020 06/26/2020 06/12/2020 12/26/2019  Decreased Interest 0 0 0 0 0  Down, Depressed, Hopeless 0 0 0 0 0  PHQ - 2 Score 0 0 0 0 0  Altered sleeping - 0 0 - 0  Tired, decreased energy - 0 0 - 1  Change in appetite - 0 0 - 1  Feeling bad or failure about yourself  - 0 0 - 0  Trouble concentrating - 0 0 - 0  Moving slowly or fidgety/restless - 0 0 - 0  Suicidal thoughts - 0 0 - 0  PHQ-9 Score - 0 0 - 2  Difficult doing work/chores - - - - Not difficult at all  Some recent data might be hidden   Hypertension: BP Readings from Last 3 Encounters:  11/06/20 130/86  10/12/20 138/88  09/20/20 140/79   Obesity: Wt Readings from Last 3 Encounters:  11/06/20 192 lb (87.1 kg)  10/12/20 192 lb (87.1 kg)  09/20/20 191 lb (86.6 kg)   BMI Readings from Last 3 Encounters:  11/06/20 32.96 kg/m  10/12/20 32.96 kg/m  09/20/20 32.79 kg/m     Vaccines:   Shingrix: not interested  COVID-19: had two and does not want the booster  Pneumonia: up to date  Flu: today   Hep C Screening: 05/30/16 STD testing and prevention (HIV/chl/gon/syphilis): N/A Intimate partner violence: negative Sexual History : no pain.  Menstrual History/LMP/Abnormal Bleeding: discussed post-menopausal bleeding and importance of letting me know if it happens . History of hysterectomy  Incontinence Symptoms: stress incontinence.   Breast cancer:  - Last Mammogram: Ordered 06/12/20 - she had shoulder surgery and is waiting to  improve her ROM  - BRCA gene screening: N/A  Osteoporosis: Discussed high calcium and vitamin D supplementation, weight bearing exercises  Cervical cancer screening: s/p hysterectomy   Skin cancer: Discussed monitoring for atypical lesions  Colorectal cancer: 10/28/10   Lung cancer: Low Dose CT Chest recommended if Age 89-80 years, 20 pack-year currently smoking OR have quit w/in 15years. Patient does not qualify.   ECG: 09/21/20  Advanced Care Planning: A voluntary discussion about advance care planning including the explanation and discussion of advance directives.  Discussed health care proxy and Living will, and the patient was able to identify a health care proxy as husband   Lipids: Lab Results  Component Value Date   CHOL 153 06/26/2020   CHOL 155 06/21/2019   CHOL 163 06/28/2018   Lab Results  Component Value Date   HDL 68 06/26/2020   HDL 62 06/21/2019   HDL 62 06/28/2018   Lab Results  Component Value Date   LDLCALC 59 06/26/2020   LDLCALC 72 06/21/2019   LDLCALC 68 06/28/2018   Lab Results  Component Value Date   TRIG 179 (H) 06/26/2020   TRIG 133 06/21/2019   TRIG 237 (H) 06/28/2018   Lab Results  Component Value Date   CHOLHDL 2.3 06/26/2020   CHOLHDL 2.5 06/21/2019  CHOLHDL 2.6 06/28/2018   No results found for: LDLDIRECT  Glucose: Glucose  Date Value Ref Range Status  05/20/2012 125 (H) 65 - 99 mg/dL Final  05/19/2012 93 65 - 99 mg/dL Final  05/11/2012 89 65 - 99 mg/dL Final   Glucose, Bld  Date Value Ref Range Status  06/26/2020 99 65 - 99 mg/dL Final    Comment:    .            Fasting reference interval .   06/21/2019 95 65 - 99 mg/dL Final    Comment:    .            Fasting reference interval .   06/28/2018 85 65 - 99 mg/dL Final    Comment:    .            Fasting reference interval .     Patient Active Problem List   Diagnosis Date Noted   Major depression in remission ([place]) 12/26/2019   Vitamin D deficiency  12/26/2019   DDD (degenerative disc disease), lumbar 12/26/2019   Lumbar stenosis 12/26/2019   Osteoarthritis 08/19/2016   Fatty liver disease, nonalcoholic 66/29/4765   Bradycardia 05/20/2016   Current long-term use of postmenopausal hormone replacement therapy 05/20/2016   Hyperlipidemia, unspecified 05/20/2016   Hypertension 05/20/2016   Rosacea 05/20/2016   History of hip replacement, total, 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    Past Surgical History:  Procedure Laterality Date   CERVICAL CONE BIOPSY     COLONOSCOPY  10/2010   COLONOSCOPY  2004   endoscopic carpal tunn Bilateral    HALLUX VALGUS CORRECTION Right 2005   HYSTERECTOMY ABDOMINAL WITH SALPINGECTOMY  1981   INCISION TENDON SHEATH HAND Right    JOINT REPLACEMENT Right 11/21/2010   knee   JOINT REPLACEMENT Left 11/04/2011   hip   JOINT REPLACEMENT Left 04/27/2012   knee   KNEE ARTHROSCOPY Right    KNEE ARTHROSCOPY Left 12/18/2011   SHOULDER ARTHROSCOPY WITH SUBACROMIAL DECOMPRESSION, ROTATOR CUFF REPAIR AND BICEP TENDON REPAIR Left 09/20/2020   Procedure: SHOULDER ARTHROSCOPY WITH DEBRIDEMENT, DECOMPRESSION, ROTATOR CUFF REPAIR AND BICEPS TENODESIS;  Surgeon: Corky Mull, MD;  Location: [facility] ORS;  Service: Orthopedics;  Laterality: Left;    Family History  Problem Relation Age of Onset   Alzheimer's disease Mother 38   Osteoporosis Mother    Stroke Father 64   Arthritis Sister    Basal cell carcinoma Sister    Macular degeneration Other    Breast cancer Maternal Aunt 80    Social History   Socioeconomic History   Marital status: Married    Spouse name: Marcello Moores   Number of children: 2   Years of education: Not on file   Highest education level: Associate degree: occupational, Hotel manager, or vocational program  Occupational History   Occupation: Retired    Comment: Marine scientist  Tobacco Use   Smoking status: Never   Smokeless tobacco: Never  Vaping Use   Vaping Use: Never used  Substance and Sexual Activity   Alcohol  use: Yes    Alcohol/week: 1.0 standard drink    Types: 1 Glasses of wine per week   Drug use: No   Sexual activity: Yes    Partners: Male    Birth control/protection: None, Surgical, Post-menopausal  Other Topics Concern   Not on file  Social History Narrative   She moved to [place] to downsize, but decided to move back to [place] after 2 years.    Married.  One daughter lives in [place].    The other daughter in [place].   Social Determinants of Health   Financial Resource Strain: Low Risk    Difficulty of Paying Living Expenses: Not hard at all  Food Insecurity: No Food Insecurity   Worried About Charity fundraiser in the Last Year: Never true   [place] in the Last Year: Never true  Transportation Needs: No Transportation Needs   Lack of Transportation (Medical): No   Lack of Transportation (Non-Medical): No  Physical Activity: Inactive   Days of Exercise per Week: 0 days   Minutes of Exercise per Session: 0 min  Stress: No Stress Concern Present   Feeling of Stress : Not at all  Social Connections: Unknown   Frequency of Communication with Friends and Family: More than three times a week   Frequency of Social Gatherings with Friends and Family: Patient refused   Attends Religious Services: Patient refused   Marine scientist or Organizations: Patient refused   Attends Music therapist: Patient refused   Marital Status: Married  Human resources officer Violence: Not At Risk   Fear of Current or Ex-Partner: No   Emotionally Abused: No   Physically Abused: No   Sexually Abused: No     Current Outpatient Medications:    acetaminophen (TYLENOL) 500 MG tablet, Take 1 tablet (500 mg total) by mouth 2 (two) times daily. (Patient taking differently: Take 500-1,000 mg by mouth in the morning and at bedtime.), Disp: 60 tablet, Rfl: 0   Ascorbic Acid (VITAMIN C) 1000 MG tablet, Take 1,000 mg by mouth daily., Disp: , Rfl:    atorvastatin (LIPITOR) 40  MG tablet, Take 1 tablet (40 mg total) by mouth daily., Disp: 90 tablet, Rfl: 1   celecoxib (CELEBREX) 200 MG capsule, TAKE ONE CAPSULE BY MOUTH TWICE A DAY, Disp: 90 capsule, Rfl: 1   estradiol (ESTRACE) 0.5 MG tablet, Take 1 tablet (0.5 mg total) by mouth daily., Disp: 90 tablet, Rfl: 1   famotidine (PEPCID) 40 MG tablet, TAKE 1 TABLET BY MOUTH EVERY DAY, Disp: 30 tablet, Rfl: 0   gabapentin (NEURONTIN) 100 MG capsule, PLEASE SEE ATTACHED FOR DETAILED DIRECTIONS, Disp: , Rfl:    losartan (COZAAR) 50 MG tablet, Take 1 tablet (50 mg total) by mouth daily., Disp: 90 tablet, Rfl: 1   Multiple Vitamin (MULTIVITAMIN) tablet, Take 1 tablet by mouth daily., Disp: , Rfl:    Vitamin D, Cholecalciferol, 50 MCG (2000 UT) CAPS, Take 2,000 Units by mouth daily., Disp: , Rfl:   Allergies  Allergen Reactions   Sulfa Antibiotics Nausea Only   Penicillins Rash    TOLERATED CEFAZOLIN     ROS  Constitutional: Negative for fever or weight change.  Respiratory: Negative for cough and shortness of breath.   Cardiovascular: Negative for chest pain or palpitations.  Gastrointestinal: Negative for abdominal pain, no bowel changes.  Musculoskeletal: Negative for gait problem or joint swelling.  Skin: Negative for rash.  Neurological: Negative for dizziness or headache.  No other specific complaints in a complete review of systems (except as listed in HPI above).   Objective  Vitals:   11/06/20 0817  BP: 130/86  Pulse: 75  Resp: 16  Temp: 97.9 F (36.6 C)  SpO2: 96%  Weight: 192 lb (87.1 kg)  Height: _0  (1.626 m)    Body mass index is 32.96 kg/m.  Physical Exam  Constitutional: Patient appears well-developed and well-nourished. No distress.  HENT:  Head: Normocephalic and atraumatic. Ears: B TMs ok, no erythema or effusion; Nose: Nose normal. Mouth/Throat: Oropharynx is clear and moist. No oropharyngeal exudate.  Eyes: Conjunctivae and EOM are normal. Pupils are equal, round, and reactive to  light. No scleral icterus.  Neck: Normal range of motion. Neck supple. No JVD present. No thyromegaly present.  Cardiovascular: Normal rate, regular rhythm and normal heart sounds.  No murmur heard. No BLE edema. Pulmonary/Chest: Effort normal and breath sounds normal. No respiratory distress. Abdominal: Soft. Bowel sounds are normal, no distension. There is no tenderness. no masses Breast: no lumps or masses, no nipple discharge or rashes FEMALE GENITALIA:  Not done  Bimanual not done  Musculoskeletal: decrease rom of left shoulder, scar from recent surgery well healed  Neurological: he is alert and oriented to person, place, and time. No cranial nerve deficit. Coordination, balance, strength, speech and gait are normal.  Skin: Skin is warm and dry. No rash noted. No erythema.  Psychiatric: Patient has a normal mood and affect. behavior is normal. Judgment and thought content normal.    Fall Risk: Fall Risk  11/06/2020 10/12/2020 06/26/2020 06/12/2020 12/26/2019  Falls in the past year? 0 0 0 0 1  Number falls in past yr: 0 0 0 0 1  Comment - - - - -  Injury with Fall? 0 0 0 0 0  Comment - - - - -  Risk for fall due to : No Fall Risks No Fall Risks - No Fall Risks -  Follow up Falls prevention discussed Falls prevention discussed - Falls prevention discussed -     Functional Status Survey: Is the patient deaf or have difficulty hearing?: No Does the patient have difficulty seeing, even when wearing glasses/contacts?: No Does the patient have difficulty concentrating, remembering, or making decisions?: No Does the patient have difficulty walking or climbing stairs?: No Does the patient have difficulty dressing or bathing?: No Does the patient have difficulty doing errands alone such as visiting a doctor's office or shopping?: No   Assessment & Plan  1. Well adult exam   2. Need for immunization against influenza  - Flu Vaccine QUAD High Dose(Fluad)     -USPSTF grade A and  B recommendations reviewed with patient; age-appropriate recommendations, preventive care, screening tests, etc discussed and encouraged; healthy living encouraged; see AVS for patient education given to patient -Discussed importance of 150 minutes of physical activity weekly, eat two servings of fish weekly, eat one serving of tree nuts ( cashews, pistachios, pecans, almonds.Marland Kitchen) every other day, eat 6 servings of fruit/vegetables daily and drink plenty of water and avoid sweet beverages.

## 2020-11-13 DIAGNOSIS — Z9889 Other specified postprocedural states: Secondary | ICD-10-CM | POA: Diagnosis not present

## 2020-11-16 DIAGNOSIS — M25612 Stiffness of left shoulder, not elsewhere classified: Secondary | ICD-10-CM | POA: Diagnosis not present

## 2020-11-16 DIAGNOSIS — Z9889 Other specified postprocedural states: Secondary | ICD-10-CM | POA: Diagnosis not present

## 2020-11-16 DIAGNOSIS — M25512 Pain in left shoulder: Secondary | ICD-10-CM | POA: Diagnosis not present

## 2020-11-16 DIAGNOSIS — M6281 Muscle weakness (generalized): Secondary | ICD-10-CM | POA: Diagnosis not present

## 2020-11-16 DIAGNOSIS — G8929 Other chronic pain: Secondary | ICD-10-CM | POA: Diagnosis not present

## 2020-11-23 ENCOUNTER — Other Ambulatory Visit: Payer: Self-pay | Admitting: Family Medicine

## 2020-11-23 DIAGNOSIS — I10 Essential (primary) hypertension: Secondary | ICD-10-CM

## 2020-11-23 NOTE — Telephone Encounter (Signed)
Requested Prescriptions  Pending Prescriptions Disp Refills  . losartan (COZAAR) 50 MG tablet [Pharmacy Med Name: LOSARTAN POTASSIUM 50 MG TAB] 90 tablet 0    Sig: TAKE 1 TABLET BY MOUTH EVERY DAY     Cardiovascular:  Angiotensin Receptor Blockers Passed - 11/23/2020  1:27 AM      Passed - Cr in normal range and within 180 days    Creat  Date Value Ref Range Status  06/26/2020 0.69 0.60 - 0.93 mg/dL Final    Comment:    For patients >73 years of age, the reference limit for Creatinine is approximately 13% higher for people identified as African-American. .          Passed - K in normal range and within 180 days    Potassium  Date Value Ref Range Status  06/26/2020 5.0 3.5 - 5.3 mmol/L Final  05/20/2012 3.3 (L) 3.5 - 5.1 mmol/L Final         Passed - Patient is not pregnant      Passed - Last BP in normal range    BP Readings from Last 1 Encounters:  11/06/20 130/86         Passed - Valid encounter within last 6 months    Recent Outpatient Visits          2 weeks ago Well adult exam   [facility] Alba Cory, MD   1 month ago Oral mucosal lesion   [facility] Alba Cory, MD   5 months ago Essential hypertension   [facility], Danna Hefty, MD   11 months ago Major depression in remission [facility])   [facility] Alba Cory, MD   1 year ago Essential hypertension   [facility] Alba Cory, MD      Future Appointments            In 1 month Carlynn Purl, Danna Hefty, MD [facility]   In 6 months  [facility], [facility]

## 2020-11-26 DIAGNOSIS — G8929 Other chronic pain: Secondary | ICD-10-CM | POA: Diagnosis not present

## 2020-11-26 DIAGNOSIS — M25612 Stiffness of left shoulder, not elsewhere classified: Secondary | ICD-10-CM | POA: Diagnosis not present

## 2020-11-26 DIAGNOSIS — Z9889 Other specified postprocedural states: Secondary | ICD-10-CM | POA: Diagnosis not present

## 2020-11-26 DIAGNOSIS — M25512 Pain in left shoulder: Secondary | ICD-10-CM | POA: Diagnosis not present

## 2020-11-26 DIAGNOSIS — M6281 Muscle weakness (generalized): Secondary | ICD-10-CM | POA: Diagnosis not present

## 2020-11-30 DIAGNOSIS — M25612 Stiffness of left shoulder, not elsewhere classified: Secondary | ICD-10-CM | POA: Diagnosis not present

## 2020-11-30 DIAGNOSIS — M25512 Pain in left shoulder: Secondary | ICD-10-CM | POA: Diagnosis not present

## 2020-11-30 DIAGNOSIS — G8929 Other chronic pain: Secondary | ICD-10-CM | POA: Diagnosis not present

## 2020-11-30 DIAGNOSIS — M6281 Muscle weakness (generalized): Secondary | ICD-10-CM | POA: Diagnosis not present

## 2020-11-30 DIAGNOSIS — Z9889 Other specified postprocedural states: Secondary | ICD-10-CM | POA: Diagnosis not present

## 2020-12-03 DIAGNOSIS — M25612 Stiffness of left shoulder, not elsewhere classified: Secondary | ICD-10-CM | POA: Diagnosis not present

## 2020-12-03 DIAGNOSIS — Z9889 Other specified postprocedural states: Secondary | ICD-10-CM | POA: Diagnosis not present

## 2020-12-03 DIAGNOSIS — M25512 Pain in left shoulder: Secondary | ICD-10-CM | POA: Diagnosis not present

## 2020-12-03 DIAGNOSIS — G8929 Other chronic pain: Secondary | ICD-10-CM | POA: Diagnosis not present

## 2020-12-03 DIAGNOSIS — M6281 Muscle weakness (generalized): Secondary | ICD-10-CM | POA: Diagnosis not present

## 2020-12-07 ENCOUNTER — Other Ambulatory Visit: Payer: Self-pay | Admitting: Family Medicine

## 2020-12-07 DIAGNOSIS — R058 Other specified cough: Secondary | ICD-10-CM

## 2020-12-07 DIAGNOSIS — K219 Gastro-esophageal reflux disease without esophagitis: Secondary | ICD-10-CM

## 2020-12-15 ENCOUNTER — Other Ambulatory Visit: Payer: Self-pay | Admitting: Family Medicine

## 2020-12-15 DIAGNOSIS — E782 Mixed hyperlipidemia: Secondary | ICD-10-CM

## 2020-12-15 NOTE — Telephone Encounter (Signed)
Requested Prescriptions  Pending Prescriptions Disp Refills  . atorvastatin (LIPITOR) 40 MG tablet [Pharmacy Med Name: ATORVASTATIN 40 MG TABLET] 90 tablet 1    Sig: TAKE 1 TABLET BY MOUTH EVERY DAY     Cardiovascular:  Antilipid - Statins Failed - 12/15/2020 10:35 AM      Failed - Triglycerides in normal range and within 360 days    Triglycerides  Date Value Ref Range Status  06/26/2020 179 (H) <150 mg/dL Final         Passed - Total Cholesterol in normal range and within 360 days    Cholesterol  Date Value Ref Range Status  06/26/2020 153 <200 mg/dL Final         Passed - LDL in normal range and within 360 days    LDL Cholesterol (Calc)  Date Value Ref Range Status  06/26/2020 59 mg/dL (calc) Final    Comment:    Reference range: <100 . Desirable range <100 mg/dL for primary prevention;   <70 mg/dL for patients with CHD or diabetic patients  with > or = 2 CHD risk factors. Marland Kitchen LDL-C is now calculated using the Martin-Hopkins  calculation, which is a validated novel method providing  better accuracy than the Friedewald equation in the  estimation of LDL-C.  Horald Pollen et al. Lenox Ahr. 8295;621(30): 2061-2068  (http://education.[facility].com/faq/FAQ164)          Passed - HDL in normal range and within 360 days    HDL  Date Value Ref Range Status  06/26/2020 68 > OR = 50 mg/dL Final         Passed - Patient is not pregnant      Passed - Valid encounter within last 12 months    Recent Outpatient Visits          1 month ago Well adult exam   [facility] Alba Cory, MD   2 months ago Oral mucosal lesion   [facility] Alba Cory, MD   5 months ago Essential hypertension   [facility], Danna Hefty, MD   11 months ago Major depression in remission [facility])   [facility] Alba Cory, MD   1 year ago Essential hypertension   [facility] Alba Cory,  MD      Future Appointments            In 1 week Alba Cory, MD [facility], [facility]   In 6 months  [facility], [facility]

## 2020-12-21 ENCOUNTER — Other Ambulatory Visit: Payer: Self-pay | Admitting: Family Medicine

## 2020-12-21 DIAGNOSIS — R058 Other specified cough: Secondary | ICD-10-CM

## 2020-12-21 DIAGNOSIS — K219 Gastro-esophageal reflux disease without esophagitis: Secondary | ICD-10-CM

## 2020-12-22 NOTE — Telephone Encounter (Signed)
last RF 12/07/20 #30 too soon

## 2020-12-25 NOTE — Progress Notes (Deleted)
Name: Kari Lee   MRN: [id number]    DOB: [birth year]   Date:12/25/2020       Progress Note  Subjective  Chief Complaint  Follow Up  HPI  HTN she is taking medication and denies side effects. She is taking  Losartan, she denies chest pain, palpitation of dizziness .   Bradycardia: she states her heart rate is always slow and seen by cardiologist and negative evaluation. She states usually in the 60's, today her vitals were taken as soon as she came in     Obesity: she joined [employer] 05/26/2017, she started at 200.2 lbs and she was down to 173 lbs but since COVID-19 it went up to 193 lbs, weight has been stable since, over the past year, advised her to try going down to around 180lbs but to focus on being healthy and not so much on a weight number    Hot Flashes: she saw Dr. Chauncey Cruel  she was noticing dryness in her face and also hot flashes, She tried to stop but it caused hot flashes , she asked me to refill her mediations today    Elevated liver enzymes: over 100's , seen by Dr. Lars Pinks, US showed fatty liver, changed diet and lost weight, liver enzymes normalized. Last labs was normal 05/2019 LDL 72. 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 She is taking naproxen twice daily, discussed risk of nsaids use , she e would like to try Celebrex again. She states she did not do well on Meloxicam in the past    Hyperlipidemia: she is on atorvastatin and is doing well at this time, she denies myalgia. Last level was at goal, triglycerides back to normal,we will recheck labs today    Major Depression: she has a long history of depression in the past had a lot of lack of motivation and felt hopeless, but currently not having those problems. 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 Rosita Kea and referred to Dr. Yves Dill, and had PT and two steroid injection, she states pain going down her legs have resolved  IMPRESSION: Transitional lumbosacral anatomy. Please see numbering scheme above and correlate with plain films if any intervention is planned.   Disc bulge to the left at L2-3 causes narrowing in the left subarticular recess which could impact the left L3 root. There is also mild left foraminal narrowing at this level.   Disc and endplate spur to the right at L3-4 cause narrowing in the right subarticular recess and could impact the descending right L4 root. Moderate to moderately severe foraminal narrowing at L3-4 is worse on the left.  Left shoulder pain: she states pain started about 10 days ago , not injury related, started gradually and got progressively worse but not as severe today , pain is on lateral shoulder and goes down to her arm, she states it can be 3-10/10, currently it is a 3. Worse at night and is affecting her sleep. She has noticed pain when abduction above 90 degrees. , she denies radiation to her neck   Patient Active Problem List   Diagnosis Date Noted   S/P left rotator cuff repair 11/06/2020   Major depression  in remission ([facility]) 12/26/2019   Vitamin D deficiency 12/26/2019   DDD (degenerative disc disease), lumbar 12/26/2019   Lumbar stenosis 12/26/2019   Osteoarthritis 08/19/2016   Fatty liver disease, nonalcoholic 05/30/2016   Bradycardia 05/20/2016   Current long-term use of postmenopausal hormone replacement therapy 05/20/2016   Hyperlipidemia, unspecified 05/20/2016   Hypertension 05/20/2016   Rosacea 05/20/2016   History of hip replacement, total, 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    Past Surgical History:  Procedure Laterality Date   CERVICAL CONE BIOPSY     COLONOSCOPY  10/2010   COLONOSCOPY  2004   endoscopic carpal tunn Bilateral    HALLUX VALGUS CORRECTION Right 2005    HYSTERECTOMY ABDOMINAL WITH SALPINGECTOMY  1981   INCISION TENDON SHEATH HAND Right    JOINT REPLACEMENT Right 11/21/2010   knee   JOINT REPLACEMENT Left 11/04/2011   hip   JOINT REPLACEMENT Left 04/27/2012   knee   KNEE ARTHROSCOPY Right    KNEE ARTHROSCOPY Left 12/18/2011   SHOULDER ARTHROSCOPY WITH SUBACROMIAL DECOMPRESSION, ROTATOR CUFF REPAIR AND BICEP TENDON REPAIR Left 09/20/2020   Procedure: SHOULDER ARTHROSCOPY WITH DEBRIDEMENT, DECOMPRESSION, ROTATOR CUFF REPAIR AND BICEPS TENODESIS;  Surgeon: Christena Flake, MD;  Location: [facility] ORS;  Service: Orthopedics;  Laterality: Left;    Family History  Problem Relation Age of Onset   Alzheimer's disease Mother 33   Osteoporosis Mother    Stroke Father 71   Arthritis Sister    Basal cell carcinoma Sister    Breast cancer Maternal Aunt 62   Macular degeneration Other     Social History   Tobacco Use   Smoking status: Never   Smokeless tobacco: Never  Substance Use Topics   Alcohol use: Yes    Alcohol/week: 1.0 standard drink    Types: 1 Glasses of wine per week     Current Outpatient Medications:    acetaminophen (TYLENOL) 500 MG tablet, Take 1 tablet (500 mg total) by mouth 2 (two) times daily. (Patient taking differently: Take 500-1,000 mg by mouth in the morning and at bedtime.), Disp: 60 tablet, Rfl: 0   Ascorbic Acid (VITAMIN C) 1000 MG tablet, Take 1,000 mg by mouth daily., Disp: , Rfl:    atorvastatin (LIPITOR) 40 MG tablet, TAKE 1 TABLET BY MOUTH EVERY DAY, Disp: 90 tablet, Rfl: 1   celecoxib (CELEBREX) 200 MG capsule, TAKE ONE CAPSULE BY MOUTH TWICE A DAY, Disp: 90 capsule, Rfl: 1   estradiol (ESTRACE) 0.5 MG tablet, Take 1 tablet (0.5 mg total) by mouth daily., Disp: 90 tablet, Rfl: 1   famotidine (PEPCID) 40 MG tablet, TAKE 1 TABLET BY MOUTH EVERY DAY, Disp: 30 tablet, Rfl: 0   gabapentin (NEURONTIN) 100 MG capsule, PLEASE SEE ATTACHED FOR DETAILED DIRECTIONS, Disp: , Rfl:    losartan (COZAAR) 50 MG tablet, TAKE 1  TABLET BY MOUTH EVERY DAY, Disp: 90 tablet, Rfl: 0   Multiple Vitamin (MULTIVITAMIN) tablet, Take 1 tablet by mouth daily., Disp: , Rfl:    Vitamin D, Cholecalciferol, 50 MCG (2000 UT) CAPS, Take 2,000 Units by mouth daily., Disp: , Rfl:   Allergies  Allergen Reactions   Sulfa Antibiotics Nausea Only   Penicillins Rash    TOLERATED CEFAZOLIN    I personally reviewed active problem list, medication list, allergies, family history, social history, health maintenance with the patient/caregiver today.   ROS  ***  Objective  There were no vitals filed for this visit.  There is no height or weight on file to calculate BMI.  Physical  Exam ***  No results found for this or any previous visit (from the past 2160 hour(s)).   PHQ2/9: Depression screen [facility] 2/9 11/06/2020 10/12/2020 06/26/2020 06/12/2020 12/26/2019  Decreased Interest 0 0 0 0 0  Down, Depressed, Hopeless 0 0 0 0 0  PHQ - 2 Score 0 0 0 0 0  Altered sleeping - 0 0 - 0  Tired, decreased energy - 0 0 - 1  Change in appetite - 0 0 - 1  Feeling bad or failure about yourself  - 0 0 - 0  Trouble concentrating - 0 0 - 0  Moving slowly or fidgety/restless - 0 0 - 0  Suicidal thoughts - 0 0 - 0  PHQ-9 Score - 0 0 - 2  Difficult doing work/chores - - - - Not difficult at all  Some recent data might be hidden    phq 9 is {gen pos [id number]}   Fall Risk: Fall Risk  11/06/2020 10/12/2020 06/26/2020 06/12/2020 12/26/2019  Falls in the past year? 0 0 0 0 1  Number falls in past yr: 0 0 0 0 1  Comment - - - - -  Injury with Fall? 0 0 0 0 0  Comment - - - - -  Risk for fall due to : No Fall Risks No Fall Risks - No Fall Risks -  Follow up Falls prevention discussed Falls prevention discussed - Falls prevention discussed -      Functional Status Survey:      Assessment & Plan  *** There are no diagnoses linked to this encounter.

## 2020-12-26 ENCOUNTER — Ambulatory Visit: Payer: Medicare HMO | Admitting: Family Medicine

## 2021-01-22 ENCOUNTER — Other Ambulatory Visit: Payer: Self-pay | Admitting: Family Medicine

## 2021-01-22 DIAGNOSIS — R058 Other specified cough: Secondary | ICD-10-CM

## 2021-01-22 DIAGNOSIS — K219 Gastro-esophageal reflux disease without esophagitis: Secondary | ICD-10-CM

## 2021-01-25 ENCOUNTER — Other Ambulatory Visit: Payer: Self-pay | Admitting: Family Medicine

## 2021-01-25 DIAGNOSIS — M19042 Primary osteoarthritis, left hand: Secondary | ICD-10-CM

## 2021-02-18 ENCOUNTER — Other Ambulatory Visit: Payer: Self-pay | Admitting: Family Medicine

## 2021-02-18 DIAGNOSIS — K219 Gastro-esophageal reflux disease without esophagitis: Secondary | ICD-10-CM

## 2021-02-18 DIAGNOSIS — R058 Other specified cough: Secondary | ICD-10-CM

## 2021-02-22 DIAGNOSIS — M75122 Complete rotator cuff tear or rupture of left shoulder, not specified as traumatic: Secondary | ICD-10-CM | POA: Diagnosis not present

## 2021-02-22 DIAGNOSIS — M24112 Other articular cartilage disorders, left shoulder: Secondary | ICD-10-CM | POA: Diagnosis not present

## 2021-02-22 DIAGNOSIS — M7582 Other shoulder lesions, left shoulder: Secondary | ICD-10-CM | POA: Diagnosis not present

## 2021-02-22 DIAGNOSIS — M7522 Bicipital tendinitis, left shoulder: Secondary | ICD-10-CM | POA: Diagnosis not present

## 2021-03-22 DIAGNOSIS — B029 Zoster without complications: Secondary | ICD-10-CM | POA: Diagnosis not present

## 2021-03-23 ENCOUNTER — Other Ambulatory Visit: Payer: Self-pay | Admitting: Family Medicine

## 2021-03-23 DIAGNOSIS — M19041 Primary osteoarthritis, right hand: Secondary | ICD-10-CM

## 2021-04-04 ENCOUNTER — Other Ambulatory Visit: Payer: Self-pay | Admitting: Family Medicine

## 2021-04-04 DIAGNOSIS — R232 Flushing: Secondary | ICD-10-CM

## 2021-04-09 ENCOUNTER — Ambulatory Visit: Payer: Medicare HMO | Admitting: Family Medicine

## 2021-04-23 NOTE — Progress Notes (Signed)
Name: Kari Lee   MRN: [id number]    DOB: 26-Nov-1945   Date:04/24/2021 ? ?     Progress Note ? ?Subjective ? ?Chief Complaint ? ?Follow Up ? ?HPI ? ?HTN  She is taking  Losartan, she denies chest pain, palpitation of dizziness. Denies side effects of medication  ? ?Bradycardia: she states her heart rate is always slow and seen by cardiologist - [place]  but negative evaluation. She states usually in the 60's. Stable.  ?   ?Overweight: she joined [employer] 05/26/2017, she started at 200.2 lbs and she was down to 173 lbs but during the pandemic it went up to 193 lbs, she is back on the routine and lost weight again since the holidays. She follows the weight watchers app. She states she will increase physical activity. We had discussed keeping weigh around 180 lbs but she wants to go lower.  ?  ?Hot Flashes: she saw Dr. Chauncey Cruel  she was noticing dryness in her face and also hot flashes, She tried to stop but it caused hot flashes , she asked me to refill her mediations. Discussed risk but she is willing to continue medication  ?  ?Elevated liver enzymes: over 100's , seen by Dr. Lars Pinks, US showed fatty liver, changed diet and lost weight, liver enzymes normalized. We will recheck labs tagain today  ?  ?OA: most symptoms now on her hands, but has a history of bilateral  knee replacement and left hip, has DIP deformities but doing well on Celebrex 100 mg bid we will try changing to 200 mg once a day. ?  ?History of depression: patient states it was secondary to  moving and transitions in her life, she asked me to take diagnosis down. She does not recall symptoms since it was too long ago  ? ?Hyperlipidemia: she is on atorvastatin and is doing well at this time, she denies myalgia. Last level was at goal, triglycerides back to normal. We will recheck labs  ? 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 Rosita Kea and referred to Dr. Yves Dill, and had PT and two steroid injection, she  states pain going down her legs have resolved ? ?IMPRESSION: ?Transitional lumbosacral anatomy. Please see numbering scheme above ?and correlate with plain films if any intervention is planned. ?  ?Disc bulge to the left at L2-3 causes narrowing in the left ?subarticular recess which could impact the left L3 root. There is ?also mild left foraminal narrowing at this level. ?  ?Disc and endplate spur to the right at L3-4 cause narrowing in the ?right subarticular recess and could impact the descending right L4 ?root. Moderate to moderately severe foraminal narrowing at L3-4 is ?worse on the left. ? ? ?Patient Active Problem List  ? Diagnosis Date Noted  ? S/P left rotator cuff repair 11/06/2020  ? Vitamin D deficiency 12/26/2019  ? DDD (degenerative disc disease), lumbar 12/26/2019  ? Lumbar stenosis 12/26/2019  ? Osteoarthritis 08/19/2016  ? Fatty liver disease, nonalcoholic 05/30/2016  ? Bradycardia 05/20/2016  ? Current long-term use of postmenopausal hormone replacement therapy 05/20/2016  ? Hyperlipidemia, unspecified 05/20/2016  ? Hypertension 05/20/2016  ? Rosacea 05/20/2016  ? History of hip replacement, total, 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  ? ? ?Past Surgical History:  ?Procedure Laterality Date  ? CERVICAL CONE BIOPSY    ? COLONOSCOPY  10/2010  ? COLONOSCOPY  2004  ? endoscopic carpal tunn Bilateral   ? HALLUX VALGUS CORRECTION Right 2005  ?  HYSTERECTOMY ABDOMINAL WITH SALPINGECTOMY  1981  ? INCISION TENDON SHEATH HAND Right   ? JOINT REPLACEMENT Right 11/21/2010  ? knee  ? JOINT REPLACEMENT Left 11/04/2011  ? hip  ? JOINT REPLACEMENT Left 04/27/2012  ? knee  ? KNEE ARTHROSCOPY Right   ? KNEE ARTHROSCOPY Left 12/18/2011  ? SHOULDER ARTHROSCOPY WITH SUBACROMIAL DECOMPRESSION, ROTATOR CUFF REPAIR AND BICEP TENDON REPAIR Left 09/20/2020  ? Procedure: SHOULDER ARTHROSCOPY WITH DEBRIDEMENT, DECOMPRESSION, ROTATOR CUFF REPAIR AND BICEPS TENODESIS;  Surgeon: Christena Flake, MD;  Location: [facility] ORS;  Service: Orthopedics;   Laterality: Left;  ? ? ?Family History  ?Problem Relation Age of Onset  ? Alzheimer's disease Mother 79  ? Osteoporosis Mother   ? Stroke Father 69  ? Arthritis Sister   ? Basal cell carcinoma Sister   ? Breast cancer Maternal Aunt 80  ? Macular degeneration Other   ? ? ?Social History  ? ?Tobacco Use  ? Smoking status: Never  ? Smokeless tobacco: Never  ?Substance Use Topics  ? Alcohol use: Yes  ?  Alcohol/week: 1.0 standard drink  ?  Types: 1 Glasses of wine per week  ? ? ? ?Current Outpatient Medications:  ?  acetaminophen (TYLENOL) 500 MG tablet, Take 1 tablet (500 mg total) by mouth 2 (two) times daily., Disp: 60 tablet, Rfl: 0 ?  Ascorbic Acid (VITAMIN C) 1000 MG tablet, Take 1,000 mg by mouth daily., Disp: , Rfl:  ?  Multiple Vitamin (MULTIVITAMIN) tablet, Take 1 tablet by mouth daily., Disp: , Rfl:  ?  Vitamin D, Cholecalciferol, 50 MCG (2000 UT) CAPS, Take 2,000 Units by mouth daily., Disp: , Rfl:  ?  atorvastatin (LIPITOR) 40 MG tablet, Take 1 tablet (40 mg total) by mouth daily., Disp: 90 tablet, Rfl: 1 ?  celecoxib (CELEBREX) 200 MG capsule, Take 1 capsule (200 mg total) by mouth daily., Disp: 90 capsule, Rfl: 1 ?  estradiol (ESTRACE) 0.5 MG tablet, Take 1 tablet (0.5 mg total) by mouth daily., Disp: 90 tablet, Rfl: 3 ?  losartan (COZAAR) 50 MG tablet, Take 1 tablet (50 mg total) by mouth daily., Disp: 90 tablet, Rfl: 1 ? ?Allergies  ?Allergen Reactions  ? Sulfa Antibiotics Nausea Only  ? Penicillins Rash  ?  TOLERATED CEFAZOLIN  ? ? ?I personally reviewed active problem list, medication list, allergies, family history, social history, health maintenance with the patient/caregiver today. ? ? ?ROS ? ?Constitutional: Negative for fever or weight change.  ?Respiratory: Negative for cough and shortness of breath.   ?Cardiovascular: Negative for chest pain or palpitations.  ?Gastrointestinal: Negative for abdominal pain, no bowel changes.  ?Musculoskeletal: Negative for gait problem or joint swelling.  ?Skin:  Negative for rash.  ?Neurological: Negative for dizziness or headache.  ?No other specific complaints in a complete review of systems (except as listed in HPI above).  ? ?Objective ? ?Vitals:  ? 04/24/21 1436  ?BP: 132/74  ?Pulse: 80  ?Resp: 16  ?SpO2: 95%  ?Weight: 173 lb (78.5 kg)  ?Height: 5\' 4"  (1.626 m)  ? ? ?Body mass index is 29.7 kg/m?. ? ?Physical Exam ? ?Constitutional: Patient appears well-developed and well-nourished. Overweight.  No distress.  ?HEENT: head atraumatic, normocephalic, pupils equal and reactive to light, , neck supple ?Cardiovascular: Normal rate, regular rhythm and normal heart sounds.  No murmur heard. No BLE edema. ?Pulmonary/Chest: Effort normal and breath sounds normal. No respiratory distress. ?Abdominal: Soft.  There is no tenderness. ?Psychiatric: Patient has a normal mood and affect. behavior is  normal. Judgment and thought content normal.  ? ?PHQ2/9: ? ?  04/24/2021  ?  2:36 PM 11/06/2020  ?  8:16 AM 10/12/2020  ?  8:52 AM 06/26/2020  ?  8:35 AM 06/12/2020  ?  8:33 AM  ?Depression screen PHQ 2/9  ?Decreased Interest 0 0 0 0 0  ?Down, Depressed, Hopeless 0 0 0 0 0  ?PHQ - 2 Score 0 0 0 0 0  ?Altered sleeping 0  0 0   ?Tired, decreased energy 0  0 0   ?Change in appetite 0  0 0   ?Feeling bad or failure about yourself  0  0 0   ?Trouble concentrating 0  0 0   ?Moving slowly or fidgety/restless 0  0 0   ?Suicidal thoughts 0  0 0   ?PHQ-9 Score 0  0 0   ?  ?phq 9 is negative ? ? ?Fall Risk: ? ?  04/24/2021  ?  2:35 PM 11/06/2020  ?  8:16 AM 10/12/2020  ?  8:52 AM 06/26/2020  ?  8:35 AM 06/12/2020  ?  8:36 AM  ?Fall Risk   ?Falls in the past year? 0 0 0 0 0  ?Number falls in past yr: 0 0 0 0 0  ?Injury with Fall? 0 0 0 0 0  ?Risk for fall due to : No Fall Risks No Fall Risks No Fall Risks  No Fall Risks  ?Follow up Falls prevention discussed Falls prevention discussed Falls prevention discussed  Falls prevention discussed  ? ? ? ? ?Functional Status Survey: ?Is the patient deaf or have  difficulty hearing?: Yes ?Does the patient have difficulty seeing, even when wearing glasses/contacts?: No ?Does the patient have difficulty concentrating, remembering, or making decisions?: No ?Does the patient ha

## 2021-04-24 ENCOUNTER — Ambulatory Visit (INDEPENDENT_AMBULATORY_CARE_PROVIDER_SITE_OTHER): Payer: Medicare HMO | Admitting: Family Medicine

## 2021-04-24 ENCOUNTER — Encounter: Payer: Self-pay | Admitting: Family Medicine

## 2021-04-24 VITALS — BP 132/74 | HR 80 | Resp 16 | Ht 64.0 in | Wt 173.0 lb

## 2021-04-24 DIAGNOSIS — M159 Polyosteoarthritis, unspecified: Secondary | ICD-10-CM | POA: Diagnosis not present

## 2021-04-24 DIAGNOSIS — M48061 Spinal stenosis, lumbar region without neurogenic claudication: Secondary | ICD-10-CM | POA: Diagnosis not present

## 2021-04-24 DIAGNOSIS — K760 Fatty (change of) liver, not elsewhere classified: Secondary | ICD-10-CM | POA: Diagnosis not present

## 2021-04-24 DIAGNOSIS — R718 Other abnormality of red blood cells: Secondary | ICD-10-CM | POA: Diagnosis not present

## 2021-04-24 DIAGNOSIS — I10 Essential (primary) hypertension: Secondary | ICD-10-CM | POA: Diagnosis not present

## 2021-04-24 DIAGNOSIS — E782 Mixed hyperlipidemia: Secondary | ICD-10-CM

## 2021-04-24 DIAGNOSIS — R232 Flushing: Secondary | ICD-10-CM | POA: Diagnosis not present

## 2021-04-24 DIAGNOSIS — Z1211 Encounter for screening for malignant neoplasm of colon: Secondary | ICD-10-CM

## 2021-04-24 DIAGNOSIS — M19042 Primary osteoarthritis, left hand: Secondary | ICD-10-CM

## 2021-04-24 DIAGNOSIS — M19041 Primary osteoarthritis, right hand: Secondary | ICD-10-CM

## 2021-04-24 DIAGNOSIS — E559 Vitamin D deficiency, unspecified: Secondary | ICD-10-CM

## 2021-04-24 DIAGNOSIS — K219 Gastro-esophageal reflux disease without esophagitis: Secondary | ICD-10-CM

## 2021-04-24 DIAGNOSIS — Z8619 Personal history of other infectious and parasitic diseases: Secondary | ICD-10-CM

## 2021-04-24 DIAGNOSIS — Z23 Encounter for immunization: Secondary | ICD-10-CM

## 2021-04-24 MED ORDER — LOSARTAN POTASSIUM 50 MG PO TABS: 50.0000 mg | ORAL_TABLET | Freq: Every day | ORAL | 1 refills | Status: DC

## 2021-04-24 MED ORDER — ATORVASTATIN CALCIUM 40 MG PO TABS: 40.0000 mg | ORAL_TABLET | Freq: Every day | ORAL | 1 refills | Status: DC

## 2021-04-24 MED ORDER — ESTRADIOL 0.5 MG PO TABS: 0.5000 mg | ORAL_TABLET | Freq: Every day | ORAL | 3 refills | Status: DC

## 2021-04-24 MED ORDER — CELECOXIB 200 MG PO CAPS: 200.0000 mg | ORAL_CAPSULE | Freq: Every day | ORAL | 1 refills | Status: DC

## 2021-04-25 LAB — CBC WITH DIFFERENTIAL/PLATELET
Absolute Monocytes: 525 cells/uL (ref 200–950)
Basophils Absolute: 40 cells/uL (ref 0–200)
Basophils Relative: 0.8 %
Eosinophils Absolute: 60 cells/uL (ref 15–500)
Eosinophils Relative: 1.2 %
HCT: 44.9 % (ref 35.0–45.0)
Hemoglobin: 15.4 g/dL (ref 11.7–15.5)
Lymphs Abs: 1625 cells/uL (ref 850–3900)
MCH: 31.1 pg (ref 27.0–33.0)
MCHC: 34.3 g/dL (ref 32.0–36.0)
MCV: 90.7 fL (ref 80.0–100.0)
MPV: 11.6 fL (ref 7.5–12.5)
Monocytes Relative: 10.5 %
Neutro Abs: 2750 cells/uL (ref 1500–7800)
Neutrophils Relative %: 55 %
Platelets: 187 10*3/uL (ref 140–400)
RBC: 4.95 10*6/uL (ref 3.80–5.10)
RDW: 13.8 % (ref 11.0–15.0)
Total Lymphocyte: 32.5 %
WBC: 5 10*3/uL (ref 3.8–10.8)

## 2021-04-25 LAB — COMPLETE METABOLIC PANEL WITH GFR
AG Ratio: 1.8 (calc) (ref 1.0–2.5)
ALT: 25 U/L (ref 6–29)
AST: 25 U/L (ref 10–35)
Albumin: 4.5 g/dL (ref 3.6–5.1)
Alkaline phosphatase (APISO): 60 U/L (ref 37–153)
BUN: 14 mg/dL (ref 7–25)
CO2: 28 mmol/L (ref 20–32)
Calcium: 9.7 mg/dL (ref 8.6–10.4)
Chloride: 102 mmol/L (ref 98–110)
Creat: 0.74 mg/dL (ref 0.60–1.00)
Globulin: 2.5 g/dL (calc) (ref 1.9–3.7)
Glucose, Bld: 83 mg/dL (ref 65–99)
Potassium: 4.7 mmol/L (ref 3.5–5.3)
Sodium: 140 mmol/L (ref 135–146)
Total Bilirubin: 1.1 mg/dL (ref 0.2–1.2)
Total Protein: 7 g/dL (ref 6.1–8.1)
eGFR: 85 mL/min/{1.73_m2} (ref >=60)

## 2021-04-25 LAB — LIPID PANEL
Cholesterol: 150 mg/dL (ref <200)
HDL: 67 mg/dL (ref >=50)
LDL Cholesterol (Calc): 64 mg/dL (calc)
Non-HDL Cholesterol (Calc): 83 mg/dL (calc) (ref <130)
Total CHOL/HDL Ratio: 2.2 (calc) (ref <5.0)
Triglycerides: 103 mg/dL (ref <150)

## 2021-04-25 LAB — IRON,TIBC AND FERRITIN PANEL
%SAT: 27 % (calc) (ref 16–45)
Ferritin: 76 ng/mL (ref 16–288)
Iron: 96 ug/dL (ref 45–160)
TIBC: 355 mcg/dL (calc) (ref 250–450)

## 2021-04-25 LAB — VITAMIN D 25 HYDROXY (VIT D DEFICIENCY, FRACTURES): Vit D, 25-Hydroxy: 53 ng/mL (ref 30–100)

## 2021-05-02 DIAGNOSIS — H938X2 Other specified disorders of left ear: Secondary | ICD-10-CM | POA: Diagnosis not present

## 2021-05-02 DIAGNOSIS — H6122 Impacted cerumen, left ear: Secondary | ICD-10-CM | POA: Diagnosis not present

## 2021-05-02 DIAGNOSIS — H60501 Unspecified acute noninfective otitis externa, right ear: Secondary | ICD-10-CM | POA: Diagnosis not present

## 2021-05-02 DIAGNOSIS — H9201 Otalgia, right ear: Secondary | ICD-10-CM | POA: Diagnosis not present

## 2021-05-07 DIAGNOSIS — Z1211 Encounter for screening for malignant neoplasm of colon: Secondary | ICD-10-CM | POA: Diagnosis not present

## 2021-05-14 ENCOUNTER — Telehealth: Payer: Self-pay | Admitting: Family Medicine

## 2021-05-14 LAB — COLOGUARD: COLOGUARD: NEGATIVE

## 2021-05-14 NOTE — Telephone Encounter (Signed)
Patient returning call regarding message below.

## 2021-05-14 NOTE — Telephone Encounter (Signed)
Copied from [place] [id number]. Topic: General - Other ?>> May 14, 2021  4:31 PM Tessa Lerner A wrote: ?Reason for CRM: The patient would like to speak with a member of staff when possible  ? ?The patient was previously taking their celecoxib (CELEBREX) 200 MG capsule [id number] medication twice daily  ? ?The patient would like to continue taking the medication twice daily  ? ?The patient would like for the practice to notify their pharmacy that they would like to take the medication twice daily  ?[facility] [id number], [place] ?[place] [address] ?Phone: [phone] Fax: [phone] ?Hours: Not open 24 hours ? ?Please contact the patient to discuss their medication further when possible ?

## 2021-05-14 NOTE — Telephone Encounter (Signed)
Left VM to call back to discuss request. ?

## 2021-05-15 ENCOUNTER — Other Ambulatory Visit: Payer: Self-pay | Admitting: Family Medicine

## 2021-05-15 DIAGNOSIS — M19071 Primary osteoarthritis, right ankle and foot: Secondary | ICD-10-CM | POA: Diagnosis not present

## 2021-05-15 DIAGNOSIS — M79671 Pain in right foot: Secondary | ICD-10-CM | POA: Diagnosis not present

## 2021-05-15 DIAGNOSIS — M898X9 Other specified disorders of bone, unspecified site: Secondary | ICD-10-CM | POA: Diagnosis not present

## 2021-06-18 DIAGNOSIS — H35371 Puckering of macula, right eye: Secondary | ICD-10-CM | POA: Diagnosis not present

## 2021-06-18 DIAGNOSIS — H5213 Myopia, bilateral: Secondary | ICD-10-CM | POA: Diagnosis not present

## 2021-06-20 ENCOUNTER — Ambulatory Visit (INDEPENDENT_AMBULATORY_CARE_PROVIDER_SITE_OTHER): Payer: Medicare HMO

## 2021-06-20 VITALS — BP 138/78 | HR 54 | Temp 98.2°F | Resp 16 | Ht 64.0 in | Wt 173.3 lb

## 2021-06-20 DIAGNOSIS — Z0000 Encounter for general adult medical examination without abnormal findings: Secondary | ICD-10-CM | POA: Diagnosis not present

## 2021-06-20 DIAGNOSIS — Z780 Asymptomatic menopausal state: Secondary | ICD-10-CM

## 2021-06-20 DIAGNOSIS — Z1231 Encounter for screening mammogram for malignant neoplasm of breast: Secondary | ICD-10-CM

## 2021-06-20 NOTE — Progress Notes (Signed)
Subjective:   Kari Lee is a 76 y.o. female who presents for Medicare Annual (Subsequent) preventive examination.  Review of Systems     Cardiac Risk Factors include: advanced age (>14men, >33 women);dyslipidemia;hypertension     Objective:    Today's Vitals   06/20/21 0904 06/20/21 0919  BP: (!) 152/82 138/78  Pulse: (!) 54   Resp: 16   Temp: 98.2 F (36.8 C)   TempSrc: Oral   SpO2: 97%   Weight: 173 lb 4.8 oz (78.6 kg)   Height: 5\' 4"  (1.626 m)    Body mass index is 29.75 kg/m.     06/20/2021    9:11 AM 09/20/2020    9:30 AM 09/11/2020   11:06 AM 06/12/2020    8:34 AM 05/17/2019    8:49 AM 02/19/2018    9:24 AM 10/14/2017    5:38 PM  Advanced Directives  Does Patient Have a Medical Advance Directive? Yes No No No Yes Yes Yes  Type of Paramedic of [place];Living will    [place];Living will [place];Living will [place];Living will  Does patient want to make changes to medical advance directive?     Yes (MAU/Ambulatory/Procedural Areas - Information given)    Copy of [place] in Chart? No - copy requested    No - copy requested No - copy requested   Would patient like information on creating a medical advance directive?  No - Patient declined  Yes (MAU/Ambulatory/Procedural Areas - Information given)       Current Medications (verified) Outpatient Encounter Medications as of 06/20/2021  Medication Sig   acetaminophen (TYLENOL) 500 MG tablet Take 1 tablet (500 mg total) by mouth 2 (two) times daily.   Ascorbic Acid (VITAMIN C) 1000 MG tablet Take 1,000 mg by mouth daily.   atorvastatin (LIPITOR) 40 MG tablet Take 1 tablet (40 mg total) by mouth daily.   celecoxib (CELEBREX) 200 MG capsule Take 1 capsule (200 mg total) by mouth daily.   estradiol (ESTRACE) 0.5 MG tablet Take 1 tablet (0.5 mg total) by mouth daily.   losartan (COZAAR) 50 MG tablet Take 1 tablet  (50 mg total) by mouth daily.   Multiple Vitamin (MULTIVITAMIN) tablet Take 1 tablet by mouth daily.   Vitamin D, Cholecalciferol, 50 MCG (2000 UT) CAPS Take 2,000 Units by mouth daily.   No facility-administered encounter medications on file as of 06/20/2021.    Allergies (verified) Sulfa antibiotics and Penicillins   History: Past Medical History:  Diagnosis Date   Anxiety    Arthritis    Bradycardia    Bronchitis    Complication of anesthesia    woke up during hip surgery   Dysrhythmia    congenital bradycardia   GERD (gastroesophageal reflux disease)    History of positive PPD 2009   History of positive PPD 2009   INH through health dept CXR's needed for clearance   Hyperlipidemia    Hypertension    Insomnia    Lumbar spondylosis    Managed by Dr[facility] Hessie Knows [facility]   Obesity    Rosacea    Spinal stenosis    Spondylosis of cervical spine    Past Surgical History:  Procedure Laterality Date   CERVICAL CONE BIOPSY     COLONOSCOPY  10/2010   COLONOSCOPY  2004   endoscopic carpal tunn Bilateral    HALLUX VALGUS CORRECTION Right 2005   HYSTERECTOMY  ABDOMINAL WITH SALPINGECTOMY  1981   INCISION TENDON SHEATH HAND Right    JOINT REPLACEMENT Right 11/21/2010   knee   JOINT REPLACEMENT Left 11/04/2011   hip   JOINT REPLACEMENT Left 04/27/2012   knee   KNEE ARTHROSCOPY Right    KNEE ARTHROSCOPY Left 12/18/2011   SHOULDER ARTHROSCOPY WITH SUBACROMIAL DECOMPRESSION, ROTATOR CUFF REPAIR AND BICEP TENDON REPAIR Left 09/20/2020   Procedure: SHOULDER ARTHROSCOPY WITH DEBRIDEMENT, DECOMPRESSION, ROTATOR CUFF REPAIR AND BICEPS TENODESIS;  Surgeon: Corky Mull, MD;  Location: [facility] ORS;  Service: Orthopedics;  Laterality: Left;   Family History  Problem Relation Age of Onset   Alzheimer's disease Mother 40   Osteoporosis Mother    Stroke Father 64   Arthritis Sister    Basal cell carcinoma Sister    Breast cancer Maternal Aunt 80   Macular degeneration Other     Social History   Socioeconomic History   Marital status: Married    Spouse name: Marcello Moores   Number of children: 2   Years of education: Not on file   Highest education level: Associate degree: occupational, Hotel manager, or vocational program  Occupational History   Occupation: Retired    Comment: Nurse  Tobacco Use   Smoking status: Never   Smokeless tobacco: Never  Vaping Use   Vaping Use: Never used  Substance and Sexual Activity   Alcohol use: Yes    Alcohol/week: 1.0 standard drink    Types: 1 Glasses of wine per week   Drug use: No   Sexual activity: Yes    Partners: Male    Birth control/protection: None, Surgical, Post-menopausal  Other Topics Concern   Not on file  Social History Narrative   She moved to [place] to downsize, but decided to move back to [place] after 2 years.    Married.   One daughter lives in [place].    The other daughter in [place].   Social Determinants of Health   Financial Resource Strain: Low Risk    Difficulty of Paying Living Expenses: Not very hard  Food Insecurity: No Food Insecurity   Worried About Charity fundraiser in the Last Year: Never true   Ran Out of Food in the Last Year: Never true  Transportation Needs: No Transportation Needs   Lack of Transportation (Medical): No   Lack of Transportation (Non-Medical): No  Physical Activity: Inactive   Days of Exercise per Week: 0 days   Minutes of Exercise per Session: 0 min  Stress: No Stress Concern Present   Feeling of Stress : Not at all  Social Connections: Unknown   Frequency of Communication with Friends and Family: More than three times a week   Frequency of Social Gatherings with Friends and Family: Twice a week   Attends Religious Services: Patient refused   Marine scientist or Organizations: Patient refused   Attends Music therapist: Patient refused   Marital Status: Married    Tobacco Counseling Counseling given: Not  Answered   Clinical Intake:  Pre-visit preparation completed: Yes  Pain : No/denies pain     BMI - recorded: 29.75 Nutritional Status: BMI 25 -29 Overweight Nutritional Risks: None Diabetes: No  How often do you need to have someone help you when you read instructions, pamphlets, or other written materials from your doctor or pharmacy?: 1 - Never   Interpreter Needed?: No  Information entered by :: Clemetine Marker LPN   Activities of Daily Living    06/20/2021  9:13 AM 06/16/2021    5:09 PM  In your present state of health, do you have any difficulty performing the following activities:  Hearing? 1 0  Vision? 0 0  Difficulty concentrating or making decisions? 0 0  Walking or climbing stairs? 0 0  Dressing or bathing? 0 0  Doing errands, shopping? 0 0  Preparing Food and eating ? N N  Using the Toilet? N N  In the past six months, have you accidently leaked urine? Kari Lee  Comment wears pads for protection   Do you have problems with loss of bowel control? N N  Managing your Medications? N N  Managing your Finances? N N  Housekeeping or managing your Housekeeping? N N    Patient Care Team: Alba Cory, MD as PCP - General (Family Medicine)  Indicate any recent Medical Services you may have received from other than [facility] providers in the past year (date may be approximate).     Assessment:   This is a routine wellness examination for Kari Lee.  Hearing/Vision screen Hearing Screening - Comments:: Pt has mild hearing difficulty and has been evaluated but has not gotten hearing aids due to cost.  Vision Screening - Comments:: Annual vision screenings done by Dr. Frazier Butt in [facility]  Dietary issues and exercise activities discussed: Current Exercise Habits: The patient does not participate in regular exercise at present, Exercise limited by: orthopedic condition(s)   Goals Addressed   None    Depression Screen    06/20/2021    9:09 AM 04/24/2021    2:36 PM  11/06/2020    8:16 AM 10/12/2020    8:52 AM 06/26/2020    8:35 AM 06/12/2020    8:33 AM 12/26/2019    8:37 AM  PHQ 2/9 Scores  PHQ - 2 Score 0 0 0 0 0 0 0  PHQ- 9 Score  0  0 0  2    Fall Risk    06/20/2021    9:12 AM 06/16/2021    5:09 PM 04/24/2021    2:35 PM 11/06/2020    8:16 AM 10/12/2020    8:52 AM  Fall Risk   Falls in the past year? 0 0 0 0 0  Number falls in past yr: 0  0 0 0  Injury with Fall? 0  0 0 0  Risk for fall due to : No Fall Risks  No Fall Risks No Fall Risks No Fall Risks  Follow up Falls prevention discussed  Falls prevention discussed Falls prevention discussed Falls prevention discussed    FALL RISK PREVENTION PERTAINING TO THE HOME:  Any stairs in or around the home? Yes  If so, are there any without handrails? No  Home free of loose throw rugs in walkways, pet beds, electrical cords, etc? Yes  Adequate lighting in your home to reduce risk of falls? Yes   ASSISTIVE DEVICES UTILIZED TO PREVENT FALLS:  Life alert? No  Use of a cane, walker or w/c? No  Grab bars in the bathroom? Yes  Shower chair or bench in shower? Yes  Elevated toilet seat or a handicapped toilet? Yes   TIMED UP AND GO:  Was the test performed? Yes .  Length of time to ambulate 10 feet: 5 sec.   Gait steady and fast without use of assistive device  Cognitive Function: Normal cognitive status assessed by direct observation by this Nurse Health Advisor. No abnormalities found.          02/19/2018  9:38 AM  6CIT Screen  What Year? 0 points  What month? 0 points  What time? 0 points  Count back from 20 0 points  Months in reverse 0 points  Repeat phrase 2 points  Total Score 2 points    Immunizations Immunization History  Administered Date(s) Administered   Fluad Quad(high Dose 65+) 10/29/2018, 11/06/2020   Influenza, High Dose Seasonal PF 11/19/2016, 10/20/2017   Influenza,inj,Quad PF,6+ Mos 12/26/2019   Influenza-Unspecified 01/05/2015   PFIZER(Purple  Top)SARS-COV-2 Vaccination 03/09/2019, 03/30/2019   Pneumococcal Conjugate-13 07/03/2014   Pneumococcal Polysaccharide-23 11/03/2011, 11/19/2016   Td 08/13/2009   Tdap 12/26/2019   Zoster, Live 02/19/2012    TDAP status: Up to date  Flu Vaccine status: Up to date  Pneumococcal vaccine status: Up to date  Covid-19 vaccine status: Completed vaccines  Qualifies for Shingles Vaccine? Yes   Zostavax completed Yes   Shingrix Completed?: No.    Education has been provided regarding the importance of this vaccine. Patient has been advised to call insurance company to determine out of pocket expense if they have not yet received this vaccine. Advised may also receive vaccine at local pharmacy or Health Dept. Verbalized acceptance and understanding.  Screening Tests Health Maintenance  Topic Date Due   Zoster Vaccines- Shingrix (1 of 2) Never done   COVID-19 Vaccine (3 - Booster for Pfizer series) 05/25/2019   MAMMOGRAM  05/29/2020   INFLUENZA VACCINE  08/27/2021   Fecal DNA (Cologuard)  05/07/2024   TETANUS/TDAP  12/25/2029   Pneumonia Vaccine 28+ Years old  Completed   DEXA SCAN  Completed   Hepatitis C Screening  Completed   HPV VACCINES  Aged Out   COLONOSCOPY (Pts 45-52yrs Insurance coverage will need to be confirmed)  Discontinued    Health Maintenance  Health Maintenance Due  Topic Date Due   Zoster Vaccines- Shingrix (1 of 2) Never done   COVID-19 Vaccine (3 - Booster for Pfizer series) 05/25/2019   MAMMOGRAM  05/29/2020    Colorectal cancer screening: Type of screening: Cologuard. Completed 05/07/21. Repeat every 3 years  Mammogram status: Completed 05/30/19. Repeat every year. Ordered today.  Bone Density status: Completed 11/03/17. Results reflect: Bone density results: NORMAL. Repeat every 2 years. Ordered today.   Lung Cancer Screening: (Low Dose CT Chest recommended if Age 87-80 years, 30 pack-year currently smoking OR have quit w/in 15years.) does not qualify.    Additional Screening:  Hepatitis C Screening: does qualify; Completed 05/30/16  Vision Screening: Recommended annual ophthalmology exams for early detection of glaucoma and other disorders of the eye. Is the patient up to date with their annual eye exam?  Yes  Who is the provider or what is the name of the office in which the patient attends annual eye exams? Dr. Tempie Donning.   Dental Screening: Recommended annual dental exams for proper oral hygiene  Community Resource Referral / Chronic Care Management: CRR required this visit?  No   CCM required this visit?  No      Plan:     I have personally reviewed and noted the following in the patient's chart:   Medical and social history Use of alcohol, tobacco or illicit drugs  Current medications and supplements including opioid prescriptions.  Functional ability and status Nutritional status Physical activity Advanced directives List of other physicians Hospitalizations, surgeries, and ER visits in previous 12 months Vitals Screenings to include cognitive, depression, and falls Referrals and appointments  In addition, I have reviewed and discussed with patient  certain preventive protocols, quality metrics, and best practice recommendations. A written personalized care plan for preventive services as well as general preventive health recommendations were provided to patient.     Clemetine Marker, LPN   [id number]   Nurse Notes: pt states Dr. Ancil Boozer reduced celebrex to once daily dosage at last visit but she does not feel like this is completely covering her pain in hands and feet. Pt states she occasionally takes naproxen if needed but aware to avoid additional NSAIDS if possible. Pt advised to discuss with Dr. Ancil Boozer but she would like to know if dosage can be increased back to twice daily. Please advise. Thank you.

## 2021-06-20 NOTE — Patient Instructions (Signed)
[name] , Thank you for taking time to come for your Medicare Wellness Visit. I appreciate your ongoing commitment to your health goals. Please review the following plan we discussed and let me know if I can assist you in the future.   Screening recommendations/referrals: Colonoscopy: Cologuard done 05/07/21. Repeat 04/2024 Mammogram: done 05/30/19. Please call [phone] to schedule your mammogram and bone density screening.  Bone Density: done 11/03/17 Recommended yearly ophthalmology/optometry visit for glaucoma screening and checkup Recommended yearly dental visit for hygiene and checkup  Vaccinations: Influenza vaccine: done 11/06/20 Pneumococcal vaccine: done 11/19/16 Tdap vaccine: done 12/26/19 Shingles vaccine: Shingrix discussed. Please contact your pharmacy for coverage information.  Covid-19:done 03/09/19 & 03/30/19  Advanced directives: Please bring a copy of your health care power of attorney and living will to the office at your convenience.   Conditions/risks identified: recommend increasing physical activity as tolerated  Next appointment: Follow up in one year for your annual wellness visit    Preventive Care 65 Years and Older, Female Preventive care refers to lifestyle choices and visits with your health care provider that can promote health and wellness. What does preventive care include? A yearly physical exam. This is also called an annual well check. Dental exams once or twice a year. Routine eye exams. Ask your health care provider how often you should have your eyes checked. Personal lifestyle choices, including: Daily care of your teeth and gums. Regular physical activity. Eating a healthy diet. Avoiding tobacco and drug use. Limiting alcohol use. Practicing safe sex. Taking low-dose aspirin every day. Taking vitamin and mineral supplements as recommended by your health care provider. What happens during an annual well check? The services and screenings  done by your health care provider during your annual well check will depend on your age, overall health, lifestyle risk factors, and family history of disease. Counseling  Your health care provider may ask you questions about your: Alcohol use. Tobacco use. Drug use. Emotional well-being. Home and relationship well-being. Sexual activity. Eating habits. History of falls. Memory and ability to understand (cognition). Work and work Astronomer. Reproductive health. Screening  You may have the following tests or measurements: Height, weight, and BMI. Blood pressure. Lipid and cholesterol levels. These may be checked every 5 years, or more frequently if you are over 97 years old. Skin check. Lung cancer screening. You may have this screening every year starting at age 36 if you have a 30-pack-year history of smoking and currently smoke or have quit within the past 15 years. Fecal occult blood test (FOBT) of the stool. You may have this test every year starting at age 61. Flexible sigmoidoscopy or colonoscopy. You may have a sigmoidoscopy every 5 years or a colonoscopy every 10 years starting at age 74. Hepatitis C blood test. Hepatitis B blood test. Sexually transmitted disease (STD) testing. Diabetes screening. This is done by checking your blood sugar (glucose) after you have not eaten for a while (fasting). You may have this done every 1-3 years. Bone density scan. This is done to screen for osteoporosis. You may have this done starting at age 2. Mammogram. This may be done every 1-2 years. Talk to your health care provider about how often you should have regular mammograms. Talk with your health care provider about your test results, treatment options, and if necessary, the need for more tests. Vaccines  Your health care provider may recommend certain vaccines, such as: Influenza vaccine. This is recommended every year. Tetanus, diphtheria, and acellular pertussis (  Tdap, Td)  vaccine. You may need a Td booster every 10 years. Zoster vaccine. You may need this after age 61. Pneumococcal 13-valent conjugate (PCV13) vaccine. One dose is recommended after age 70. Pneumococcal polysaccharide (PPSV23) vaccine. One dose is recommended after age 15. Talk to your health care provider about which screenings and vaccines you need and how often you need them. This information is not intended to replace advice given to you by your health care provider. Make sure you discuss any questions you have with your health care provider. Document Released: 02/09/2015 Document Revised: 10/03/2015 Document Reviewed: 11/14/2014 Elsevier Interactive Patient Education  2017 [place] Prevention in the Home Falls can cause injuries. They can happen to people of all ages. There are many things you can do to make your home safe and to help prevent falls. What can I do on the outside of my home? Regularly fix the edges of walkways and driveways and fix any cracks. Remove anything that might make you trip as you walk through a door, such as a raised step or threshold. Trim any bushes or trees on the path to your home. Use bright outdoor lighting. Clear any walking paths of anything that might make someone trip, such as rocks or tools. Regularly check to see if handrails are loose or broken. Make sure that both sides of any steps have handrails. Any raised decks and porches should have guardrails on the edges. Have any leaves, snow, or ice cleared regularly. Use sand or salt on walking paths during winter. Clean up any spills in your garage right away. This includes oil or grease spills. What can I do in the bathroom? Use night lights. Install grab bars by the toilet and in the tub and shower. Do not use towel bars as grab bars. Use non-skid mats or decals in the tub or shower. If you need to sit down in the shower, use a plastic, non-slip stool. Keep the floor dry. Clean up any  water that spills on the floor as soon as it happens. Remove soap buildup in the tub or shower regularly. Attach bath mats securely with double-sided non-slip rug tape. Do not have throw rugs and other things on the floor that can make you trip. What can I do in the bedroom? Use night lights. Make sure that you have a light by your bed that is easy to reach. Do not use any sheets or blankets that are too big for your bed. They should not hang down onto the floor. Have a firm chair that has side arms. You can use this for support while you get dressed. Do not have throw rugs and other things on the floor that can make you trip. What can I do in the kitchen? Clean up any spills right away. Avoid walking on wet floors. Keep items that you use a lot in easy-to-reach places. If you need to reach something above you, use a strong step stool that has a grab bar. Keep electrical cords out of the way. Do not use floor polish or wax that makes floors slippery. If you must use wax, use non-skid floor wax. Do not have throw rugs and other things on the floor that can make you trip. What can I do with my stairs? Do not leave any items on the stairs. Make sure that there are handrails on both sides of the stairs and use them. Fix handrails that are broken or loose. Make sure that handrails are  as long as the stairways. Check any carpeting to make sure that it is firmly attached to the stairs. Fix any carpet that is loose or worn. Avoid having throw rugs at the top or bottom of the stairs. If you do have throw rugs, attach them to the floor with carpet tape. Make sure that you have a light switch at the top of the stairs and the bottom of the stairs. If you do not have them, ask someone to add them for you. What else can I do to help prevent falls? Wear shoes that: Do not have high heels. Have rubber bottoms. Are comfortable and fit you well. Are closed at the toe. Do not wear sandals. If you use a  stepladder: Make sure that it is fully opened. Do not climb a closed stepladder. Make sure that both sides of the stepladder are locked into place. Ask someone to hold it for you, if possible. Clearly mark and make sure that you can see: Any grab bars or handrails. First and last steps. Where the edge of each step is. Use tools that help you move around (mobility aids) if they are needed. These include: Canes. Walkers. Scooters. Crutches. Turn on the lights when you go into a dark area. Replace any light bulbs as soon as they burn out. Set up your furniture so you have a clear path. Avoid moving your furniture around. If any of your floors are uneven, fix them. If there are any pets around you, be aware of where they are. Review your medicines with your doctor. Some medicines can make you feel dizzy. This can increase your chance of falling. Ask your doctor what other things that you can do to help prevent falls. This information is not intended to replace advice given to you by your health care provider. Make sure you discuss any questions you have with your health care provider. Document Released: 11/09/2008 Document Revised: 06/21/2015 Document Reviewed: 02/17/2014 Elsevier Interactive Patient Education  2017 [employer].

## 2021-06-27 DIAGNOSIS — Z0100 Encounter for examination of eyes and vision without abnormal findings: Secondary | ICD-10-CM | POA: Diagnosis not present

## 2021-08-29 ENCOUNTER — Ambulatory Visit
Admission: EM | Admit: 2021-08-29 | Discharge: 2021-08-29 | Disposition: A | Discharge status: Home / Self Care | Payer: Medicare HMO | Attending: Family Medicine | Admitting: Family Medicine

## 2021-08-29 DIAGNOSIS — W57XXXA Bitten or stung by nonvenomous insect and other nonvenomous arthropods, initial encounter: Secondary | ICD-10-CM | POA: Diagnosis not present

## 2021-08-29 DIAGNOSIS — S20162A Insect bite (nonvenomous) of breast, left breast, initial encounter: Secondary | ICD-10-CM

## 2021-08-29 DIAGNOSIS — R21 Rash and other nonspecific skin eruption: Secondary | ICD-10-CM

## 2021-08-29 MED ORDER — DOXYCYCLINE HYCLATE 100 MG PO CAPS: 100.0000 mg | ORAL_CAPSULE | Freq: Two times a day (BID) | ORAL | 0 refills | Status: AC

## 2021-08-29 NOTE — ED Provider Notes (Signed)
[facility]    CSN: [id number] Arrival date & time: 08/29/21  6160      History   Chief Complaint Chief Complaint  Patient presents with   Insect Bite    tick    HPI Kari Lee is a 76 y.o. female.   HPI   Pt states she found a tick bite on her about two days ago.  She doesn't recall getting bit. [place] ticks.  She has been bitten by ticks before and typically remove them easily. She believes part of the tick is still inside of the bite.  No fever, headache, joint pain, cough, rhinorrhea, new back or neck pain. Eating and drinking well.  Of note, she has a red rash on her left breast.      Past Medical History:  Diagnosis Date   Anxiety    Arthritis    Bradycardia    Bronchitis    Complication of anesthesia    woke up during hip surgery   Dysrhythmia    congenital bradycardia   GERD (gastroesophageal reflux disease)    History of positive PPD 2009   History of positive PPD 2009   INH through health dept CXR's needed for clearance   Hyperlipidemia    Hypertension    Insomnia    Lumbar spondylosis    Managed by Dr. Kennedy Bucker with [facility]   Obesity    Rosacea    Spinal stenosis    Spondylosis of cervical spine     Patient Active Problem List   Diagnosis Date Noted   S/P left rotator cuff repair 11/06/2020   Vitamin D deficiency 12/26/2019   DDD (degenerative disc disease), lumbar 12/26/2019   Lumbar stenosis 12/26/2019   Osteoarthritis 08/19/2016   Fatty liver disease, nonalcoholic 05/30/2016   Bradycardia 05/20/2016   Current long-term use of postmenopausal hormone replacement therapy 05/20/2016   Hyperlipidemia, unspecified 05/20/2016   Hypertension 05/20/2016   Rosacea 05/20/2016   History of hip replacement, total, 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    Past Surgical History:  Procedure Laterality Date   CERVICAL CONE BIOPSY     COLONOSCOPY  10/2010   COLONOSCOPY  2004   endoscopic carpal  tunn Bilateral    HALLUX VALGUS CORRECTION Right 2005   HYSTERECTOMY ABDOMINAL WITH SALPINGECTOMY  1981   INCISION TENDON SHEATH HAND Right    JOINT REPLACEMENT Right 11/21/2010   knee   JOINT REPLACEMENT Left 11/04/2011   hip   JOINT REPLACEMENT Left 04/27/2012   knee   KNEE ARTHROSCOPY Right    KNEE ARTHROSCOPY Left 12/18/2011   SHOULDER ARTHROSCOPY WITH SUBACROMIAL DECOMPRESSION, ROTATOR CUFF REPAIR AND BICEP TENDON REPAIR Left 09/20/2020   Procedure: SHOULDER ARTHROSCOPY WITH DEBRIDEMENT, DECOMPRESSION, ROTATOR CUFF REPAIR AND BICEPS TENODESIS;  Surgeon: Christena Flake, MD;  Location: [facility] ORS;  Service: Orthopedics;  Laterality: Left;    OB History   No obstetric history on file.      Home Medications    Prior to Admission medications   Medication Sig Start Date End Date Taking? Authorizing Provider  doxycycline (VIBRAMYCIN) 100 MG capsule Take 1 capsule (100 mg total) by mouth 2 (two) times daily for 14 days. 08/29/21 09/12/21 Yes Samier Jaco, DO  acetaminophen (TYLENOL) 500 MG tablet Take 1 tablet (500 mg total) by mouth 2 (two) times daily. 08/19/16   Alba Cory, MD  Ascorbic Acid (VITAMIN C) 1000 MG tablet Take 1,000 mg by mouth  daily.    [provider]  atorvastatin (LIPITOR) 40 MG tablet Take 1 tablet (40 mg total) by mouth daily. 04/24/21   Alba Cory, MD  celecoxib (CELEBREX) 200 MG capsule Take 1 capsule (200 mg total) by mouth daily. 04/24/21   Alba Cory, MD  estradiol (ESTRACE) 0.5 MG tablet Take 1 tablet (0.5 mg total) by mouth daily. 04/24/21   Alba Cory, MD  losartan (COZAAR) 50 MG tablet Take 1 tablet (50 mg total) by mouth daily. 04/24/21   Alba Cory, MD  Multiple Vitamin (MULTIVITAMIN) tablet Take 1 tablet by mouth daily.    [provider]  Vitamin D, Cholecalciferol, 50 MCG (2000 UT) CAPS Take 2,000 Units by mouth daily.    [provider]    Family History Family History  Problem Relation Age of Onset    Alzheimer's disease Mother 88   Osteoporosis Mother    Stroke Father 56   Arthritis Sister    Basal cell carcinoma Sister    Breast cancer Maternal Aunt 90   Macular degeneration Other     Social History Social History   Tobacco Use   Smoking status: Never   Smokeless tobacco: Never  Vaping Use   Vaping Use: Never used  Substance Use Topics   Alcohol use: Yes    Alcohol/week: 1.0 standard drink of alcohol    Types: 1 Glasses of wine per week   Drug use: No     Allergies   Sulfa antibiotics and Penicillins   Review of Systems Review of Systems :negative unless otherwise stated in HPI.      Physical Exam Triage Vital Signs ED Triage Vitals  Enc Vitals Group     BP 08/29/21 0852 (!) 160/82     Pulse Rate 08/29/21 0852 (!) 56     Resp --      Temp 08/29/21 0852 98 F (36.7 C)     Temp Source 08/29/21 0852 Oral     SpO2 08/29/21 0852 98 %     Weight 08/29/21 0850 180 lb (81.6 kg)     Height 08/29/21 0850 5\' 4"  (1.626 m)     Head Circumference --      Peak Flow --      Pain Score 08/29/21 0850 0     Pain Loc --      Pain Edu? --      Excl. in GC? --    No data found.  Updated Vital Signs BP (!) 160/82 (BP Location: Left Arm)   Pulse (!) 56   Temp 98 F (36.7 C) (Oral)   Ht 5\' 4"  (1.626 m)   Wt 81.6 kg   SpO2 98%   BMI 30.90 kg/m   Visual Acuity Right Eye Distance:   Left Eye Distance:   Bilateral Distance:    Right Eye Near:   Left Eye Near:    Bilateral Near:     Physical Exam  GEN: well appearing, pleasant female in no acute distress  NECK: no meningismus CVS: well perfused  RESP: speaking in full sentences without pause, no respiratory distress  SKIN: Left breast with erythematous targetoid rash (see image below), trunk and extremity skin exam without additional targetoid lesions or petechiae, left knee with old bug bites, hard flat insect removed from left breast bite NEURO: alert and oriented      UC Treatments / Results   Labs (all labs ordered are listed, but only abnormal results are displayed) Labs Reviewed - No data  to display  EKG   Radiology No results found.  Procedures Procedures (including critical care time)  Medications Ordered in UC Medications - No data to display  Initial Impression / Assessment and Plan / UC Course  I have reviewed the triage vital signs and the nursing notes.  Pertinent labs & imaging results that were available during my care of the patient were reviewed by me and considered in my medical decision making (see chart for details).      Erythema Migrans: Pt is a 76 yo female with erythema migrans first noticed about 2 days ago after tick bite.  She does not have a petechial rash, headache or joint pain.  Tick was removed here in the office.  Patient is otherwise asymptomatic and well-appearing.  There is no concern for neurological compromise at this time.  Prognosis uncertain.   Considered laboratory evaluation however pt with characteristic rash after tick bite. Lyme disease probability high. Treat with a doxycycline for 14 days. Follow-up with PCP during this time.    Discussed MDM, treatment plan and plan for follow-up with patient/parent who agrees with plan.     Final Clinical Impressions(s) / UC Diagnoses   Final diagnoses:  Tick bite of left breast, initial encounter  Rash     Discharge Instructions      Stop by the pharmacy to pick up your prescriptions.   Follow up with your primary care provider in 1-2 weeks. Go to the ED if rash worsens or your begin to have fever or headache.        ED Prescriptions     Medication Sig Dispense Auth. Provider   doxycycline (VIBRAMYCIN) 100 MG capsule Take 1 capsule (100 mg total) by mouth 2 (two) times daily for 14 days. 28 capsule Shade Kaley, DO      PDMP not reviewed this encounter.   Katha Cabal, DO 08/29/21 [phone]

## 2021-08-29 NOTE — ED Triage Notes (Signed)
Patient reports that she has a tick bite and a target rash on the left breast  Patient noticed the tick bite about 2 days ago.   Patient thinks the tick is still attached

## 2021-08-29 NOTE — Discharge Instructions (Addendum)
Stop by the pharmacy to pick up your prescriptions.   Follow up with your primary care provider in 1-2 weeks. Go to the ED if rash worsens or your begin to have fever or headache.

## 2021-09-02 ENCOUNTER — Ambulatory Visit
Admission: RE | Admit: 2021-09-02 | Discharge: 2021-09-02 | Disposition: A | Discharge status: Home / Self Care | Payer: Medicare HMO | Source: Ambulatory Visit | Attending: Family Medicine | Admitting: Family Medicine

## 2021-09-02 DIAGNOSIS — Z1231 Encounter for screening mammogram for malignant neoplasm of breast: Secondary | ICD-10-CM | POA: Insufficient documentation

## 2021-09-02 DIAGNOSIS — Z780 Asymptomatic menopausal state: Secondary | ICD-10-CM | POA: Diagnosis not present

## 2021-10-02 DIAGNOSIS — B029 Zoster without complications: Secondary | ICD-10-CM | POA: Diagnosis not present

## 2021-10-25 ENCOUNTER — Ambulatory Visit: Payer: Medicare HMO | Admitting: Family Medicine

## 2021-10-27 ENCOUNTER — Other Ambulatory Visit: Payer: Self-pay | Admitting: Family Medicine

## 2021-10-27 DIAGNOSIS — I10 Essential (primary) hypertension: Secondary | ICD-10-CM

## 2021-10-28 NOTE — Telephone Encounter (Signed)
Left voicemail to verify if patient has enough of her medication until her follow up appointment on Friday.

## 2021-10-31 ENCOUNTER — Other Ambulatory Visit: Payer: Self-pay | Admitting: Family Medicine

## 2021-10-31 NOTE — Progress Notes (Signed)
Name: Kari Lee   MRN: [id number]    DOB: 02-11-1945   Date:11/01/2021       Progress Note  Subjective  Chief Complaint  Follow Up  HPI  HTN  She is taking  Losartan, she denies chest pain, palpitation of dizziness. Denies side effects of medication . BP is at goal today   History of shingles on right arm: she went to Urgent care Sep 2023 and was given valtrex and prednisone and bp was very high that day.   Bradycardia: she states her heart rate is always slow and seen by cardiologist - [place]  but negative evaluation. She states usually in the 60's. Today it is in the 70's     Overweight: she joined [employer] 05/26/2017, she started at 200.2 lbs and she was down to 173 lbs but during the pandemic it went up to 193 lbs, she was down to 173 lbs during her last visit in March but stopped weight watchers and weight is up again at 187 lbs , she will resume [employer] again   Hot Flashes: she saw Dr. Star Age  she was noticing dryness in her face and also hot flashes, She tried to stop but it caused hot flashes ,she is compliant with medications at this time.    Elevated liver enzymes: over 100's , seen by Dr. Durwin Reges, US showed fatty liver, changed diet and lost weight, liver enzymes normalized. She is gaining weight again , she will resume a healthier diet    OA: most symptoms now on her hands, but has a history of bilateral  knee replacement and left hip, has DIP deformities she is now on celebrex 200 mg but is having to take Tylenol daily, we will try switching to 100 mg BID to see if will help    Hyperlipidemia: she is on atorvastatin and is doing well at this time, she denies myalgia. Last level was at goal, triglycerides back to normal. 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 Rudene Christians and referred to Dr. Sharlet Salina, and had PT and two steroid injection, she states pain going down her legs have resolved. She will go back if  needed   IMPRESSION: Transitional lumbosacral anatomy. Please see numbering scheme above and correlate with plain films if any intervention is planned.   Disc bulge to the left at L2-3 causes narrowing in the left subarticular recess which could impact the left L3 root. There is also mild left foraminal narrowing at this level.   Disc and endplate spur to the right at L3-4 cause narrowing in the right subarticular recess and could impact the descending right L4 root. Moderate to moderately severe foraminal narrowing at L3-4 is worse on the left.  Cervical myelopathy: seen by Dr. Lacinda Axon back in June 2022 and surgery was discussed versus surveillance . She denies any weakness or numbness. Advised her to go back at least yearly but she is not ready at this time.   Patient Active Problem List   Diagnosis Date Noted   S/P left rotator cuff repair 11/06/2020   Vitamin D deficiency 12/26/2019   DDD (degenerative disc disease), lumbar 12/26/2019   Lumbar stenosis 12/26/2019   Osteoarthritis 08/19/2016   Fatty liver disease, nonalcoholic 44/01/270   Bradycardia 05/20/2016   Current long-term use of postmenopausal hormone replacement therapy 05/20/2016   Hyperlipidemia, unspecified 05/20/2016   Hypertension 05/20/2016   Rosacea 05/20/2016   History of hip  replacement, total, 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    Past Surgical History:  Procedure Laterality Date   CERVICAL CONE BIOPSY     COLONOSCOPY  10/2010   COLONOSCOPY  2004   endoscopic carpal tunn Bilateral    HALLUX VALGUS CORRECTION Right 2005   HYSTERECTOMY ABDOMINAL WITH SALPINGECTOMY  1981   INCISION TENDON SHEATH HAND Right    JOINT REPLACEMENT Right 11/21/2010   knee   JOINT REPLACEMENT Left 11/04/2011   hip   JOINT REPLACEMENT Left 04/27/2012   knee   KNEE ARTHROSCOPY Right    KNEE ARTHROSCOPY Left 12/18/2011   SHOULDER ARTHROSCOPY WITH SUBACROMIAL DECOMPRESSION, ROTATOR CUFF REPAIR AND BICEP TENDON REPAIR Left 09/20/2020    Procedure: SHOULDER ARTHROSCOPY WITH DEBRIDEMENT, DECOMPRESSION, ROTATOR CUFF REPAIR AND BICEPS TENODESIS;  Surgeon: Christena Flake, MD;  Location: [facility];  Service: Orthopedics;  Laterality: Left;    Family History  Problem Relation Age of Onset   Alzheimer's disease Mother 37   Osteoporosis Mother    Stroke Father 52   Arthritis Sister    Basal cell carcinoma Sister    Breast cancer Maternal Aunt 44   Macular degeneration Other     Social History   Tobacco Use   Smoking status: Never   Smokeless tobacco: Never  Substance Use Topics   Alcohol use: Yes    Alcohol/week: 1.0 standard drink of alcohol    Types: 1 Glasses of wine per week     Current Outpatient Medications:    acetaminophen (TYLENOL) 500 MG tablet, Take 1 tablet (500 mg total) by mouth 2 (two) times daily., Disp: 60 tablet, Rfl: 0   Ascorbic Acid (VITAMIN C) 1000 MG tablet, Take 1,000 mg by mouth daily., Disp: , Rfl:    atorvastatin (LIPITOR) 40 MG tablet, Take 1 tablet (40 mg total) by mouth daily., Disp: 90 tablet, Rfl: 1   celecoxib (CELEBREX) 200 MG capsule, Take 1 capsule (200 mg total) by mouth daily., Disp: 90 capsule, Rfl: 1   estradiol (ESTRACE) 0.5 MG tablet, Take 1 tablet (0.5 mg total) by mouth daily., Disp: 90 tablet, Rfl: 3   losartan (COZAAR) 50 MG tablet, Take 1 tablet (50 mg total) by mouth daily., Disp: 90 tablet, Rfl: 1   Multiple Vitamin (MULTIVITAMIN) tablet, Take 1 tablet by mouth daily., Disp: , Rfl:    Vitamin D, Cholecalciferol, 50 MCG (2000 UT) CAPS, Take 2,000 Units by mouth daily., Disp: , Rfl:   Allergies  Allergen Reactions   Sulfa Antibiotics Nausea Only   Penicillins Rash    TOLERATED CEFAZOLIN    I personally reviewed active problem list, medication list, allergies, family history, social history, health maintenance with the patient/caregiver today.   ROS  Constitutional: Negative for fever , positive for weight change.  Respiratory: Negative for cough and shortness of  breath.   Cardiovascular: Negative for chest pain or palpitations.  Gastrointestinal: Negative for abdominal pain, no bowel changes.  Musculoskeletal: Negative for gait problem but has hand  joint swelling.  Skin: Negative for rash.  Neurological: Negative for dizziness or headache.  No other specific complaints in a complete review of systems (except as listed in HPI above).   Objective  Vitals:   11/01/21 1342  BP: 132/68  Pulse: 79  Resp: 16  SpO2: 98%  Weight: 187 lb (84.8 kg)  Height: 5\' 3"  (1.6 m)    Body mass index is 33.13 kg/m.  Physical Exam  Constitutional: Patient appears well-developed and well-nourished. Obese  No distress.  HEENT: head  atraumatic, normocephalic, pupils equal and reactive to light, neck supple Cardiovascular: Normal rate, regular rhythm and normal heart sounds.  No murmur heard. No BLE edema. Pulmonary/Chest: Effort normal and breath sounds normal. No respiratory distress. Abdominal: Soft.  There is no tenderness. Psychiatric: Patient has a normal mood and affect. behavior is normal. Judgment and thought content normal.  Muscular skeletal: hand deformities    PHQ2/9:    11/01/2021    1:41 PM 06/20/2021    9:09 AM 04/24/2021    2:36 PM 11/06/2020    8:16 AM 10/12/2020    8:52 AM  Depression screen PHQ 2/9  Decreased Interest 0 0 0 0 0  Down, Depressed, Hopeless 0 0 0 0 0  PHQ - 2 Score 0 0 0 0 0  Altered sleeping 0  0  0  Tired, decreased energy 0  0  0  Change in appetite 0  0  0  Feeling bad or failure about yourself  0  0  0  Trouble concentrating 0  0  0  Moving slowly or fidgety/restless 0  0  0  Suicidal thoughts 0  0  0  PHQ-9 Score 0  0  0    phq 9 is negative   Fall Risk:    11/01/2021    1:39 PM 06/20/2021    9:12 AM 06/16/2021    5:09 PM 04/24/2021    2:35 PM 11/06/2020    8:16 AM  Fall Risk   Falls in the past year? 0 0 0 0 0  Number falls in past yr: 0 0  0 0  Injury with Fall? 0 0  0 0  Risk for fall due to : No  Fall Risks No Fall Risks  No Fall Risks No Fall Risks  Follow up Falls prevention discussed Falls prevention discussed  Falls prevention discussed Falls prevention discussed      Functional Status Survey: Is the patient deaf or have difficulty hearing?: Yes Does the patient have difficulty seeing, even when wearing glasses/contacts?: No Does the patient have difficulty concentrating, remembering, or making decisions?: No Does the patient have difficulty walking or climbing stairs?: No Does the patient have difficulty dressing or bathing?: No Does the patient have difficulty doing errands alone such as visiting a doctor's office or shopping?: No    Assessment & Plan  1. Primary osteoarthritis of both hands  - celecoxib (CELEBREX) 100 MG capsule; Take 1 capsule (100 mg total) by mouth 2 (two) times daily.  Dispense: 180 capsule; Refill: 1  2. Mixed hyperlipidemia  - atorvastatin (LIPITOR) 40 MG tablet; Take 1 tablet (40 mg total) by mouth daily.  Dispense: 90 tablet; Refill: 1  3. Essential hypertension  - losartan (COZAAR) 50 MG tablet; Take 1 tablet (50 mg total) by mouth daily.  Dispense: 90 tablet; Refill: 1  4. Postmenopausal estrogen deficiency   5. Primary osteoarthritis involving multiple joints   6. Vitamin D deficiency  Continue supplementation   7. History of shingles    8. Spinal stenosis of lumbar region without neurogenic claudication    9. Cervical myelopathy (HCC)  She will think about going back , no symptoms at this time

## 2021-11-01 ENCOUNTER — Encounter: Payer: Self-pay | Admitting: Family Medicine

## 2021-11-01 ENCOUNTER — Ambulatory Visit (INDEPENDENT_AMBULATORY_CARE_PROVIDER_SITE_OTHER): Payer: Medicare HMO | Admitting: Family Medicine

## 2021-11-01 VITALS — BP 132/68 | HR 79 | Resp 16 | Ht 63.0 in | Wt 187.0 lb

## 2021-11-01 DIAGNOSIS — M19041 Primary osteoarthritis, right hand: Secondary | ICD-10-CM

## 2021-11-01 DIAGNOSIS — G959 Disease of spinal cord, unspecified: Secondary | ICD-10-CM | POA: Diagnosis not present

## 2021-11-01 DIAGNOSIS — M48061 Spinal stenosis, lumbar region without neurogenic claudication: Secondary | ICD-10-CM | POA: Diagnosis not present

## 2021-11-01 DIAGNOSIS — E559 Vitamin D deficiency, unspecified: Secondary | ICD-10-CM

## 2021-11-01 DIAGNOSIS — E782 Mixed hyperlipidemia: Secondary | ICD-10-CM | POA: Diagnosis not present

## 2021-11-01 DIAGNOSIS — Z780 Asymptomatic menopausal state: Secondary | ICD-10-CM

## 2021-11-01 DIAGNOSIS — M19042 Primary osteoarthritis, left hand: Secondary | ICD-10-CM

## 2021-11-01 DIAGNOSIS — M159 Polyosteoarthritis, unspecified: Secondary | ICD-10-CM | POA: Diagnosis not present

## 2021-11-01 DIAGNOSIS — Z8619 Personal history of other infectious and parasitic diseases: Secondary | ICD-10-CM | POA: Diagnosis not present

## 2021-11-01 DIAGNOSIS — I10 Essential (primary) hypertension: Secondary | ICD-10-CM

## 2021-11-01 MED ORDER — CELECOXIB 100 MG PO CAPS: 100.0000 mg | ORAL_CAPSULE | Freq: Two times a day (BID) | ORAL | 1 refills | Status: DC

## 2021-11-01 MED ORDER — ATORVASTATIN CALCIUM 40 MG PO TABS: 40.0000 mg | ORAL_TABLET | Freq: Every day | ORAL | 1 refills | Status: DC

## 2021-11-01 MED ORDER — LOSARTAN POTASSIUM 50 MG PO TABS: 50.0000 mg | ORAL_TABLET | Freq: Every day | ORAL | 1 refills | Status: DC

## 2021-11-08 ENCOUNTER — Other Ambulatory Visit: Payer: Self-pay | Admitting: Family Medicine

## 2021-11-08 DIAGNOSIS — M19042 Primary osteoarthritis, left hand: Secondary | ICD-10-CM

## 2022-01-07 NOTE — Patient Instructions (Signed)
Preventive Care 65 Years and Older, Female Preventive care refers to lifestyle choices and visits with your health care provider that can promote health and wellness. Preventive care visits are also called wellness exams. What can I expect for my preventive care visit? Counseling Your health care provider may ask you questions about your: Medical history, including: Past medical problems. Family medical history. Pregnancy and menstrual history. History of falls. Current health, including: Memory and ability to understand (cognition). Emotional well-being. Home life and relationship well-being. Sexual activity and sexual health. Lifestyle, including: Alcohol, nicotine or tobacco, and drug use. Access to firearms. Diet, exercise, and sleep habits. Work and work environment. Sunscreen use. Safety issues such as seatbelt and bike helmet use. Physical exam Your health care provider will check your: Height and weight. These may be used to calculate your BMI (body mass index). BMI is a measurement that tells if you are at a healthy weight. Waist circumference. This measures the distance around your waistline. This measurement also tells if you are at a healthy weight and may help predict your risk of certain diseases, such as type 2 diabetes and high blood pressure. Heart rate and blood pressure. Body temperature. Skin for abnormal spots. What immunizations do I need?  Vaccines are usually given at various ages, according to a schedule. Your health care provider will recommend vaccines for you based on your age, medical history, and lifestyle or other factors, such as travel or where you work. What tests do I need? Screening Your health care provider may recommend screening tests for certain conditions. This may include: Lipid and cholesterol levels. Hepatitis C test. Hepatitis B test. HIV (human immunodeficiency virus) test. STI (sexually transmitted infection) testing, if you are at  risk. Lung cancer screening. Colorectal cancer screening. Diabetes screening. This is done by checking your blood sugar (glucose) after you have not eaten for a while (fasting). Mammogram. Talk with your health care provider about how often you should have regular mammograms. BRCA-related cancer screening. This may be done if you have a family history of breast, ovarian, tubal, or peritoneal cancers. Bone density scan. This is done to screen for osteoporosis. Talk with your health care provider about your test results, treatment options, and if necessary, the need for more tests. Follow these instructions at home: Eating and drinking  Eat a diet that includes fresh fruits and vegetables, whole grains, lean protein, and low-fat dairy products. Limit your intake of foods with high amounts of sugar, saturated fats, and salt. Take vitamin and mineral supplements as recommended by your health care provider. Do not drink alcohol if your health care provider tells you not to drink. If you drink alcohol: Limit how much you have to 0-1 drink a day. Know how much alcohol is in your drink. In the [place] one drink equals one 12 oz bottle of beer (355 mL), one 5 oz glass of wine (148 mL), or one 1 oz glass of hard liquor (44 mL). Lifestyle Brush your teeth every morning and night with fluoride toothpaste. Floss one time each day. Exercise for at least 30 minutes 5 or more days each week. Do not use any products that contain nicotine or tobacco. These products include cigarettes, chewing tobacco, and vaping devices, such as e-cigarettes. If you need help quitting, ask your health care provider. Do not use drugs. If you are sexually active, practice safe sex. Use a condom or other form of protection in order to prevent STIs. Take aspirin only as told by   your health care provider. Make sure that you understand how much to take and what form to take. Work with your health care provider to find out whether it  is safe and beneficial for you to take aspirin daily. Ask your health care provider if you need to take a cholesterol-lowering medicine (statin). Find healthy ways to manage stress, such as: Meditation, yoga, or listening to music. Journaling. Talking to a trusted person. Spending time with friends and family. Minimize exposure to UV radiation to reduce your risk of skin cancer. Safety Always wear your seat belt while driving or riding in a vehicle. Do not drive: If you have been drinking alcohol. Do not ride with someone who has been drinking. When you are tired or distracted. While texting. If you have been using any mind-altering substances or drugs. Wear a helmet and other protective equipment during sports activities. If you have firearms in your house, make sure you follow all gun safety procedures. What's next? Visit your health care provider once a year for an annual wellness visit. Ask your health care provider how often you should have your eyes and teeth checked. Stay up to date on all vaccines. This information is not intended to replace advice given to you by your health care provider. Make sure you discuss any questions you have with your health care provider. Document Revised: 07/11/2020 Document Reviewed: 07/11/2020 [employer]vier Patient Education  2023 [employer]  

## 2022-01-07 NOTE — Progress Notes (Unsigned)
Name: Kari Lee   MRN: [id number]    DOB: [birth year]   Date:01/08/2022       Progress Note  Subjective  Chief Complaint  Annual Exam  HPI  Patient presents for annual CPE.  Diet: balanced  Exercise: discussed importance of resuming regular physical activity   Last Eye Exam: up to date  Last Dental Exam: up to date   [place] from 06/20/2021 in [facility]  AUDIT-C Score 1      Depression: Phq 9 is  negative    01/08/2022    8:22 AM 11/01/2021    1:41 PM 06/20/2021    9:09 AM 04/24/2021    2:36 PM 11/06/2020    8:16 AM  Depression screen PHQ 2/9  Decreased Interest 0 0 0 0 0  Down, Depressed, Hopeless 0 0 0 0 0  PHQ - 2 Score 0 0 0 0 0  Altered sleeping 0 0  0   Tired, decreased energy 0 0  0   Change in appetite 0 0  0   Feeling bad or failure about yourself  0 0  0   Trouble concentrating 0 0  0   Moving slowly or fidgety/restless 0 0  0   Suicidal thoughts 0 0  0   PHQ-9 Score 0 0  0    Hypertension: BP Readings from Last 3 Encounters:  01/08/22 124/70  11/01/21 132/68  08/29/21 (!) 160/82   Obesity: Wt Readings from Last 3 Encounters:  01/08/22 189 lb 11.2 oz (86 kg)  11/01/21 187 lb (84.8 kg)  08/29/21 180 lb (81.6 kg)   BMI Readings from Last 3 Encounters:  01/08/22 33.60 kg/m  11/01/21 33.13 kg/m  08/29/21 30.90 kg/m     Vaccines:   RSV: refused  Tdap: up to date Shingrix: refused  Pneumonia: up to date Flu: refused  COVID-19: discussed booster    Hep C Screening: 05/30/16 STD testing and prevention (HIV/chl/gon/syphilis): N/A Intimate partner violence: negative screen  Sexual History : no pain  Menstrual History/LMP/Abnormal Bleeding:hysterectomy  Discussed importance of follow up if any post-menopausal bleeding:N/A Incontinence Symptoms: negative for symptoms   Breast cancer:  - Last Mammogram: 09/02/21 - BRCA gene screening: N/A  Osteoporosis Prevention : Discussed high calcium and  vitamin D supplementation, weight bearing exercises Bone density: 09/02/21   Cervical cancer screening: N/A  Skin cancer: Discussed monitoring for atypical lesions  Colorectal cancer: 05/07/21   Lung cancer:  Low Dose CT Chest recommended if Age 66-80 years, 20 pack-year currently smoking OR have quit w/in 15years. Patient does not qualify for screen   ECG: 09/21/20  Advanced Care Planning: A voluntary discussion about advance care planning including the explanation and discussion of advance directives.  Discussed health care proxy and Living will, and the patient was able to identify a health care proxy as husband .  Patient has  a living will and power of attorney of health care   Lipids: Lab Results  Component Value Date   CHOL 150 04/24/2021   CHOL 153 06/26/2020   CHOL 155 06/21/2019   Lab Results  Component Value Date   HDL 67 04/24/2021   HDL 68 06/26/2020   HDL 62 06/21/2019   Lab Results  Component Value Date   LDLCALC 64 04/24/2021   LDLCALC 59 06/26/2020   LDLCALC 72 06/21/2019   Lab Results  Component Value Date   TRIG 103 04/24/2021   TRIG 179 (H) 06/26/2020  TRIG 133 06/21/2019   Lab Results  Component Value Date   CHOLHDL 2.2 04/24/2021   CHOLHDL 2.3 06/26/2020   CHOLHDL 2.5 06/21/2019   No results found for: "LDLDIRECT"  Glucose: Glucose  Date Value Ref Range Status  05/20/2012 125 (H) 65 - 99 mg/dL Final  05/19/2012 93 65 - 99 mg/dL Final  05/11/2012 89 65 - 99 mg/dL Final   Glucose, Bld  Date Value Ref Range Status  04/24/2021 83 65 - 99 mg/dL Final    Comment:    .            Fasting reference interval .   06/26/2020 99 65 - 99 mg/dL Final    Comment:    .            Fasting reference interval .   06/21/2019 95 65 - 99 mg/dL Final    Comment:    .            Fasting reference interval .     Patient Active Problem List   Diagnosis Date Noted   Cervical myelopathy ([place]) 11/01/2021   S/P left rotator cuff repair 11/06/2020    Vitamin D deficiency 12/26/2019   DDD (degenerative disc disease), lumbar 12/26/2019   Spinal stenosis of lumbar region without neurogenic claudication 12/26/2019   Osteoarthritis 08/19/2016   Bradycardia 05/20/2016   Current long-term use of postmenopausal hormone replacement therapy 05/20/2016   Hyperlipidemia, unspecified 05/20/2016   Hypertension 05/20/2016   History of hip replacement, total, 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    Past Surgical History:  Procedure Laterality Date   CERVICAL CONE BIOPSY     COLONOSCOPY  10/2010   COLONOSCOPY  2004   endoscopic carpal tunn Bilateral    HALLUX VALGUS CORRECTION Right 2005   HYSTERECTOMY ABDOMINAL WITH SALPINGECTOMY  1981   INCISION TENDON SHEATH HAND Right    JOINT REPLACEMENT Right 11/21/2010   knee   JOINT REPLACEMENT Left 11/04/2011   hip   JOINT REPLACEMENT Left 04/27/2012   knee   KNEE ARTHROSCOPY Right    KNEE ARTHROSCOPY Left 12/18/2011   SHOULDER ARTHROSCOPY WITH SUBACROMIAL DECOMPRESSION, ROTATOR CUFF REPAIR AND BICEP TENDON REPAIR Left 09/20/2020   Procedure: SHOULDER ARTHROSCOPY WITH DEBRIDEMENT, DECOMPRESSION, ROTATOR CUFF REPAIR AND BICEPS TENODESIS;  Surgeon: Corky Mull, MD;  Location: [facility] ORS;  Service: Orthopedics;  Laterality: Left;    Family History  Problem Relation Age of Onset   Alzheimer's disease Mother 107   Osteoporosis Mother    Stroke Father 88   Arthritis Sister    Basal cell carcinoma Sister    Breast cancer Maternal Aunt 80   Macular degeneration Other     Social History   Socioeconomic History   Marital status: Married    Spouse name: Marcello Moores   Number of children: 2   Years of education: Not on file   Highest education level: Associate degree: occupational, Hotel manager, or vocational program  Occupational History   Occupation: Retired    Comment: Nurse  Tobacco Use   Smoking status: Never   Smokeless tobacco: Never  Vaping Use   Vaping Use: Never used  Substance and Sexual Activity   Alcohol  use: Yes    Alcohol/week: 1.0 standard drink of alcohol    Types: 1 Glasses of wine per week   Drug use: No   Sexual activity: Yes    Partners: Male    Birth control/protection: None, Surgical, Post-menopausal  Other Topics Concern   Not on file  Social  History Narrative   She moved to [place] to downsize, but decided to move back to [place] after 2 years.    Married.   One daughter lives in [place].    The other daughter in [place].   Social Determinants of Health   Financial Resource Strain: Low Risk  (01/08/2022)   Overall Financial Resource Strain (CARDIA)    Difficulty of Paying Living Expenses: Not very hard  Food Insecurity: No Food Insecurity (01/08/2022)   Hunger Vital Sign    Worried About Running Out of Food in the Last Year: Never true    Ran Out of Food in the Last Year: Never true  Transportation Needs: No Transportation Needs (01/08/2022)   PRAPARE - Hydrologist (Medical): No    Lack of Transportation (Non-Medical): No  Physical Activity: Inactive (01/08/2022)   Exercise Vital Sign    Days of Exercise per Week: 0 days    Minutes of Exercise per Session: 0 min  Stress: No Stress Concern Present (01/08/2022)   [place]    Feeling of Stress : Not at all  Social Connections: Unknown (01/08/2022)   Social Connection and Isolation Panel [NHANES]    Frequency of Communication with Friends and Family: Patient refused    Frequency of Social Gatherings with Friends and Family: Patient refused    Attends Religious Services: Patient refused    Active Member of Clubs or Organizations: Patient refused    Attends Archivist Meetings: Patient refused    Marital Status: Married  Human resources officer Violence: Not At Risk (01/08/2022)   Humiliation, Afraid, Rape, and Kick questionnaire    Fear of Current or Ex-Partner: No    Emotionally Abused: No    Physically  Abused: No    Sexually Abused: No     Current Outpatient Medications:    acetaminophen (TYLENOL) 500 MG tablet, Take 1 tablet (500 mg total) by mouth 2 (two) times daily., Disp: 60 tablet, Rfl: 0   Ascorbic Acid (VITAMIN C) 1000 MG tablet, Take 1,000 mg by mouth daily., Disp: , Rfl:    atorvastatin (LIPITOR) 40 MG tablet, Take 1 tablet (40 mg total) by mouth daily., Disp: 90 tablet, Rfl: 1   celecoxib (CELEBREX) 100 MG capsule, Take 1 capsule (100 mg total) by mouth 2 (two) times daily., Disp: 180 capsule, Rfl: 1   estradiol (ESTRACE) 0.5 MG tablet, Take 1 tablet (0.5 mg total) by mouth daily., Disp: 90 tablet, Rfl: 3   losartan (COZAAR) 50 MG tablet, Take 1 tablet (50 mg total) by mouth daily., Disp: 90 tablet, Rfl: 1   Multiple Vitamin (MULTIVITAMIN) tablet, Take 1 tablet by mouth daily., Disp: , Rfl:    Vitamin D, Cholecalciferol, 50 MCG (2000 UT) CAPS, Take 2,000 Units by mouth daily., Disp: , Rfl:   Allergies  Allergen Reactions   Sulfa Antibiotics Nausea Only   Penicillins Rash    TOLERATED CEFAZOLIN     ROS  Constitutional: Negative for fever or weight change.  Respiratory: Negative for cough and shortness of breath.   Cardiovascular: Negative for chest pain or palpitations.  Gastrointestinal: Negative for abdominal pain, no bowel changes.  Musculoskeletal: Negative for gait problem or joint swelling.  Skin: Negative for rash.  Neurological: Negative for dizziness or headache.  No other specific complaints in a complete review of systems (except as listed in HPI above).   Objective  Vitals:   01/08/22 0820  BP: 124/70  Pulse: 76  Resp: 16  Temp: 98.1 F (36.7 C)  TempSrc: Oral  SpO2: 97%  Weight: 189 lb 11.2 oz (86 kg)  Height: _0  (1.6 m)    Body mass index is 33.6 kg/m.  Physical Exam  Constitutional: Patient appears well-developed and well-nourished. Obese  No distress.  HENT: Head: Normocephalic and atraumatic. Ears: B TMs ok, no erythema or  effusion; Nose: Nose normal. Mouth/Throat: Oropharynx is clear and moist. No oropharyngeal exudate.  Eyes: Conjunctivae and EOM are normal. Pupils are equal, round, and reactive to light. No scleral icterus.  Neck: Normal range of motion. Neck supple. No JVD present. No thyromegaly present.  Cardiovascular: Normal rate, regular rhythm and normal heart sounds.  No murmur heard. No BLE edema. Pulmonary/Chest: Effort normal and breath sounds normal. No respiratory distress. Abdominal: Soft. Bowel sounds are normal, no distension. There is no tenderness. no masses Breast: no lumps or masses, no nipple discharge or rashes FEMALE GENITALIA:  Not done  RECTAL: not done  Musculoskeletal: Normal range of motion, no joint effusions. No gross deformities Neurological: he is alert and oriented to person, place, and time. No cranial nerve deficit. Coordination, balance, strength, speech and gait are normal.  Skin: Skin is warm and dry. No rash noted. No erythema.  Psychiatric: Patient has a normal mood and affect. behavior is normal. Judgment and thought content normal.    Fall Risk:    01/08/2022    8:24 AM 11/01/2021    1:39 PM 06/20/2021    9:12 AM 06/16/2021    5:09 PM 04/24/2021    2:35 PM  Fall Risk   Falls in the past year? 1 0 0 0 0  Number falls in past yr: 0 0 0  0  Injury with Fall? 1 0 0  0  Risk for fall due to : History of fall(s) No Fall Risks No Fall Risks  No Fall Risks  Follow up Falls prevention discussed;Education provided;Falls evaluation completed Falls prevention discussed Falls prevention discussed  Falls prevention discussed     Functional Status Survey: Is the patient deaf or have difficulty hearing?: Yes Does the patient have difficulty seeing, even when wearing glasses/contacts?: No Does the patient have difficulty concentrating, remembering, or making decisions?: No Does the patient have difficulty walking or climbing stairs?: No Does the patient have difficulty  dressing or bathing?: No Does the patient have difficulty doing errands alone such as visiting a doctor's office or shopping?: No   Assessment & Plan  1. Well adult exam   2. History of recent fall  Discussed increase in fall risk and ways to avoid falls, needs to resume regular physical activity     -USPSTF grade A and B recommendations reviewed with patient; age-appropriate recommendations, preventive care, screening tests, etc discussed and encouraged; healthy living encouraged; see AVS for patient education given to patient -Discussed importance of 150 minutes of physical activity weekly, eat two servings of fish weekly, eat one serving of tree nuts ( cashews, pistachios, pecans, almonds.Marland Kitchen) every other day, eat 6 servings of fruit/vegetables daily and drink plenty of water and avoid sweet beverages.   -Reviewed Health Maintenance: Yes.

## 2022-01-08 ENCOUNTER — Ambulatory Visit (INDEPENDENT_AMBULATORY_CARE_PROVIDER_SITE_OTHER): Payer: Medicare HMO | Admitting: Family Medicine

## 2022-01-08 ENCOUNTER — Encounter: Payer: Self-pay | Admitting: Family Medicine

## 2022-01-08 VITALS — BP 124/70 | HR 76 | Temp 98.1°F | Resp 16 | Ht 63.0 in | Wt 189.7 lb

## 2022-01-08 DIAGNOSIS — Z0000 Encounter for general adult medical examination without abnormal findings: Secondary | ICD-10-CM

## 2022-01-08 DIAGNOSIS — Z9181 History of falling: Secondary | ICD-10-CM | POA: Diagnosis not present

## 2022-01-08 NOTE — Progress Notes (Signed)
She states she is social and does not feel isolated

## 2022-01-15 DIAGNOSIS — R079 Chest pain, unspecified: Secondary | ICD-10-CM | POA: Diagnosis not present

## 2022-04-01 ENCOUNTER — Telehealth: Payer: Self-pay | Admitting: Family Medicine

## 2022-04-01 NOTE — Telephone Encounter (Signed)
Contacted Kari Lee to schedule their annual wellness visit. Appointment made for 06/26/2022.  [place] Direct Dial: [phone]

## 2022-04-08 NOTE — Progress Notes (Unsigned)
Name: Kari Lee   MRN: [id number]    DOB: 1945/07/30   Date:04/09/2022       Progress Note  Subjective  Chief Complaint  Follow Up  HPI  HTN  She is taking  Losartan, she denies chest pain, palpitation of dizziness. Denies side effects of medication . BP today towards high end of normal and we will recheck it   History of shingles on right arm: she went to Urgent care Sep 2023 , no recurrence seen  Bradycardia: she states her heart rate is always slow and seen by cardiologist - [place]  but negative evaluation. She states usually in the 60's. Today HR is 80    Recent fall: she walks in the woods with her dogs, a few weeks ago she slipped and fell forward , she hit her forehead , she got up and finished her walk but the following day she had some pain that radiated from right wrist to right shoulder, symptoms improved , she only has a mild pain 1/10 on right shoulder, but not affecting her activities of daily living at this time     Overweight: she joined [employer] 05/26/2017, she started at 200.2 lbs and she was down to 173 lbs but during the pandemic it went up to 193 lbs, she was down to 173 lbs during her last visit in March but stopped weight watchers and weight but weight has been stable between 187-189 lbs   Hot Flashes: she saw Dr. Star Age  she was noticing dryness in her face and also hot flashes, She tried to stop but it caused hot flashes ,she is compliant with medications at this time.    Elevated liver enzymes: over 100's , seen by Dr. Durwin Reges, US showed fatty liver, changed diet and lost weight, liver enzymes normalized. She is gaining weight again , she will resume a healthier diet . We will recheck labs    OA: most symptoms are on her hands, but has a history of bilateral  knee replacement and left hip, has DIP deformities she is now on celebrex 200 mg but is having to take Tylenol daily, we switched to celebrex 100 mg BID but she is not sure if the function has  improved of stable, advised to try stopping or skipping doses and try to only take Tylenol    Hyperlipidemia: she is on atorvastatin and is doing well at this time, she denies myalgia. Last level was at goal, triglycerides back to normal. 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 Rudene Christians and referred to Dr. Phyllis Ginger, and had PT and two steroid injection, she states pain going down her legs have resolved. She takes Celebrex and no problems at this time   IMPRESSION: Transitional lumbosacral anatomy. Please see numbering scheme above and correlate with plain films if any intervention is planned.   Disc bulge to the left at L2-3 causes narrowing in the left subarticular recess which could impact the left L3 root. There is also mild left foraminal narrowing at this level.   Disc and endplate spur to the right at L3-4 cause narrowing in the right subarticular recess and could impact the descending right L4 root. Moderate to moderately severe foraminal narrowing at L3-4 is worse on the left.  Cervical myelopathy: seen by Dr. Lacinda Axon back in June 2022 and surgery was discussed versus surveillance She denies any weakness or numbness.She is doing  well at this time.   Patient Active Problem List   Diagnosis Date Noted   Cervical myelopathy ([place]) 11/01/2021   S/P left rotator cuff repair 11/06/2020   Vitamin D deficiency 12/26/2019   DDD (degenerative disc disease), lumbar 12/26/2019   Spinal stenosis of lumbar region without neurogenic claudication 12/26/2019   Osteoarthritis 08/19/2016   Bradycardia 05/20/2016   Current long-term use of postmenopausal hormone replacement therapy 05/20/2016   Hyperlipidemia, unspecified 05/20/2016   Hypertension 05/20/2016   History of hip replacement, total, 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    Past Surgical History:  Procedure Laterality Date   CERVICAL CONE BIOPSY     COLONOSCOPY  10/2010    COLONOSCOPY  2004   endoscopic carpal tunn Bilateral    HALLUX VALGUS CORRECTION Right 2005   HYSTERECTOMY ABDOMINAL WITH SALPINGECTOMY  1981   INCISION TENDON SHEATH HAND Right    JOINT REPLACEMENT Right 11/21/2010   knee   JOINT REPLACEMENT Left 11/04/2011   hip   JOINT REPLACEMENT Left 04/27/2012   knee   KNEE ARTHROSCOPY Right    KNEE ARTHROSCOPY Left 12/18/2011   SHOULDER ARTHROSCOPY WITH SUBACROMIAL DECOMPRESSION, ROTATOR CUFF REPAIR AND BICEP TENDON REPAIR Left 09/20/2020   Procedure: SHOULDER ARTHROSCOPY WITH DEBRIDEMENT, DECOMPRESSION, ROTATOR CUFF REPAIR AND BICEPS TENODESIS;  Surgeon: Corky Mull, MD;  Location: [facility] ORS;  Service: Orthopedics;  Laterality: Left;    Family History  Problem Relation Age of Onset   Alzheimer's disease Mother 42   Osteoporosis Mother    Stroke Father 27   Arthritis Sister    Basal cell carcinoma Sister    Breast cancer Maternal Aunt 80   Macular degeneration Other     Social History   Tobacco Use   Smoking status: Never   Smokeless tobacco: Never  Substance Use Topics   Alcohol use: Yes    Alcohol/week: 1.0 standard drink of alcohol    Types: 1 Glasses of wine per week     Current Outpatient Medications:    acetaminophen (TYLENOL) 500 MG tablet, Take 1 tablet (500 mg total) by mouth 2 (two) times daily., Disp: 60 tablet, Rfl: 0   Ascorbic Acid (VITAMIN C) 1000 MG tablet, Take 1,000 mg by mouth daily., Disp: , Rfl:    atorvastatin (LIPITOR) 40 MG tablet, Take 1 tablet (40 mg total) by mouth daily., Disp: 90 tablet, Rfl: 1   celecoxib (CELEBREX) 100 MG capsule, Take 1 capsule (100 mg total) by mouth 2 (two) times daily., Disp: 180 capsule, Rfl: 1   estradiol (ESTRACE) 0.5 MG tablet, Take 1 tablet (0.5 mg total) by mouth daily., Disp: 90 tablet, Rfl: 3   losartan (COZAAR) 50 MG tablet, Take 1 tablet (50 mg total) by mouth daily., Disp: 90 tablet, Rfl: 1   Multiple Vitamin (MULTIVITAMIN) tablet, Take 1 tablet by mouth daily., Disp:  , Rfl:    Vitamin D, Cholecalciferol, 50 MCG (2000 UT) CAPS, Take 2,000 Units by mouth daily., Disp: , Rfl:   Allergies  Allergen Reactions   Sulfa Antibiotics Nausea Only   Penicillins Rash    TOLERATED CEFAZOLIN    I personally reviewed active problem list, medication list, allergies, family history, social history, health maintenance with the patient/caregiver today.   ROS  Constitutional: Negative for fever or weight change.  Respiratory: Negative for cough and shortness of breath.   Cardiovascular: Negative for chest pain or palpitations.  Gastrointestinal: Negative for abdominal pain, no bowel changes.  Musculoskeletal: Negative for gait problem , positive  joint swelling -  deformities on hands.  Skin: Negative for rash.  Neurological: Negative for dizziness or headache.  No other specific complaints in a complete review of systems (except as listed in HPI above).    Objective  Vitals:   04/09/22 0810  BP: 138/82  Pulse: 80  Resp: 16  SpO2: 98%  Weight: 189 lb (85.7 kg)  Height: '5\' 3"'$  (1.6 m)    Body mass index is 33.48 kg/m.  Physical Exam  Constitutional: Patient appears well-developed and well-nourished. Obese  No distress.  HEENT: head atraumatic, normocephalic, pupils equal and reactive to light, neck supple, Cardiovascular: Normal rate, regular rhythm and normal heart sounds.  No murmur heard. No BLE edema. Pulmonary/Chest: Effort normal and breath sounds normal. No respiratory distress. Abdominal: Soft.  There is no tenderness. Muscular skeletal: finger deformities from OA and also some nodules on right hand  Psychiatric: Patient has a normal mood and affect. behavior is normal. Judgment and thought content normal.   PHQ2/9:    04/09/2022    8:10 AM 01/08/2022    8:22 AM 11/01/2021    1:41 PM 06/20/2021    9:09 AM 04/24/2021    2:36 PM  Depression screen PHQ 2/9  Decreased Interest 0 0 0 0 0  Down, Depressed, Hopeless 0 0 0 0 0  PHQ - 2 Score 0 0 0  0 0  Altered sleeping 0 0 0  0  Tired, decreased energy 0 0 0  0  Change in appetite 0 0 0  0  Feeling bad or failure about yourself  0 0 0  0  Trouble concentrating 0 0 0  0  Moving slowly or fidgety/restless 0 0 0  0  Suicidal thoughts 0 0 0  0  PHQ-9 Score 0 0 0  0    phq 9 is negative   Fall Risk:    04/09/2022    8:10 AM 01/08/2022    8:24 AM 11/01/2021    1:39 PM 06/20/2021    9:12 AM 06/16/2021    5:09 PM  Fall Risk   Falls in the past year? 1 1 0 0 0  Number falls in past yr: 1 0 0 0   Injury with Fall? 1 1 0 0   Risk for fall due to : No Fall Risks History of fall(s) No Fall Risks No Fall Risks   Follow up Falls prevention discussed Falls prevention discussed;Education provided;Falls evaluation completed Falls prevention discussed Falls prevention discussed       Functional Status Survey: Is the patient deaf or have difficulty hearing?: Yes Does the patient have difficulty seeing, even when wearing glasses/contacts?: No Does the patient have difficulty concentrating, remembering, or making decisions?: No Does the patient have difficulty walking or climbing stairs?: No Does the patient have difficulty dressing or bathing?: No Does the patient have difficulty doing errands alone such as visiting a doctor's office or shopping?: No    Assessment & Plan  1. Essential hypertension  - losartan (COZAAR) 50 MG tablet; Take 1 tablet (50 mg total) by mouth daily.  Dispense: 90 tablet; Refill: 1 - COMPLETE METABOLIC PANEL WITH GFR - CBC with Differential/Platelet  2. Cervical myelopathy ([facility])  - celecoxib (CELEBREX) 100 MG capsule; Take 1 capsule (100 mg total) by mouth 2 (two) times daily.  Dispense: 180 capsule; Refill: 1  3. Primary osteoarthritis of both hands  - celecoxib (CELEBREX) 100 MG capsule; Take 1 capsule (100 mg total) by mouth 2 (two) times daily.  Dispense:  180 capsule; Refill: 1  4. GERD without esophagitis  Under control   5. Spinal stenosis of  lumbar region without neurogenic claudication  Stable  6. DDD (degenerative disc disease), lumbar   7. Mixed hyperlipidemia  - atorvastatin (LIPITOR) 40 MG tablet; Take 1 tablet (40 mg total) by mouth daily.  Dispense: 90 tablet; Refill: 1 - Lipid panel  8. History of recent fall   9. Vitamin D deficiency  - VITAMIN D 25 Hydroxy (Vit-D Deficiency, Fractures)

## 2022-04-09 ENCOUNTER — Encounter: Payer: Self-pay | Admitting: Family Medicine

## 2022-04-09 ENCOUNTER — Ambulatory Visit (INDEPENDENT_AMBULATORY_CARE_PROVIDER_SITE_OTHER): Payer: Medicare HMO | Admitting: Family Medicine

## 2022-04-09 VITALS — BP 128/74 | HR 80 | Resp 16 | Ht 63.0 in | Wt 189.0 lb

## 2022-04-09 DIAGNOSIS — G959 Disease of spinal cord, unspecified: Secondary | ICD-10-CM | POA: Diagnosis not present

## 2022-04-09 DIAGNOSIS — E559 Vitamin D deficiency, unspecified: Secondary | ICD-10-CM | POA: Diagnosis not present

## 2022-04-09 DIAGNOSIS — Z9181 History of falling: Secondary | ICD-10-CM

## 2022-04-09 DIAGNOSIS — M48061 Spinal stenosis, lumbar region without neurogenic claudication: Secondary | ICD-10-CM | POA: Diagnosis not present

## 2022-04-09 DIAGNOSIS — I10 Essential (primary) hypertension: Secondary | ICD-10-CM | POA: Diagnosis not present

## 2022-04-09 DIAGNOSIS — K219 Gastro-esophageal reflux disease without esophagitis: Secondary | ICD-10-CM | POA: Diagnosis not present

## 2022-04-09 DIAGNOSIS — M19041 Primary osteoarthritis, right hand: Secondary | ICD-10-CM | POA: Diagnosis not present

## 2022-04-09 DIAGNOSIS — E782 Mixed hyperlipidemia: Secondary | ICD-10-CM | POA: Diagnosis not present

## 2022-04-09 DIAGNOSIS — M19042 Primary osteoarthritis, left hand: Secondary | ICD-10-CM | POA: Diagnosis not present

## 2022-04-09 DIAGNOSIS — M5136 Other intervertebral disc degeneration, lumbar region: Secondary | ICD-10-CM | POA: Diagnosis not present

## 2022-04-09 MED ORDER — CELECOXIB 100 MG PO CAPS: 100.0000 mg | ORAL_CAPSULE | Freq: Two times a day (BID) | ORAL | 1 refills | Status: DC

## 2022-04-09 MED ORDER — ATORVASTATIN CALCIUM 40 MG PO TABS: 40.0000 mg | ORAL_TABLET | Freq: Every day | ORAL | 1 refills | Status: DC

## 2022-04-09 MED ORDER — LOSARTAN POTASSIUM 50 MG PO TABS: 50.0000 mg | ORAL_TABLET | Freq: Every day | ORAL | 1 refills | Status: DC

## 2022-04-10 LAB — COMPLETE METABOLIC PANEL WITH GFR
AG Ratio: 1.7 (calc) (ref 1.0–2.5)
ALT: 23 U/L (ref 6–29)
AST: 25 U/L (ref 10–35)
Albumin: 4.5 g/dL (ref 3.6–5.1)
Alkaline phosphatase (APISO): 64 U/L (ref 37–153)
BUN: 18 mg/dL (ref 7–25)
CO2: 27 mmol/L (ref 20–32)
Calcium: 10.2 mg/dL (ref 8.6–10.4)
Chloride: 102 mmol/L (ref 98–110)
Creat: 0.71 mg/dL (ref 0.60–1.00)
Globulin: 2.7 g/dL (calc) (ref 1.9–3.7)
Glucose, Bld: 98 mg/dL (ref 65–99)
Potassium: 4.7 mmol/L (ref 3.5–5.3)
Sodium: 141 mmol/L (ref 135–146)
Total Bilirubin: 1.1 mg/dL (ref 0.2–1.2)
Total Protein: 7.2 g/dL (ref 6.1–8.1)
eGFR: 89 mL/min/{1.73_m2} (ref >=60)

## 2022-04-10 LAB — CBC WITH DIFFERENTIAL/PLATELET
Absolute Monocytes: 614 cells/uL (ref 200–950)
Basophils Absolute: 48 cells/uL (ref 0–200)
Basophils Relative: 0.7 %
Eosinophils Absolute: 221 cells/uL (ref 15–500)
Eosinophils Relative: 3.2 %
HCT: 47.7 % — ABNORMAL HIGH (ref 35.0–45.0)
Hemoglobin: 16.2 g/dL — ABNORMAL HIGH (ref 11.7–15.5)
Lymphs Abs: 1801 cells/uL (ref 850–3900)
MCH: 30.9 pg (ref 27.0–33.0)
MCHC: 34 g/dL (ref 32.0–36.0)
MCV: 90.9 fL (ref 80.0–100.0)
MPV: 11 fL (ref 7.5–12.5)
Monocytes Relative: 8.9 %
Neutro Abs: 4216 cells/uL (ref 1500–7800)
Neutrophils Relative %: 61.1 %
Platelets: 191 10*3/uL (ref 140–400)
RBC: 5.25 10*6/uL — ABNORMAL HIGH (ref 3.80–5.10)
RDW: 12.8 % (ref 11.0–15.0)
Total Lymphocyte: 26.1 %
WBC: 6.9 10*3/uL (ref 3.8–10.8)

## 2022-04-10 LAB — LIPID PANEL
Cholesterol: 180 mg/dL (ref <200)
HDL: 80 mg/dL (ref >=50)
LDL Cholesterol (Calc): 77 mg/dL (calc)
Non-HDL Cholesterol (Calc): 100 mg/dL (calc) (ref <130)
Total CHOL/HDL Ratio: 2.3 (calc) (ref <5.0)
Triglycerides: 136 mg/dL (ref <150)

## 2022-04-10 LAB — VITAMIN D 25 HYDROXY (VIT D DEFICIENCY, FRACTURES): Vit D, 25-Hydroxy: 53 ng/mL (ref 30–100)

## 2022-04-28 ENCOUNTER — Other Ambulatory Visit: Payer: Self-pay | Admitting: Family Medicine

## 2022-04-28 DIAGNOSIS — M19042 Primary osteoarthritis, left hand: Secondary | ICD-10-CM

## 2022-04-28 DIAGNOSIS — G959 Disease of spinal cord, unspecified: Secondary | ICD-10-CM

## 2022-05-06 NOTE — Progress Notes (Unsigned)
Name: Kari Lee   MRN: [id number]    DOB: 06-22-1945   Date:05/06/2022       Progress Note  Subjective  Chief Complaint  Follow Up  HPI  HTN  She is taking  Losartan, she denies chest pain, palpitation of dizziness. Denies side effects of medication . BP today towards high end of normal and we will recheck it   History of shingles on right arm: she went to Urgent care Sep 2023 , no recurrence seen  Bradycardia: she states her heart rate is always slow and seen by cardiologist - [place]  but negative evaluation. She states usually in the 60's. Today HR is 80    Recent fall: she walks in the woods with her dogs, a few weeks ago she slipped and fell forward , she hit her forehead , she got up and finished her walk but the following day she had some pain that radiated from right wrist to right shoulder, symptoms improved , she only has a mild pain 1/10 on right shoulder, but not affecting her activities of daily living at this time     Overweight: she joined [employer] 05/26/2017, she started at 200.2 lbs and she was down to 173 lbs but during the pandemic it went up to 193 lbs, she was down to 173 lbs during her last visit in March but stopped weight watchers and weight but weight has been stable between 187-189 lbs   Hot Flashes: she saw Dr. Chauncey Cruel  she was noticing dryness in her face and also hot flashes, She tried to stop but it caused hot flashes ,she is compliant with medications at this time.    Elevated liver enzymes: over 100's , seen by Dr. Lars Pinks, US showed fatty liver, changed diet and lost weight, liver enzymes normalized. She is gaining weight again , she will resume a healthier diet . We will recheck labs    OA: most symptoms are on her hands, but has a history of bilateral  knee replacement and left hip, has DIP deformities she is now on celebrex 200 mg but is having to take Tylenol daily, we switched to celebrex 100 mg BID but she is not sure if the function has  improved of stable, advised to try stopping or skipping doses and try to only take Tylenol    Hyperlipidemia: she is on atorvastatin and is doing well at this time, she denies myalgia. Last level was at goal, triglycerides back to normal. 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 Rosita Kea and referred to Dr. Council Mechanic, and had PT and two steroid injection, she states pain going down her legs have resolved. She takes Celebrex and no problems at this time   IMPRESSION: Transitional lumbosacral anatomy. Please see numbering scheme above and correlate with plain films if any intervention is planned.   Disc bulge to the left at L2-3 causes narrowing in the left subarticular recess which could impact the left L3 root. There is also mild left foraminal narrowing at this level.   Disc and endplate spur to the right at L3-4 cause narrowing in the right subarticular recess and could impact the descending right L4 root. Moderate to moderately severe foraminal narrowing at L3-4 is worse on the left.  Cervical myelopathy: seen by Dr. Adriana Simas back in June 2022 and surgery was discussed versus surveillance She denies any weakness or numbness.She is doing  well at this time.   Patient Active Problem List   Diagnosis Date Noted   Cervical myelopathy 11/01/2021   S/P left rotator cuff repair 11/06/2020   Vitamin D deficiency 12/26/2019   DDD (degenerative disc disease), lumbar 12/26/2019   Spinal stenosis of lumbar region without neurogenic claudication 12/26/2019   Osteoarthritis 08/19/2016   Bradycardia 05/20/2016   Current long-term use of postmenopausal hormone replacement therapy 05/20/2016   GERD without esophagitis 05/20/2016   Hyperlipidemia, unspecified 05/20/2016   Essential hypertension 05/20/2016   History of hip replacement, total, 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    Past Surgical History:  Procedure Laterality Date   CERVICAL CONE  BIOPSY     COLONOSCOPY  10/2010   COLONOSCOPY  2004   endoscopic carpal tunn Bilateral    HALLUX VALGUS CORRECTION Right 2005   HYSTERECTOMY ABDOMINAL WITH SALPINGECTOMY  1981   INCISION TENDON SHEATH HAND Right    JOINT REPLACEMENT Right 11/21/2010   knee   JOINT REPLACEMENT Left 11/04/2011   hip   JOINT REPLACEMENT Left 04/27/2012   knee   KNEE ARTHROSCOPY Right    KNEE ARTHROSCOPY Left 12/18/2011   SHOULDER ARTHROSCOPY WITH SUBACROMIAL DECOMPRESSION, ROTATOR CUFF REPAIR AND BICEP TENDON REPAIR Left 09/20/2020   Procedure: SHOULDER ARTHROSCOPY WITH DEBRIDEMENT, DECOMPRESSION, ROTATOR CUFF REPAIR AND BICEPS TENODESIS;  Surgeon: Christena Flake, MD;  Location: [facility] ORS;  Service: Orthopedics;  Laterality: Left;    Family History  Problem Relation Age of Onset   Alzheimer's disease Mother 96   Osteoporosis Mother    Stroke Father 57   Arthritis Sister    Basal cell carcinoma Sister    Breast cancer Maternal Aunt 63   Macular degeneration Other     Social History   Tobacco Use   Smoking status: Never   Smokeless tobacco: Never  Substance Use Topics   Alcohol use: Yes    Alcohol/week: 1.0 standard drink of alcohol    Types: 1 Glasses of wine per week     Current Outpatient Medications:    acetaminophen (TYLENOL) 500 MG tablet, Take 1 tablet (500 mg total) by mouth 2 (two) times daily., Disp: 60 tablet, Rfl: 0   Ascorbic Acid (VITAMIN C) 1000 MG tablet, Take 1,000 mg by mouth daily., Disp: , Rfl:    atorvastatin (LIPITOR) 40 MG tablet, Take 1 tablet (40 mg total) by mouth daily., Disp: 90 tablet, Rfl: 1   celecoxib (CELEBREX) 100 MG capsule, TAKE 1 CAPSULE BY MOUTH TWICE A DAY, Disp: 180 capsule, Rfl: 0   estradiol (ESTRACE) 0.5 MG tablet, Take 1 tablet (0.5 mg total) by mouth daily., Disp: 90 tablet, Rfl: 3   losartan (COZAAR) 50 MG tablet, Take 1 tablet (50 mg total) by mouth daily., Disp: 90 tablet, Rfl: 1   Multiple Vitamin (MULTIVITAMIN) tablet, Take 1 tablet by mouth  daily., Disp: , Rfl:    Vitamin D, Cholecalciferol, 50 MCG (2000 UT) CAPS, Take 2,000 Units by mouth daily., Disp: , Rfl:   Allergies  Allergen Reactions   Sulfa Antibiotics Nausea Only   Penicillins Rash    TOLERATED CEFAZOLIN    I personally reviewed active problem list, medication list, allergies, family history, social history, health maintenance with the patient/caregiver today.   ROS  ***  Objective  There were no vitals filed for this visit.  There is no height or weight on file to calculate BMI.  Physical Exam ***   PHQ2/9:    04/09/2022    8:10 AM 01/08/2022  8:22 AM 11/01/2021    1:41 PM 06/20/2021    9:09 AM 04/24/2021    2:36 PM  Depression screen PHQ 2/9  Decreased Interest 0 0 0 0 0  Down, Depressed, Hopeless 0 0 0 0 0  PHQ - 2 Score 0 0 0 0 0  Altered sleeping 0 0 0  0  Tired, decreased energy 0 0 0  0  Change in appetite 0 0 0  0  Feeling bad or failure about yourself  0 0 0  0  Trouble concentrating 0 0 0  0  Moving slowly or fidgety/restless 0 0 0  0  Suicidal thoughts 0 0 0  0  PHQ-9 Score 0 0 0  0    phq 9 is {gen pos [id number]}neg:[id number]}   Fall Risk:    04/09/2022    8:10 AM 01/08/2022    8:24 AM 11/01/2021    1:39 PM 06/20/2021    9:12 AM 06/16/2021    5:09 PM  Fall Risk   Falls in the past year? 1 1 0 0 0  Number falls in past yr: 1 0 0 0   Injury with Fall? 1 1 0 0   Risk for fall due to : No Fall Risks History of fall(s) No Fall Risks No Fall Risks   Follow up Falls prevention discussed Falls prevention discussed;Education provided;Falls evaluation completed Falls prevention discussed Falls prevention discussed       Functional Status Survey:      Assessment & Plan  *** There are no diagnoses linked to this encounter.

## 2022-05-07 ENCOUNTER — Ambulatory Visit (INDEPENDENT_AMBULATORY_CARE_PROVIDER_SITE_OTHER): Payer: Medicare HMO | Admitting: Family Medicine

## 2022-05-07 ENCOUNTER — Encounter: Payer: Self-pay | Admitting: Family Medicine

## 2022-05-07 VITALS — BP 130/78 | HR 73 | Temp 97.8°F | Resp 16 | Ht 63.0 in | Wt 189.6 lb

## 2022-05-07 DIAGNOSIS — R718 Other abnormality of red blood cells: Secondary | ICD-10-CM

## 2022-05-07 DIAGNOSIS — I10 Essential (primary) hypertension: Secondary | ICD-10-CM | POA: Diagnosis not present

## 2022-05-07 DIAGNOSIS — E782 Mixed hyperlipidemia: Secondary | ICD-10-CM

## 2022-06-18 DIAGNOSIS — H5213 Myopia, bilateral: Secondary | ICD-10-CM | POA: Diagnosis not present

## 2022-06-26 ENCOUNTER — Other Ambulatory Visit: Payer: Self-pay | Admitting: Family Medicine

## 2022-06-26 ENCOUNTER — Ambulatory Visit (INDEPENDENT_AMBULATORY_CARE_PROVIDER_SITE_OTHER): Payer: Medicare HMO

## 2022-06-26 VITALS — Ht 63.0 in | Wt 189.0 lb

## 2022-06-26 DIAGNOSIS — R232 Flushing: Secondary | ICD-10-CM

## 2022-06-26 DIAGNOSIS — Z0000 Encounter for general adult medical examination without abnormal findings: Secondary | ICD-10-CM

## 2022-06-26 NOTE — Progress Notes (Signed)
I connected with  Kari Lee on 06/26/22 by a audio enabled telemedicine application and verified that I am speaking with the correct person using two identifiers.  Patient Location: Home  Provider Location: Office/Clinic  I discussed the limitations of evaluation and management by telemedicine. The patient expressed understanding and agreed to proceed.  Subjective:   Kari Lee is a 77 y.o. female who presents for Medicare Annual (Subsequent) preventive examination.  Review of Systems    Cardiac Risk Factors include: advanced age (>32men, >71 women);hypertension;dyslipidemia;obesity (BMI >30kg/m2);sedentary lifestyle    Objective:    Today's Vitals   06/25/22 2335 06/26/22 1046  Weight:  189 lb (85.7 kg)  Height:  5\' 3"  (1.6 m)  PainSc: 5     Body mass index is 33.48 kg/m.     06/26/2022   10:58 AM 06/20/2021    9:11 AM 09/20/2020    9:30 AM 09/11/2020   11:06 AM 06/12/2020    8:34 AM 05/17/2019    8:49 AM 02/19/2018    9:24 AM  Advanced Directives  Does Patient Have a Medical Advance Directive? No Yes No No No Yes Yes  Type of Furniture conservator/restorer;Living will    Healthcare Power of [place];Living will Healthcare Power of [place];Living will  Does patient want to make changes to medical advance directive?      Yes (MAU/Ambulatory/Procedural Areas - Information given)   Copy of Healthcare Power of Attorney in Chart?  No - copy requested    No - copy requested No - copy requested  Would patient like information on creating a medical advance directive?   No - Patient declined  Yes (MAU/Ambulatory/Procedural Areas - Information given)      Current Medications (verified) Outpatient Encounter Medications as of 06/26/2022  Medication Sig   acetaminophen (TYLENOL) 500 MG tablet Take 1 tablet (500 mg total) by mouth 2 (two) times daily.   Ascorbic Acid (VITAMIN C) 1000 MG tablet Take 1,000 mg by mouth daily.   atorvastatin (LIPITOR) 40 MG tablet  Take 1 tablet (40 mg total) by mouth daily.   celecoxib (CELEBREX) 100 MG capsule TAKE 1 CAPSULE BY MOUTH TWICE A DAY   estradiol (ESTRACE) 0.5 MG tablet TAKE 1 TABLET BY MOUTH EVERY DAY   losartan (COZAAR) 50 MG tablet Take 1 tablet (50 mg total) by mouth daily.   Multiple Vitamin (MULTIVITAMIN) tablet Take 1 tablet by mouth daily.   Vitamin D, Cholecalciferol, 50 MCG (2000 UT) CAPS Take 2,000 Units by mouth daily.   No facility-administered encounter medications on file as of 06/26/2022.    Allergies (verified) Sulfa antibiotics and Penicillins   History: Past Medical History:  Diagnosis Date   Anxiety    Arthritis    Bradycardia    Bronchitis    Complication of anesthesia    woke up during hip surgery   Dysrhythmia    congenital bradycardia   GERD (gastroesophageal reflux disease)    History of positive PPD 2009   History of positive PPD 2009   INH through health dept CXR's needed for clearance   Hyperlipidemia    Hypertension    Insomnia    Lumbar spondylosis    Managed by Dr. Kennedy Bucker with [facility]   Obesity    Rosacea    Spinal stenosis    Spondylosis of cervical spine    Past Surgical History:  Procedure Laterality Date   CERVICAL CONE BIOPSY     COLONOSCOPY  10/2010  COLONOSCOPY  2004   endoscopic carpal tunn Bilateral    HALLUX VALGUS CORRECTION Right 2005   HYSTERECTOMY ABDOMINAL WITH SALPINGECTOMY  1981   INCISION TENDON SHEATH HAND Right    JOINT REPLACEMENT Right 11/21/2010   knee   JOINT REPLACEMENT Left 11/04/2011   hip   JOINT REPLACEMENT Left 04/27/2012   knee   KNEE ARTHROSCOPY Right    KNEE ARTHROSCOPY Left 12/18/2011   SHOULDER ARTHROSCOPY WITH SUBACROMIAL DECOMPRESSION, ROTATOR CUFF REPAIR AND BICEP TENDON REPAIR Left 09/20/2020   Procedure: SHOULDER ARTHROSCOPY WITH DEBRIDEMENT, DECOMPRESSION, ROTATOR CUFF REPAIR AND BICEPS TENODESIS;  Surgeon: Christena Flake, MD;  Location: [facility] OR[facility];  Service: Orthopedics;  Laterality: Left;   Family  History  Problem Relation Age of Onset   Alzheimer's disease Mother 33   Osteoporosis Mother    Stroke Father 73   Arthritis Sister    Basal cell carcinoma Sister    Breast cancer Maternal Aunt 35   Macular degeneration Other    Social History   Socioeconomic History   Marital status: Married    Spouse name: Maisie Fus   Number of children: 2   Years of education: Not on file   Highest education level: Associate degree: academic program  Occupational History   Occupation: Retired    Comment: Engineer, civil (consulting)  Tobacco Use   Smoking status: Never   Smokeless tobacco: Never  Vaping Use   Vaping Use: Never used  Substance and Sexual Activity   Alcohol use: Yes    Alcohol/week: 1.0 standard drink of alcohol    Types: 1 Glasses of wine per week   Drug use: No   Sexual activity: Yes    Partners: Male    Birth control/protection: None, Surgical, Post-menopausal  Other Topics Concern   Not on file  Social History Narrative   She moved to [place] to downsize, but decided to move back to [place] after 2 years.    Married.   One daughter lives in [place].    The other daughter in [place].   Social Determinants of Health   Financial Resource Strain: Low Risk  (06/25/2022)   Overall Financial Resource Strain (CARDIA)    Difficulty of Paying Living Expenses: Not hard at all  Food Insecurity: No Food Insecurity (06/25/2022)   Hunger Vital Sign    Worried About Running Out of Food in the Last Year: Never true    Ran Out of Food in the Last Year: Never true  Transportation Needs: No Transportation Needs (06/25/2022)   PRAPARE - Administrator, Civil Service (Medical): No    Lack of Transportation (Non-Medical): No  Physical Activity: Inactive (06/25/2022)   Exercise Vital Sign    Days of Exercise per Week: 0 days    Minutes of Exercise per Session: 0 min  Stress: No Stress Concern Present (06/25/2022)   [employer] of Occupational Health - Occupational Stress  Questionnaire    Feeling of Stress : Not at all  Social Connections: Unknown (06/25/2022)   Social Connection and Isolation Panel [NHANES]    Frequency of Communication with Friends and Family: Patient declined    Frequency of Social Gatherings with Friends and Family: Patient declined    Attends Religious Services: Patient declined    Database administrator or Organizations: Patient declined    Attends Banker Meetings: Patient declined    Marital Status: Married    Tobacco Counseling Counseling given: Not Answered   Clinical Intake:  Pre-visit preparation completed: Yes  Pain : 0-10 Pain Score: 5  Pain Type: Chronic pain Pain Location: Hand (both) Pain Descriptors / Indicators: Aching, Sharp (swelling and stiff) Pain Onset: More than a month ago Pain Frequency: Constant Pain Relieving Factors: Tylenol, NSAIDS; voltaren gel Effect of Pain on Daily Activities: limited with hands:writing and holding things more difficult; weakness  Pain Relieving Factors: Tylenol, NSAIDS; voltaren gel  BMI - recorded: 33.48 Nutritional Status: BMI > 30  Obese Nutritional Risks: None Diabetes: No  How often do you need to have someone help you when you read instructions, pamphlets, or other written materials from your doctor or pharmacy?: (P) 1 - Never  Diabetic?no  Interpreter Needed?: No  Comments: lives with husband Information entered by :: B.Danna Casella,LPN   Activities of Daily Living    06/25/2022   11:35 PM 05/07/2022    1:53 PM  In your present state of health, do you have any difficulty performing the following activities:  Hearing? 0 1  Vision? 0 0  Difficulty concentrating or making decisions? 0 0  Walking or climbing stairs? 0 0  Dressing or bathing? 0 0  Doing errands, shopping? 0 0  Preparing Food and eating ? N   Using the Toilet? N   In the past six months, have you accidently leaked urine? Y   Do you have problems with loss of bowel control? N    Managing your Medications? N   Managing your Finances? N   Housekeeping or managing your Housekeeping? N     Patient Care Team: Alba Cory, MD as PCP - General (Family Medicine)  Indicate any recent Medical Services you may have received from other than [facility] providers in the past year (date may be approximate).     Assessment:   This is a routine wellness examination for Kari Lee.  Hearing/Vision screen Hearing Screening - Comments:: Adequate hearing:small hearing loss Vision Screening - Comments:: Adequate vision w/contacts Dr Roberto Scales vision  Dietary issues and exercise activities discussed: Current Exercise Habits: The patient does not participate in regular exercise at present, Exercise limited by: orthopedic condition(s)   Goals Addressed             This Visit's Progress    DIET - INCREASE WATER INTAKE   On track    Recommend drinking 6-8 glasses of water per day        Depression Screen    06/26/2022   10:57 AM 05/07/2022    1:53 PM 04/09/2022    8:10 AM 01/08/2022    8:22 AM 11/01/2021    1:41 PM 06/20/2021    9:09 AM 04/24/2021    2:36 PM  PHQ 2/9 Scores  PHQ - 2 Score 0 0 0 0 0 0 0  PHQ- 9 Score  0 0 0 0  0    Fall Risk    06/25/2022   11:35 PM 05/07/2022    1:53 PM 04/09/2022    8:10 AM 01/08/2022    8:24 AM 11/01/2021    1:39 PM  Fall Risk   Falls in the past year? 1 1 1 1  0  Number falls in past yr: 1 1 1  0 0  Injury with Fall? 1 1 1 1  0  Risk for fall due to :  History of fall(s) No Fall Risks History of fall(s) No Fall Risks  Follow up Education provided;Falls prevention discussed Falls prevention discussed;Education provided;Falls evaluation completed Falls prevention discussed Falls prevention discussed;Education provided;Falls evaluation completed Falls prevention discussed    FALL RISK  PREVENTION PERTAINING TO THE HOME:  Any stairs in or around the home? Yes  If so, are there any without handrails? Yes  Home free of loose  throw rugs in walkways, pet beds, electrical cords, etc? Yes  Adequate lighting in your home to reduce risk of falls? Yes   ASSISTIVE DEVICES UTILIZED TO PREVENT FALLS:  Life alert? No  Use of a cane, walker or w/c? No  Grab bars in the bathroom? Yes  Shower chair or bench in shower? No  Elevated toilet seat or a handicapped toilet? Yes    Cognitive Function:        06/26/2022   11:08 AM 02/19/2018    9:38 AM  6CIT Screen  What Year? 0 points 0 points  What month? 0 points 0 points  What time? 0 points 0 points  Count back from 20 0 points 0 points  Months in reverse 0 points 0 points  Repeat phrase 0 points 2 points  Total Score 0 points 2 points    Immunizations Immunization History  Administered Date(s) Administered   Fluad Quad(high Dose 65+) 10/29/2018, 11/06/2020   Influenza, High Dose Seasonal PF 11/19/2016, 10/20/2017   Influenza,inj,Quad PF,6+ Mos 12/26/2019   Influenza-Unspecified 01/05/2015   PFIZER(Purple Top)SARS-COV-2 Vaccination 03/09/2019, 03/30/2019   Pneumococcal Conjugate-13 07/03/2014   Pneumococcal Polysaccharide-23 11/03/2011, 11/19/2016   Td 08/13/2009   Tdap 12/26/2019   Zoster, Live 02/19/2012    TDAP status: Up to date  Flu Vaccine status: Up to date  Pneumococcal vaccine status: Up to date  Covid-19 vaccine status: Completed vaccines  Qualifies for Shingles Vaccine? Yes   Zostavax completed No   Shingrix Completed?: No.    Education has been provided regarding the importance of this vaccine. Patient has been advised to call insurance company to determine out of pocket expense if they have not yet received this vaccine. Advised may also receive vaccine at local pharmacy or Health Dept. Verbalized acceptance and understanding.  Screening Tests Health Maintenance  Topic Date Due   COVID-19 Vaccine (3 - 2023-24 season) 09/27/2021   Zoster Vaccines- Shingrix (1 of 2) 07/10/2022 (Originally 07/20/1997)   INFLUENZA VACCINE  08/28/2022    MAMMOGRAM  09/03/2022   Medicare Annual Wellness (AWV)  06/26/2023   Fecal DNA (Cologuard)  05/07/2024   DTaP/Tdap/Td (3 - Td or Tdap) 12/25/2029   Pneumonia Vaccine 53+ Years old  Completed   DEXA SCAN  Completed   Hepatitis C Screening  Completed   HPV VACCINES  Aged Out   Colonoscopy  Discontinued    Health Maintenance  Health Maintenance Due  Topic Date Due   COVID-19 Vaccine (3 - 2023-24 season) 09/27/2021    Colorectal cancer screening: Type of screening: Colonoscopy. Completed yes. Repeat every 5 years  Mammogram status: Completed yes. Repeat every year  Bone Density status: Completed yes. Results reflect: Bone density results: NORMAL. Repeat every 5 years.  Lung Cancer Screening: (Low Dose CT Chest recommended if Age 35-80 years, 30 pack-year currently smoking OR have quit w/in 15years.) does not qualify.   Lung Cancer Screening Referral: no  Additional Screening:  Hepatitis C Screening: does not qualify; Completed yes  Vision Screening: Recommended annual ophthalmology exams for early detection of glaucoma and other disorders of the eye. Is the patient up to date with their annual eye exam?  Yes  Who is the provider or what is the name of the office in which the patient attends annual eye exams? Dr Hartley Barefoot If pt is not established  with a provider, would they like to be referred to a provider to establish care? No .   Dental Screening: Recommended annual dental exams for proper oral hygiene  Community Resource Referral / Chronic Care Management: CRR required this visit?  No   CCM required this visit?  No      Plan:     I have personally reviewed and noted the following in the patient's chart:   Medical and social history Use of alcohol, tobacco or illicit drugs  Current medications and supplements including opioid prescriptions. Patient is not currently taking opioid prescriptions. Functional ability and status Nutritional status Physical  activity Advanced directives List of other physicians Hospitalizations, surgeries, and ER visits in previous 12 months Vitals Screenings to include cognitive, depression, and falls Referrals and appointments  In addition, I have reviewed and discussed with patient certain preventive protocols, quality metrics, and best practice recommendations. A written personalized care plan for preventive services as well as general preventive health recommendations were provided to patient.     Sue Lush, LPN   [id number]/[id number]   Nurse Notes: pt states she is doing well other than experiencing lots of swelling, pain and stiffness in both her hands. Pt has an app[facility] at [facility] on 06/30/22 for evaluation.

## 2022-06-26 NOTE — Patient Instructions (Signed)
Kari Lee , Thank you for taking time to come for your Medicare Wellness Visit. I appreciate your ongoing commitment to your health goals. Please review the following plan we discussed and let me know if I can assist you in the future.   These are the goals we discussed:  Goals      DIET - INCREASE WATER INTAKE     Recommend drinking 6-8 glasses of water per day      Weight (lb) < 200 lb (90.7 kg)     Pt would like to lose 20 lbs this year and increase physical activity.         This is a list of the screening recommended for you and due dates:  Health Maintenance  Topic Date Due   COVID-19 Vaccine (3 - 2023-24 season) 09/27/2021   Zoster (Shingles) Vaccine (1 of 2) 07/10/2022*   Flu Shot  08/28/2022   Mammogram  09/03/2022   Medicare Annual Wellness Visit  06/26/2023   Cologuard (Stool DNA test)  05/07/2024   DTaP/Tdap/Td vaccine (3 - Td or Tdap) 12/25/2029   Pneumonia Vaccine  Completed   DEXA scan (bone density measurement)  Completed   Hepatitis C Screening  Completed   HPV Vaccine  Aged Out   Colon Cancer Screening  Discontinued  *Topic was postponed. The date shown is not the original due date.    Advanced directives: no  Conditions/risks identified: none  Next appointment: Follow up in one year for your annual wellness visit 07/02/2023 @10 :15am telephone   Preventive Care 65 Years and Older, Female Preventive care refers to lifestyle choices and visits with your health care provider that can promote health and wellness. What does preventive care include? A yearly physical exam. This is also called an annual well check. Dental exams once or twice a year. Routine eye exams. Ask your health care provider how often you should have your eyes checked. Personal lifestyle choices, including: Daily care of your teeth and gums. Regular physical activity. Eating a healthy diet. Avoiding tobacco and drug use. Limiting alcohol use. Practicing safe sex. Taking low-dose  aspirin every day. Taking vitamin and mineral supplements as recommended by your health care provider. What happens during an annual well check? The services and screenings done by your health care provider during your annual well check will depend on your age, overall health, lifestyle risk factors, and family history of disease. Counseling  Your health care provider may ask you questions about your: Alcohol use. Tobacco use. Drug use. Emotional well-being. Home and relationship well-being. Sexual activity. Eating habits. History of falls. Memory and ability to understand (cognition). Work and work Astronomer. Reproductive health. Screening  You may have the following tests or measurements: Height, weight, and BMI. Blood pressure. Lipid and cholesterol levels. These may be checked every 5 years, or more frequently if you are over 3 years old. Skin check. Lung cancer screening. You may have this screening every year starting at age 76 if you have a 30-pack-year history of smoking and currently smoke or have quit within the past 15 years. Fecal occult blood test (FOBT) of the stool. You may have this test every year starting at age 58. Flexible sigmoidoscopy or colonoscopy. You may have a sigmoidoscopy every 5 years or a colonoscopy every 10 years starting at age 13. Hepatitis C blood test. Hepatitis B blood test. Sexually transmitted disease (STD) testing. Diabetes screening. This is done by checking your blood sugar (glucose) after you have not eaten for  a while (fasting). You may have this done every 1-3 years. Bone density scan. This is done to screen for osteoporosis. You may have this done starting at age 2. Mammogram. This may be done every 1-2 years. Talk to your health care provider about how often you should have regular mammograms. Talk with your health care provider about your test results, treatment options, and if necessary, the need for more tests. Vaccines  Your  health care provider may recommend certain vaccines, such as: Influenza vaccine. This is recommended every year. Tetanus, diphtheria, and acellular pertussis (Tdap, Td) vaccine. You may need a Td booster every 10 years. Zoster vaccine. You may need this after age 68. Pneumococcal 13-valent conjugate (PCV13) vaccine. One dose is recommended after age 21. Pneumococcal polysaccharide (PPSV23) vaccine. One dose is recommended after age 33. Talk to your health care provider about which screenings and vaccines you need and how often you need them. This information is not intended to replace advice given to you by your health care provider. Make sure you discuss any questions you have with your health care provider. Document Released: 02/09/2015 Document Revised: 10/03/2015 Document Reviewed: 11/14/2014 Elsevier Interactive Patient Education  2017 [employer].  Fall Prevention in the Home Falls can cause injuries. They can happen to people of all ages. There are many things you can do to make your home safe and to help prevent falls. What can I do on the outside of my home? Regularly fix the edges of walkways and driveways and fix any cracks. Remove anything that might make you trip as you walk through a door, such as a raised step or threshold. Trim any bushes or trees on the path to your home. Use bright outdoor lighting. Clear any walking paths of anything that might make someone trip, such as rocks or tools. Regularly check to see if handrails are loose or broken. Make sure that both sides of any steps have handrails. Any raised decks and porches should have guardrails on the edges. Have any leaves, snow, or ice cleared regularly. Use sand or salt on walking paths during winter. Clean up any spills in your garage right away. This includes oil or grease spills. What can I do in the bathroom? Use night lights. Install grab bars by the toilet and in the tub and shower. Do not use towel bars as  grab bars. Use non-skid mats or decals in the tub or shower. If you need to sit down in the shower, use a plastic, non-slip stool. Keep the floor dry. Clean up any water that spills on the floor as soon as it happens. Remove soap buildup in the tub or shower regularly. Attach bath mats securely with double-sided non-slip rug tape. Do not have throw rugs and other things on the floor that can make you trip. What can I do in the bedroom? Use night lights. Make sure that you have a light by your bed that is easy to reach. Do not use any sheets or blankets that are too big for your bed. They should not hang down onto the floor. Have a firm chair that has side arms. You can use this for support while you get dressed. Do not have throw rugs and other things on the floor that can make you trip. What can I do in the kitchen? Clean up any spills right away. Avoid walking on wet floors. Keep items that you use a lot in easy-to-reach places. If you need to reach something above  you, use a strong step stool that has a grab bar. Keep electrical cords out of the way. Do not use floor polish or wax that makes floors slippery. If you must use wax, use non-skid floor wax. Do not have throw rugs and other things on the floor that can make you trip. What can I do with my stairs? Do not leave any items on the stairs. Make sure that there are handrails on both sides of the stairs and use them. Fix handrails that are broken or loose. Make sure that handrails are as long as the stairways. Check any carpeting to make sure that it is firmly attached to the stairs. Fix any carpet that is loose or worn. Avoid having throw rugs at the top or bottom of the stairs. If you do have throw rugs, attach them to the floor with carpet tape. Make sure that you have a light switch at the top of the stairs and the bottom of the stairs. If you do not have them, ask someone to add them for you. What else can I do to help prevent  falls? Wear shoes that: Do not have high heels. Have rubber bottoms. Are comfortable and fit you well. Are closed at the toe. Do not wear sandals. If you use a stepladder: Make sure that it is fully opened. Do not climb a closed stepladder. Make sure that both sides of the stepladder are locked into place. Ask someone to hold it for you, if possible. Clearly mark and make sure that you can see: Any grab bars or handrails. First and last steps. Where the edge of each step is. Use tools that help you move around (mobility aids) if they are needed. These include: Canes. Walkers. Scooters. Crutches. Turn on the lights when you go into a dark area. Replace any light bulbs as soon as they burn out. Set up your furniture so you have a clear path. Avoid moving your furniture around. If any of your floors are uneven, fix them. If there are any pets around you, be aware of where they are. Review your medicines with your doctor. Some medicines can make you feel dizzy. This can increase your chance of falling. Ask your doctor what other things that you can do to help prevent falls. This information is not intended to replace advice given to you by your health care provider. Make sure you discuss any questions you have with your health care provider. Document Released: 11/09/2008 Document Revised: 06/21/2015 Document Reviewed: 02/17/2014 Elsevier Interactive Patient Education  2017 [employer].

## 2022-06-30 DIAGNOSIS — M65342 Trigger finger, left ring finger: Secondary | ICD-10-CM | POA: Diagnosis not present

## 2022-06-30 DIAGNOSIS — M25542 Pain in joints of left hand: Secondary | ICD-10-CM | POA: Diagnosis not present

## 2022-06-30 DIAGNOSIS — M25541 Pain in joints of right hand: Secondary | ICD-10-CM | POA: Diagnosis not present

## 2022-06-30 DIAGNOSIS — M19041 Primary osteoarthritis, right hand: Secondary | ICD-10-CM | POA: Diagnosis not present

## 2022-06-30 DIAGNOSIS — M180 Bilateral primary osteoarthritis of first carpometacarpal joints: Secondary | ICD-10-CM | POA: Diagnosis not present

## 2022-06-30 DIAGNOSIS — M19042 Primary osteoarthritis, left hand: Secondary | ICD-10-CM | POA: Diagnosis not present

## 2022-06-30 DIAGNOSIS — M65332 Trigger finger, left middle finger: Secondary | ICD-10-CM | POA: Diagnosis not present

## 2022-07-15 DIAGNOSIS — Z0100 Encounter for examination of eyes and vision without abnormal findings: Secondary | ICD-10-CM | POA: Diagnosis not present

## 2022-07-19 ENCOUNTER — Other Ambulatory Visit: Payer: Self-pay | Admitting: Family Medicine

## 2022-07-19 DIAGNOSIS — R232 Flushing: Secondary | ICD-10-CM

## 2022-10-09 NOTE — Progress Notes (Signed)
Name: Kari Lee   MRN: [id number]    DOB: [birth year]   Date:10/10/2022       Progress Note  Subjective  Chief Complaint  Follow Up  HPI  HTN  She is taking  Losartan, she denies chest pain, palpitation of dizziness. Denies side effects of medication . BP was towards high end of normal, but back to normal now.   Elevated Red blood cells: she snores but feels rested during the day, we will monitor, consider donating blood   Dyslipidemia: LDL was at goal in March at 77    Bradycardia: she states her heart rate is always slow and seen by cardiologist - [place]  but negative evaluation. She states usually in the 60's. Today HR is 80    Overweight: she joined [employer] 05/26/2017, she started at 200.2 lbs and she was down to 173 lbs but during the pandemic it went up to 193 lbs, she was down to 173 lbs during her last visit in March but stopped weight watchers was stable for a while between  stable between 187-189 lbs , but since last visit she has been walking 30 minutes at least 3 days a week and weight today is 177 lbs and is feeling good about it    Hot Flashes: she saw Dr. Chauncey Cruel  she was noticing dryness in her face and also hot flashes, She tried to stop but it caused hot flashes, she needs refills today    Elevated liver enzymes: over 100's , seen by Dr. Lars Pinks, US showed fatty liver, changed diet and lost weight, liver enzymes normalized. She is gaining weight again , she will resume a healthier diet . We will continue to monitor  OA: most symptoms are on her hands, but has a history of bilateral  knee replacement and left hip, has DIP deformities she is now on celebrex 200 mg but is having to take Tylenol daily, we switched to celebrex 100 mg BID but she is not sure if the function has improved of stable, advised to try stopping or skipping doses and try to only take Tylenol    Hyperlipidemia: she is on atorvastatin and is doing well at this time, she denies myalgia. Last  level was at goal, triglycerides back to normal. 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 Rosita Kea and referred to Dr. Council Mechanic. She has not been back in a while , taking celebrex bid , discussed trying to skip doses    IMPRESSION: Transitional lumbosacral anatomy. Please see numbering scheme above and correlate with plain films if any intervention is planned.   Disc bulge to the left at L2-3 causes narrowing in the left subarticular recess which could impact the left L3 root. There is also mild left foraminal narrowing at this level.   Disc and endplate spur to the right at L3-4 cause narrowing in the right subarticular recess and could impact the descending right L4 root. Moderate to moderately severe foraminal narrowing at L3-4 is worse on the left.   Cervical myelopathy: seen by Dr. Adriana Simas back in June 2022 and surgery was discussed versus surveillance She denies any weakness or numbness. She states that losing weight seems to have helped. Only occasionally has neck pain and usually when not moving around   Patient Active Problem List   Diagnosis Date Noted   Cervical myelopathy (HCC) 11/01/2021   S/P left rotator  cuff repair 11/06/2020   Vitamin D deficiency 12/26/2019   DDD (degenerative disc disease), lumbar 12/26/2019   Spinal stenosis of lumbar region without neurogenic claudication 12/26/2019   Osteoarthritis 08/19/2016   Bradycardia 05/20/2016   Current long-term use of postmenopausal hormone replacement therapy 05/20/2016   GERD without esophagitis 05/20/2016   Hyperlipidemia, unspecified 05/20/2016   Essential hypertension 05/20/2016   History of hip replacement, total, 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    Past Surgical History:  Procedure Laterality Date   CERVICAL CONE BIOPSY     COLONOSCOPY  10/2010   COLONOSCOPY  2004   endoscopic carpal tunn Bilateral    HALLUX VALGUS CORRECTION Right 2005    HYSTERECTOMY ABDOMINAL WITH SALPINGECTOMY  1981   INCISION TENDON SHEATH HAND Right    JOINT REPLACEMENT Right 11/21/2010   knee   JOINT REPLACEMENT Left 11/04/2011   hip   JOINT REPLACEMENT Left 04/27/2012   knee   KNEE ARTHROSCOPY Right    KNEE ARTHROSCOPY Left 12/18/2011   SHOULDER ARTHROSCOPY WITH SUBACROMIAL DECOMPRESSION, ROTATOR CUFF REPAIR AND BICEP TENDON REPAIR Left 09/20/2020   Procedure: SHOULDER ARTHROSCOPY WITH DEBRIDEMENT, DECOMPRESSION, ROTATOR CUFF REPAIR AND BICEPS TENODESIS;  Surgeon: Christena Flake, MD;  Location: [facility] OR[facility];  Service: Orthopedics;  Laterality: Left;    Family History  Problem Relation Age of Onset   Alzheimer's disease Mother 103   Osteoporosis Mother    Stroke Father 38   Arthritis Sister    Basal cell carcinoma Sister    Breast cancer Maternal Aunt 74   Macular degeneration Other     Social History   Tobacco Use   Smoking status: Never   Smokeless tobacco: Never  Substance Use Topics   Alcohol use: Yes    Alcohol/week: 1.0 standard drink of alcohol    Types: 1 Glasses of wine per week     Current Outpatient Medications:    acetaminophen (TYLENOL) 500 MG tablet, Take 1 tablet (500 mg total) by mouth 2 (two) times daily., Disp: 60 tablet, Rfl: 0   Ascorbic Acid (VITAMIN C) 1000 MG tablet, Take 1,000 mg by mouth daily., Disp: , Rfl:    atorvastatin (LIPITOR) 40 MG tablet, Take 1 tablet (40 mg total) by mouth daily., Disp: 90 tablet, Rfl: 1   celecoxib (CELEBREX) 100 MG capsule, TAKE 1 CAPSULE BY MOUTH TWICE A DAY, Disp: 180 capsule, Rfl: 0   diclofenac Sodium (VOLTAREN) 1 % GEL, Apply topically 4 (four) times daily. As needed, Disp: , Rfl:    estradiol (ESTRACE) 0.5 MG tablet, TAKE 1 TABLET BY MOUTH EVERY DAY, Disp: 90 tablet, Rfl: 0   losartan (COZAAR) 50 MG tablet, Take 1 tablet (50 mg total) by mouth daily., Disp: 90 tablet, Rfl: 1   Magnesium 250 MG TABS, Take 250 mg by mouth at bedtime., Disp: , Rfl:    Multiple Vitamin (MULTIVITAMIN)  tablet, Take 1 tablet by mouth daily., Disp: , Rfl:    Vitamin D, Cholecalciferol, 50 MCG (2000 UT) CAPS, Take 2,000 Units by mouth daily., Disp: , Rfl:   Allergies  Allergen Reactions   Sulfa Antibiotics Nausea Only   Penicillins Rash    TOLERATED CEFAZOLIN    I personally reviewed active problem list, medication list, allergies, family history, social history, health maintenance with the patient/caregiver today.   ROS  Constitutional: Negative for fever , positive for weight change.  Respiratory: Negative for cough and shortness of breath.   Cardiovascular: Negative for chest pain or palpitations.  Gastrointestinal: Negative for abdominal pain, no  bowel changes.  Musculoskeletal: Negative for gait problem or joint swelling.  Skin: Negative for rash.  Neurological: Negative for dizziness or headache.  No other specific complaints in a complete review of systems (except as listed in HPI above).   Objective  Vitals:   10/10/22 0843  BP: 128/72  Pulse: 80  Resp: 16  SpO2: 97%  Weight: 177 lb (80.3 kg)  Height: 5\' 3"  (1.6 m)    Body mass index is 31.35 kg/m.  Physical Exam  Constitutional: Patient appears well-developed and well-nourished. Obese  No distress.  HEENT: head atraumatic, normocephalic, pupils equal and reactive to light, neck supple Cardiovascular: Normal rate, regular rhythm and normal heart sounds.  No murmur heard. No BLE edema. Pulmonary/Chest: Effort normal and breath sounds normal. No respiratory distress. Abdominal: Soft.  There is no tenderness. Psychiatric: Patient has a normal mood and affect. behavior is normal. Judgment and thought content normal.    PHQ2/9:    10/10/2022    8:43 AM 06/26/2022   10:57 AM 05/07/2022    1:53 PM 04/09/2022    8:10 AM 01/08/2022    8:22 AM  Depression screen PHQ 2/9  Decreased Interest 0 0 0 0 0  Down, Depressed, Hopeless 0 0 0 0 0  PHQ - 2 Score 0 0 0 0 0  Altered sleeping 0  0 0 0  Tired, decreased energy  0  0 0 0  Change in appetite 0  0 0 0  Feeling bad or failure about yourself  0  0 0 0  Trouble concentrating 0  0 0 0  Moving slowly or fidgety/restless 0  0 0 0  Suicidal thoughts 0  0 0 0  PHQ-9 Score 0  0 0 0    phq 9 is negative   Fall Risk:    10/10/2022    8:42 AM 06/25/2022   11:35 PM 05/07/2022    1:53 PM 04/09/2022    8:10 AM 01/08/2022    8:24 AM  Fall Risk   Falls in the past year? 1 1 1 1 1   Number falls in past yr: 1 1 1 1  0  Injury with Fall? 1 1 1 1 1   Risk for fall due to : No Fall Risks  History of fall(s) No Fall Risks History of fall(s)  Follow up Falls prevention discussed Education provided;Falls prevention discussed Falls prevention discussed;Education provided;Falls evaluation completed Falls prevention discussed Falls prevention discussed;Education provided;Falls evaluation completed      Functional Status Survey: Is the patient deaf or have difficulty hearing?: Yes Does the patient have difficulty seeing, even when wearing glasses/contacts?: No Does the patient have difficulty concentrating, remembering, or making decisions?: No Does the patient have difficulty walking or climbing stairs?: No Does the patient have difficulty dressing or bathing?: No Does the patient have difficulty doing errands alone such as visiting a doctor's office or shopping?: No    Assessment & Plan  1. Essential hypertension  - losartan (COZAAR) 50 MG tablet; Take 1 tablet (50 mg total) by mouth daily.  Dispense: 90 tablet; Refill: 1 - COMPLETE METABOLIC PANEL WITH GFR  2. High hematocrit  - CBC with Differential/Platelet  3. Cervical myelopathy (HCC)  - celecoxib (CELEBREX) 100 MG capsule; Take 1 capsule (100 mg total) by mouth 2 (two) times daily.  Dispense: 180 capsule; Refill: 1  4. Primary osteoarthritis of both hands  - celecoxib (CELEBREX) 100 MG capsule; Take 1 capsule (100 mg total) by mouth 2 (two) times  daily.  Dispense: 180 capsule; Refill: 1  5.  Vitamin D deficiency  Continue supplementation   6. Breast cancer screening by mammogram  - MM 3D SCREENING MAMMOGRAM BILATERAL BREAST; Future  7. Mixed hyperlipidemia  - atorvastatin (LIPITOR) 40 MG tablet; Take 1 tablet (40 mg total) by mouth daily.  Dispense: 90 tablet; Refill: 1  8. Obesity (BMI 30.0-34.9)  Discussed with the patient the risk posed by an increased BMI. Discussed importance of portion control, calorie counting and at least 150 minutes of physical activity weekly. Avoid sweet beverages and drink more water. Eat at least 6 servings of fruit and vegetables daily    9. Hot flashes  - estradiol (ESTRACE) 0.5 MG tablet; Take 1 tablet (0.5 mg total) by mouth daily.  Dispense: 90 tablet; Refill: 3

## 2022-10-10 ENCOUNTER — Encounter: Payer: Self-pay | Admitting: Family Medicine

## 2022-10-10 ENCOUNTER — Ambulatory Visit (INDEPENDENT_AMBULATORY_CARE_PROVIDER_SITE_OTHER): Payer: Medicare HMO | Admitting: Family Medicine

## 2022-10-10 VITALS — BP 128/72 | HR 80 | Resp 16 | Ht 63.0 in | Wt 177.0 lb

## 2022-10-10 DIAGNOSIS — I10 Essential (primary) hypertension: Secondary | ICD-10-CM | POA: Diagnosis not present

## 2022-10-10 DIAGNOSIS — G959 Disease of spinal cord, unspecified: Secondary | ICD-10-CM | POA: Diagnosis not present

## 2022-10-10 DIAGNOSIS — E559 Vitamin D deficiency, unspecified: Secondary | ICD-10-CM | POA: Diagnosis not present

## 2022-10-10 DIAGNOSIS — E669 Obesity, unspecified: Secondary | ICD-10-CM | POA: Diagnosis not present

## 2022-10-10 DIAGNOSIS — Z1231 Encounter for screening mammogram for malignant neoplasm of breast: Secondary | ICD-10-CM | POA: Diagnosis not present

## 2022-10-10 DIAGNOSIS — E782 Mixed hyperlipidemia: Secondary | ICD-10-CM

## 2022-10-10 DIAGNOSIS — R232 Flushing: Secondary | ICD-10-CM

## 2022-10-10 DIAGNOSIS — M19042 Primary osteoarthritis, left hand: Secondary | ICD-10-CM | POA: Diagnosis not present

## 2022-10-10 DIAGNOSIS — M19041 Primary osteoarthritis, right hand: Secondary | ICD-10-CM | POA: Diagnosis not present

## 2022-10-10 DIAGNOSIS — R718 Other abnormality of red blood cells: Secondary | ICD-10-CM

## 2022-10-10 MED ORDER — LOSARTAN POTASSIUM 50 MG PO TABS: 50.0000 mg | ORAL_TABLET | Freq: Every day | ORAL | 1 refills | Status: DC

## 2022-10-10 MED ORDER — ESTRADIOL 0.5 MG PO TABS: 0.5000 mg | ORAL_TABLET | Freq: Every day | ORAL | 3 refills | Status: DC

## 2022-10-10 MED ORDER — CELECOXIB 100 MG PO CAPS
100.0000 mg | ORAL_CAPSULE | Freq: Two times a day (BID) | ORAL | 1 refills | Status: DC
Start: 2022-10-10 — End: 2023-05-13

## 2022-10-10 MED ORDER — ATORVASTATIN CALCIUM 40 MG PO TABS: 40.0000 mg | ORAL_TABLET | Freq: Every day | ORAL | 1 refills | Status: DC

## 2022-10-11 LAB — CBC WITH DIFFERENTIAL/PLATELET
Absolute Monocytes: 392 {cells}/uL (ref 200–950)
Basophils Absolute: 32 {cells}/uL (ref 0–200)
Basophils Relative: 0.8 %
Eosinophils Absolute: 100 {cells}/uL (ref 15–500)
Eosinophils Relative: 2.5 %
HCT: 45.5 % — ABNORMAL HIGH (ref 35.0–45.0)
Hemoglobin: 15.5 g/dL (ref 11.7–15.5)
Lymphs Abs: 1612 {cells}/uL (ref 850–3900)
MCH: 31.6 pg (ref 27.0–33.0)
MCHC: 34.1 g/dL (ref 32.0–36.0)
MCV: 92.7 fL (ref 80.0–100.0)
MPV: 11 fL (ref 7.5–12.5)
Monocytes Relative: 9.8 %
Neutro Abs: 1864 {cells}/uL (ref 1500–7800)
Neutrophils Relative %: 46.6 %
Platelets: 161 10*3/uL (ref 140–400)
RBC: 4.91 10*6/uL (ref 3.80–5.10)
RDW: 12.9 % (ref 11.0–15.0)
Total Lymphocyte: 40.3 %
WBC: 4 10*3/uL (ref 3.8–10.8)

## 2022-10-11 LAB — COMPLETE METABOLIC PANEL WITH GFR
AG Ratio: 1.8 (calc) (ref 1.0–2.5)
ALT: 24 U/L (ref 6–29)
AST: 27 U/L (ref 10–35)
Albumin: 4.3 g/dL (ref 3.6–5.1)
Alkaline phosphatase (APISO): 66 U/L (ref 37–153)
BUN: 16 mg/dL (ref 7–25)
CO2: 28 mmol/L (ref 20–32)
Calcium: 9.8 mg/dL (ref 8.6–10.4)
Chloride: 103 mmol/L (ref 98–110)
Creat: 0.76 mg/dL (ref 0.60–1.00)
Globulin: 2.4 g/dL (ref 1.9–3.7)
Glucose, Bld: 98 mg/dL (ref 65–99)
Potassium: 4.4 mmol/L (ref 3.5–5.3)
Sodium: 141 mmol/L (ref 135–146)
Total Bilirubin: 0.9 mg/dL (ref 0.2–1.2)
Total Protein: 6.7 g/dL (ref 6.1–8.1)
eGFR: 82 mL/min/{1.73_m2} (ref >=60)

## 2022-12-23 ENCOUNTER — Ambulatory Visit
Admission: RE | Admit: 2022-12-23 | Discharge: 2022-12-23 | Disposition: A | Discharge status: Home / Self Care | Payer: Medicare HMO | Source: Ambulatory Visit | Attending: Family Medicine | Admitting: Family Medicine

## 2022-12-23 DIAGNOSIS — Z1231 Encounter for screening mammogram for malignant neoplasm of breast: Secondary | ICD-10-CM | POA: Insufficient documentation

## 2022-12-24 NOTE — Progress Notes (Signed)
Name: Kari Lee   MRN: [id number]    DOB: 07/03/1945   Date:12/30/2022       Progress Note  Subjective  Chief Complaint  Chief Complaint  Patient presents with   Medical Management of Chronic Issues    HPI  Discussed the use of AI scribe software for clinical note transcription with the patient, who gave verbal consent to proceed.  History of Present Illness   The patient, with a history of hypertension, hyperlipidemia, osteoarthritis, and fatty liver disease, presented for a routine follow-up. She reported no new health issues since the last visit. The patient's blood pressure, which had previously been on the high end of normal, had stabilized, with recent readings below 140. She denied experiencing any dizziness upon standing.  The patient reported two falls within the year but none since the last visit. She denied any chest pain or palpitations. Recent blood work showed normal liver function, despite a history of fatty liver disease, which the patient attributed to dietary changes. Hemoglobin levels, previously high, had normalized. The patient also reported a history of snoring and had been advised to donate blood to lower her hemoglobin levels, but had experienced near fainting during her last donation attempt many years ago .  The patient's cholesterol levels had slightly increased from 64 to 77 but remained within acceptable ranges.    She reported no tingling or numbness, but did note weakness in her hands due to osteoarthritis, which had led to some functional changes, such as difficulty gripping objects. Despite this, she was still able to perform daily tasks such as cooking.  The patient had been maintaining her weight in the 170s after an initial weight loss from 200.2 pounds to 173 pounds, followed by a slight increase during the pandemic. She reported regular walking and stair climbing as part of her exercise routine. She also reported occasional leg and foot cramping at  night, which she managed with magnesium and mustard, as recommended by a friend.  The patient had artificial joints in both knees and the left hip and was taking Celebrex for joint stiffness. She reported no recent issues with her lower spine or cervical myelopathy, despite degeneration seen in imaging. The patient had chosen to monitor these conditions rather than undergo surgery. She was also taking atorvastatin for hyperlipidemia without any reported side effects.         Patient Active Problem List   Diagnosis Date Noted   Cervical myelopathy (HCC) 11/01/2021   S/P left rotator cuff repair 11/06/2020   Vitamin D deficiency 12/26/2019   DDD (degenerative disc disease), lumbar 12/26/2019   Spinal stenosis of lumbar region without neurogenic claudication 12/26/2019   Osteoarthritis 08/19/2016   Bradycardia 05/20/2016   Current long-term use of postmenopausal hormone replacement therapy 05/20/2016   GERD without esophagitis 05/20/2016   Hyperlipidemia, unspecified 05/20/2016   Essential hypertension 05/20/2016   History of hip replacement, total, 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    Past Surgical History:  Procedure Laterality Date   CERVICAL CONE BIOPSY     COLONOSCOPY  10/2010   COLONOSCOPY  2004   endoscopic carpal tunn Bilateral    HALLUX VALGUS CORRECTION Right 2005   HYSTERECTOMY ABDOMINAL WITH SALPINGECTOMY  1981   INCISION TENDON SHEATH HAND Right    JOINT REPLACEMENT Right 11/21/2010   knee   JOINT REPLACEMENT Left 11/04/2011   hip   JOINT REPLACEMENT Left 04/27/2012   knee   KNEE ARTHROSCOPY Right    KNEE ARTHROSCOPY Left 12/18/2011  SHOULDER ARTHROSCOPY WITH SUBACROMIAL DECOMPRESSION, ROTATOR CUFF REPAIR AND BICEP TENDON REPAIR Left 09/20/2020   Procedure: SHOULDER ARTHROSCOPY WITH DEBRIDEMENT, DECOMPRESSION, ROTATOR CUFF REPAIR AND BICEPS TENODESIS;  Surgeon: Christena Flake, MD;  Location: [facility] ORS;  Service: Orthopedics;  Laterality: Left;    Family History  Problem Relation Age  of Onset   Alzheimer's disease Mother 72   Osteoporosis Mother    Stroke Father 40   Arthritis Sister    Basal cell carcinoma Sister    Breast cancer Maternal Aunt 9   Macular degeneration Other     Social History   Tobacco Use   Smoking status: Never   Smokeless tobacco: Never  Substance Use Topics   Alcohol use: Yes    Alcohol/week: 1.0 standard drink of alcohol    Types: 1 Glasses of wine per week     Current Outpatient Medications:    acetaminophen (TYLENOL) 500 MG tablet, Take 1 tablet (500 mg total) by mouth 2 (two) times daily., Disp: 60 tablet, Rfl: 0   Ascorbic Acid (VITAMIN C) 1000 MG tablet, Take 1,000 mg by mouth daily., Disp: , Rfl:    atorvastatin (LIPITOR) 40 MG tablet, Take 1 tablet (40 mg total) by mouth daily., Disp: 90 tablet, Rfl: 1   celecoxib (CELEBREX) 100 MG capsule, Take 1 capsule (100 mg total) by mouth 2 (two) times daily., Disp: 180 capsule, Rfl: 1   diclofenac Sodium (VOLTAREN) 1 % GEL, Apply topically 4 (four) times daily. As needed, Disp: , Rfl:    estradiol (ESTRACE) 0.5 MG tablet, Take 1 tablet (0.5 mg total) by mouth daily., Disp: 90 tablet, Rfl: 3   losartan (COZAAR) 50 MG tablet, Take 1 tablet (50 mg total) by mouth daily., Disp: 90 tablet, Rfl: 1   Magnesium 250 MG TABS, Take 250 mg by mouth at bedtime., Disp: , Rfl:    Multiple Vitamin (MULTIVITAMIN) tablet, Take 1 tablet by mouth daily., Disp: , Rfl:    Vitamin D, Cholecalciferol, 50 MCG (2000 UT) CAPS, Take 2,000 Units by mouth daily., Disp: , Rfl:   Allergies  Allergen Reactions   Sulfa Antibiotics Nausea Only   Penicillins Rash    TOLERATED CEFAZOLIN    I personally reviewed active problem list, medication list, allergies, family history, social history with the patient/caregiver today.   ROS  Ten systems reviewed and is negative except as mentioned in HPI    Objective  Vitals:   12/30/22 1101  BP: 136/78  Pulse: 61  Resp: 16  Temp: 97.6 F (36.4 C)  TempSrc: Oral   SpO2: 98%  Weight: 179 lb 4.8 oz (81.3 kg)  Height: 5\' 3"  (1.6 m)    Body mass index is 31.76 kg/m.  Physical Exam  Constitutional: Patient appears well-developed and well-nourished. Obese  No distress.  HEENT: head atraumatic, normocephalic, pupils equal and reactive to light, neck supple Cardiovascular: Normal rate, regular rhythm and normal heart sounds.  No murmur heard. No BLE edema. Pulmonary/Chest: Effort normal and breath sounds normal. No respiratory distress. Muscular skeletal: PIP and DIP swelling, no redness or increase in warmth  Abdominal: Soft.  There is no tenderness. Psychiatric: Patient has a normal mood and affect. behavior is normal. Judgment and thought content normal.   Recent Results (from the past 2160 hour(s))  CBC with Differential/Platelet     Status: Abnormal   Collection Time: 10/10/22  9:33 AM  Result Value Ref Range   WBC 4.0 3.8 - 10.8 Thousand/uL   RBC 4.91 3.80 -  5.10 Million/uL   Hemoglobin 15.5 11.7 - 15.5 g/dL   HCT 64.3 (H) 32.9 - 51.8 %   MCV 92.7 80.0 - 100.0 fL   MCH 31.6 27.0 - 33.0 pg   MCHC 34.1 32.0 - 36.0 g/dL   RDW 84.1 66.0 - 63.0 %   Platelets 161 140 - 400 Thousand/uL   MPV 11.0 7.5 - 12.5 fL   Neutro Abs 1,864 1,500 - 7,800 cells/uL   Lymphs Abs 1,612 850 - 3,900 cells/uL   Absolute Monocytes 392 200 - 950 cells/uL   Eosinophils Absolute 100 15 - 500 cells/uL   Basophils Absolute 32 0 - 200 cells/uL   Neutrophils Relative % 46.6 %   Total Lymphocyte 40.3 %   Monocytes Relative 9.8 %   Eosinophils Relative 2.5 %   Basophils Relative 0.8 %  COMPLETE METABOLIC PANEL WITH GFR     Status: None   Collection Time: 10/10/22  9:33 AM  Result Value Ref Range   Glucose, Bld 98 65 - 99 mg/dL    Comment: .            Fasting reference interval .    BUN 16 7 - 25 mg/dL   Creat 1.60 1.09 - 3.23 mg/dL   eGFR 82 > OR = 60 FT/DDU/2.02R4   BUN/Creatinine Ratio SEE NOTE: 6 - 22 (calc)    Comment:    Not Reported: BUN and  Creatinine are within    reference range. .    Sodium 141 135 - 146 mmol/L   Potassium 4.4 3.5 - 5.3 mmol/L   Chloride 103 98 - 110 mmol/L   CO2 28 20 - 32 mmol/L   Calcium 9.8 8.6 - 10.4 mg/dL   Total Protein 6.7 6.1 - 8.1 g/dL   Albumin 4.3 3.6 - 5.1 g/dL   Globulin 2.4 1.9 - 3.7 g/dL (calc)   AG Ratio 1.8 1.0 - 2.5 (calc)   Total Bilirubin 0.9 0.2 - 1.2 mg/dL   Alkaline phosphatase (APISO) 66 37 - 153 U/L   AST 27 10 - 35 U/L   ALT 24 6 - 29 U/L      PHQ2/9:    12/30/2022   11:01 AM 10/10/2022    8:43 AM 06/26/2022   10:57 AM 05/07/2022    1:53 PM 04/09/2022    8:10 AM  Depression screen PHQ 2/9  Decreased Interest 0 0 0 0 0  Down, Depressed, Hopeless 0 0 0 0 0  PHQ - 2 Score 0 0 0 0 0  Altered sleeping 0 0  0 0  Tired, decreased energy 0 0  0 0  Change in appetite 0 0  0 0  Feeling bad or failure about yourself  0 0  0 0  Trouble concentrating 0 0  0 0  Moving slowly or fidgety/restless 0 0  0 0  Suicidal thoughts 0 0  0 0  PHQ-9 Score 0 0  0 0  Difficult doing work/chores Not difficult at all        phq 9 is negative   Fall Risk:    12/30/2022   11:01 AM 10/10/2022    8:42 AM 06/25/2022   11:35 PM 05/07/2022    1:53 PM 04/09/2022    8:10 AM  Fall Risk   Falls in the past year? 1 1 1 1 1   Number falls in past yr: 0 1 1 1 1   Injury with Fall? 0 1 1 1 1   Risk for fall  due to : Impaired balance/gait No Fall Risks  History of fall(s) No Fall Risks  Follow up Falls prevention discussed;Education provided;Falls evaluation completed Falls prevention discussed Education provided;Falls prevention discussed Falls prevention discussed;Education provided;Falls evaluation completed Falls prevention discussed     Functional Status Survey: Is the patient deaf or have difficulty hearing?: No Does the patient have difficulty seeing, even when wearing glasses/contacts?: No Does the patient have difficulty concentrating, remembering, or making decisions?: No Does the patient  have difficulty walking or climbing stairs?: No Does the patient have difficulty dressing or bathing?: No Does the patient have difficulty doing errands alone such as visiting a doctor's office or shopping?: No    Assessment & Plan  Assessment and Plan    Hypertension Blood pressure well-controlled with Losartan. No symptoms of dizziness or orthostasis. -Continue Losartan. -Check blood pressure at next visit.  Hyperlipidemia LDL slightly elevated but within acceptable range on Atorvastatin. -Continue Atorvastatin. -Check lipid panel at next visit.  Osteoarthritis Noted stiffness and pain in hands, managed with Celebrex and Tylenol. No tingling or numbness. -Continue Celebrex 100mg  twice daily and Tylenol 500mg  twice daily. -Consider physical activity modifications such as chair yoga or indoor walking exercises.  Obesity BMI 31, patient has made significant lifestyle changes and has maintained weight in the 170s. -Encourage continued physical activity and healthy diet.  Fatty Liver Liver enzymes normal after dietary modifications. -Continue current diet and lifestyle modifications.  Bradycardia Chronic low heart rate, asymptomatic. -No intervention needed at this time.  Spinal Stenosis and Cervical Myelopathy No current symptoms or pain. -No intervention needed at this time.  Leg Cramps Occasional nocturnal leg cramps, possibly related to spinal stenosis or restless leg syndrome. -Continue magnesium supplementation. -Consider medical intervention if frequency or severity increases.  General Health Maintenance -Declined flu and shingles vaccines. -Next mammogram due November 2025. -Colonoscopy not due until 2026. -Schedule follow-up visit in 4-6 months (May-June 2025).

## 2022-12-30 ENCOUNTER — Ambulatory Visit (INDEPENDENT_AMBULATORY_CARE_PROVIDER_SITE_OTHER): Payer: Medicare HMO | Admitting: Family Medicine

## 2022-12-30 ENCOUNTER — Encounter: Payer: Self-pay | Admitting: Family Medicine

## 2022-12-30 VITALS — BP 136/78 | HR 61 | Temp 97.6°F | Resp 16 | Ht 63.0 in | Wt 179.3 lb

## 2022-12-30 DIAGNOSIS — I10 Essential (primary) hypertension: Secondary | ICD-10-CM

## 2022-12-30 DIAGNOSIS — E66811 Obesity, class 1: Secondary | ICD-10-CM

## 2022-12-30 DIAGNOSIS — M19042 Primary osteoarthritis, left hand: Secondary | ICD-10-CM

## 2022-12-30 DIAGNOSIS — M48061 Spinal stenosis, lumbar region without neurogenic claudication: Secondary | ICD-10-CM

## 2022-12-30 DIAGNOSIS — Z23 Encounter for immunization: Secondary | ICD-10-CM

## 2022-12-30 DIAGNOSIS — M19041 Primary osteoarthritis, right hand: Secondary | ICD-10-CM | POA: Diagnosis not present

## 2022-12-30 DIAGNOSIS — E782 Mixed hyperlipidemia: Secondary | ICD-10-CM

## 2022-12-30 DIAGNOSIS — G959 Disease of spinal cord, unspecified: Secondary | ICD-10-CM

## 2022-12-30 DIAGNOSIS — R718 Other abnormality of red blood cells: Secondary | ICD-10-CM | POA: Diagnosis not present

## 2022-12-30 DIAGNOSIS — K219 Gastro-esophageal reflux disease without esophagitis: Secondary | ICD-10-CM | POA: Diagnosis not present

## 2023-02-17 DIAGNOSIS — Z0100 Encounter for examination of eyes and vision without abnormal findings: Secondary | ICD-10-CM | POA: Diagnosis not present

## 2023-03-20 DIAGNOSIS — S82035A Nondisplaced transverse fracture of left patella, initial encounter for closed fracture: Secondary | ICD-10-CM | POA: Diagnosis not present

## 2023-03-20 DIAGNOSIS — M25562 Pain in left knee: Secondary | ICD-10-CM | POA: Diagnosis not present

## 2023-04-10 IMAGING — MR MR CERVICAL SPINE W/O CM
5 series · 40 of 48 positions shown · non-contrast
Comparison: 09/04/2010

CLINICAL DATA: Left facial swelling. No known injury.

EXAM:
MRI CERVICAL SPINE WITHOUT CONTRAST
TECHNIQUE: Multiplanar, multisequence MR imaging of the cervical spine was
performed. No intravenous contrast was administered.

[Series 2: T2 · sagittal · 3.0mm · 0.69mm/px · 8 of 13 slices shown (1 of 2)]
[im 1/13]
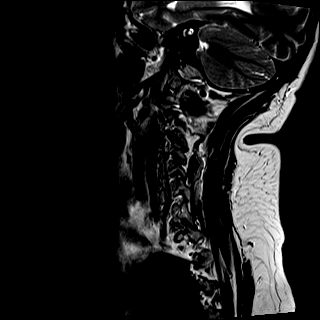
[im 2/13]
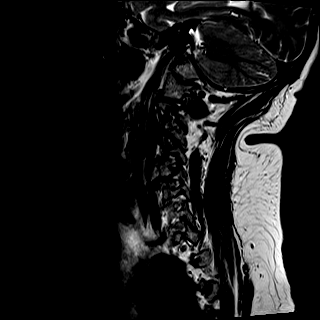
[im 4/13]
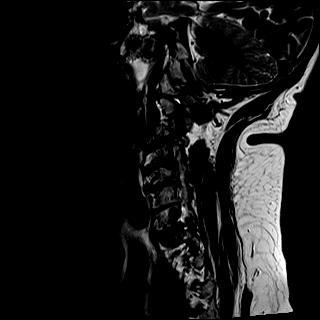
[im 6/13]
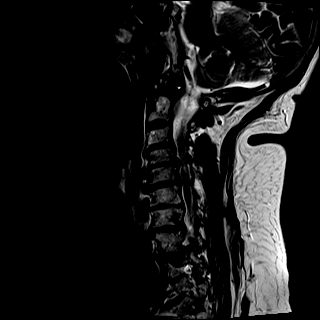
[im 7/13]
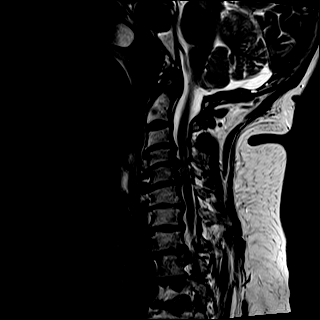
[im 9/13]
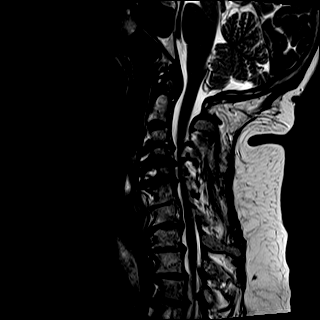
[im 11/13]
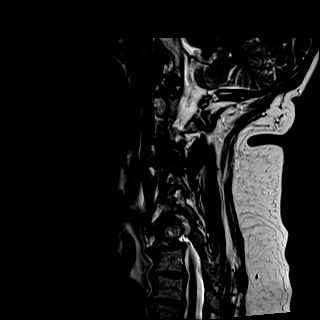
[im 13/13]
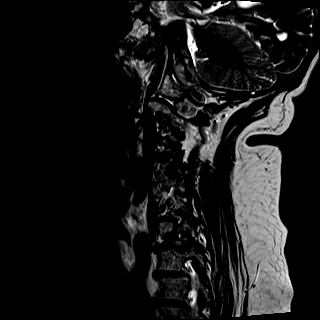

[Series 3: T1 · sagittal · 3.0mm · 0.86mm/px · 7 of 13 slices shown]
[im 1/13]
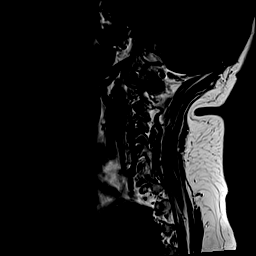
[im 3/13]
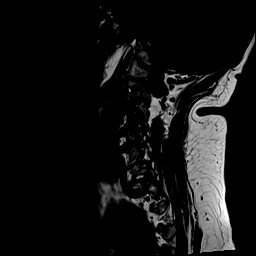
[im 5/13]
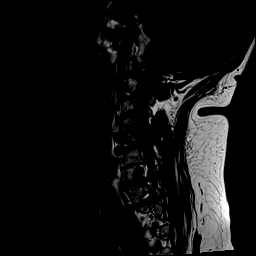
[im 7/13]
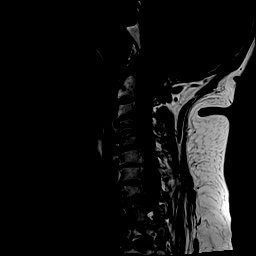
[im 9/13]
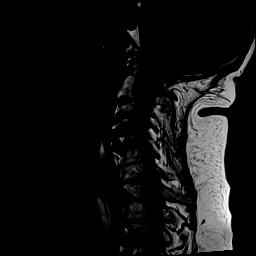
[im 11/13]
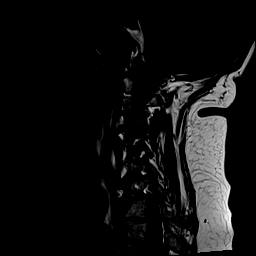
[im 13/13]
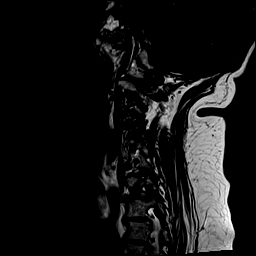

[Series 4: STIR · sagittal · 3.0mm · 0.69mm/px · 7 of 13 slices shown]
[im 1/13]
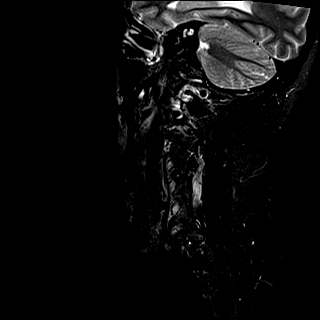
[im 3/13]
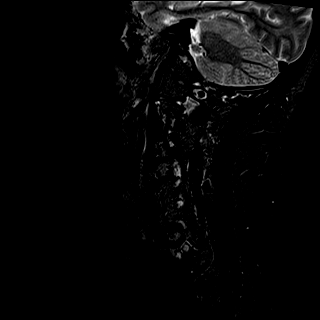
[im 5/13]
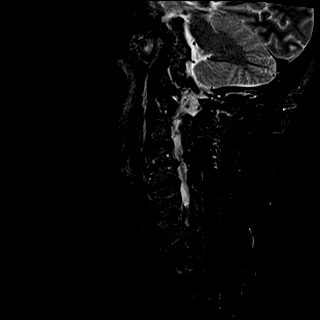
[im 7/13]
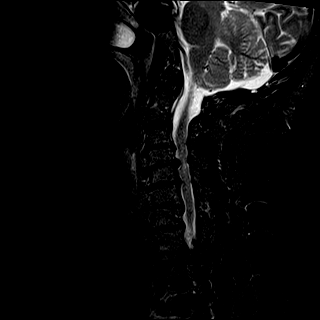
[im 9/13]
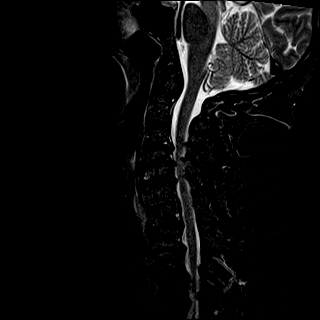
[im 11/13]
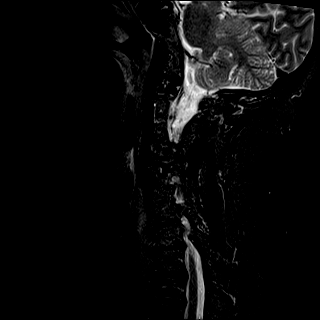
[im 13/13]
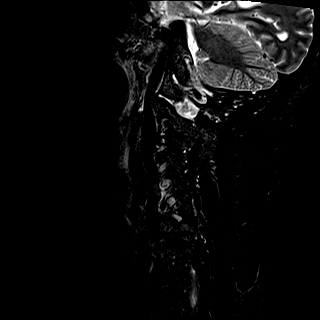

[Series 5: T2 · axial · 3.0mm · 0.62mm/px · z∈[-97,-18]mm · 10 of 23 slices shown (2 of 2)]
[im 1/23]
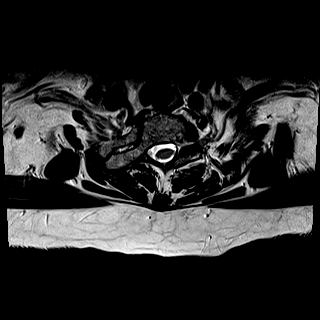
[im 2/23]
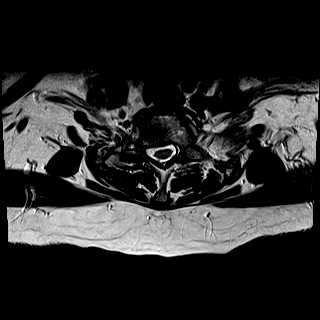
[im 4/23]
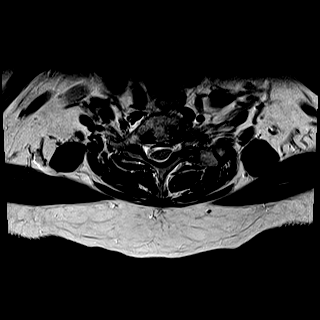
[im 8/23]
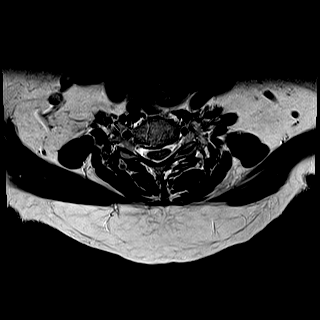
[im 10/23]
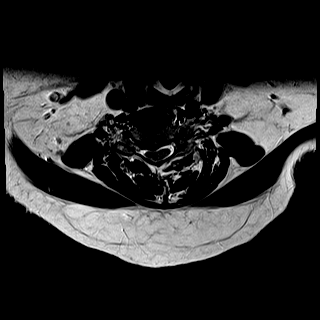
[im 12/23]
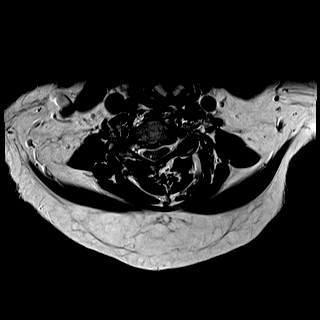
[im 13/23]
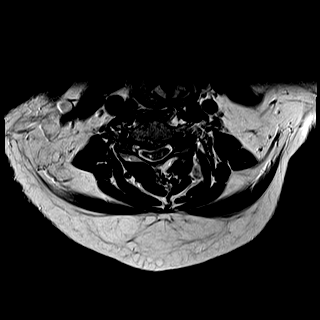
[im 15/23]
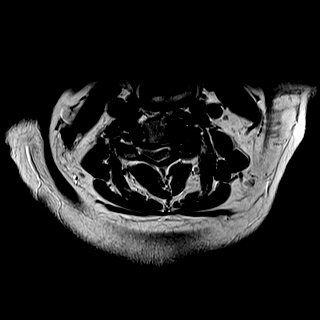
[im 19/23]
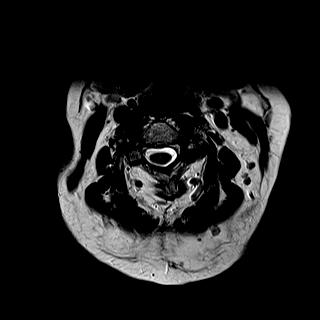
[im 23/23]
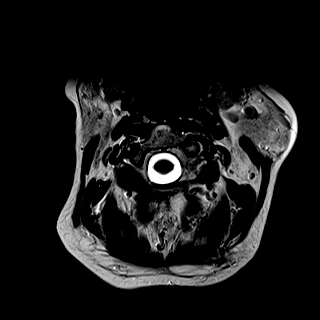

[Series 6: mpgr ax · axial · 3.0mm · 0.35mm/px · z∈[-84,-5]mm · 8 of 23 slices shown]
[im 1/23]
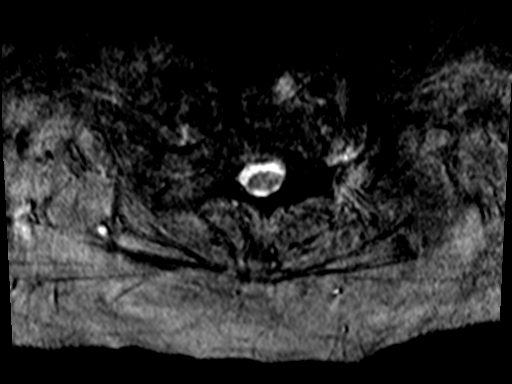
[im 4/23]
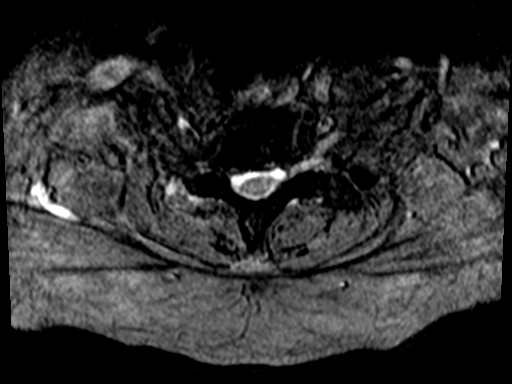
[im 8/23]
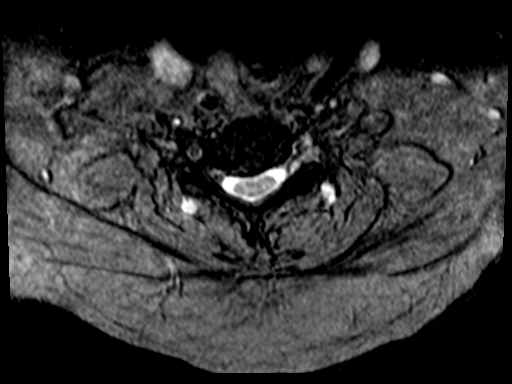
[im 10/23]
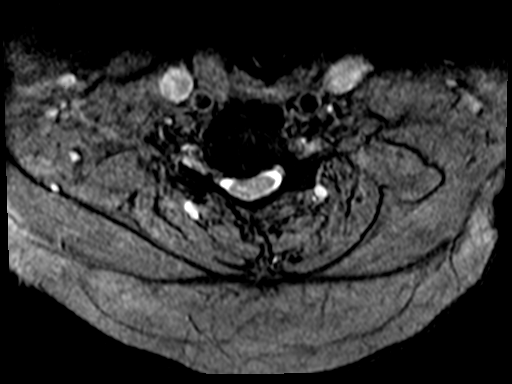
[im 13/23]
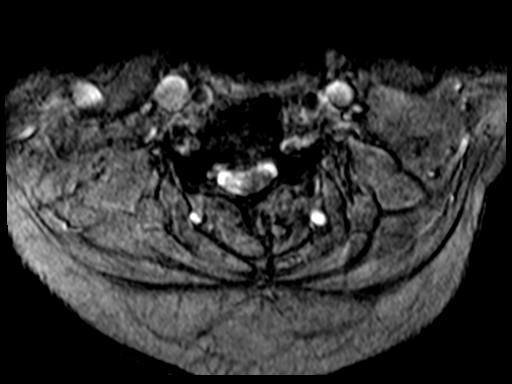
[im 15/23]
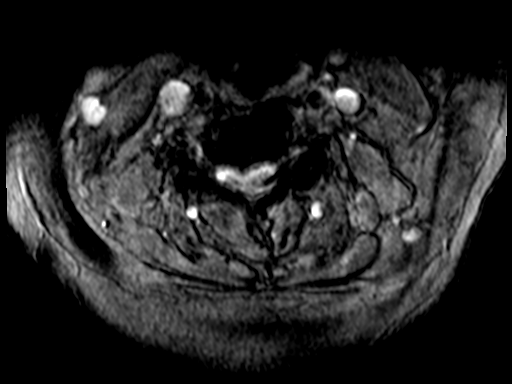
[im 19/23]
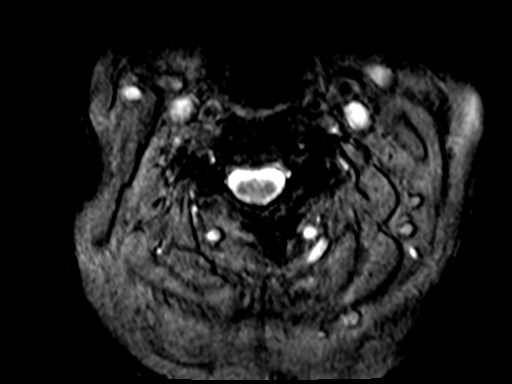
[im 23/23]
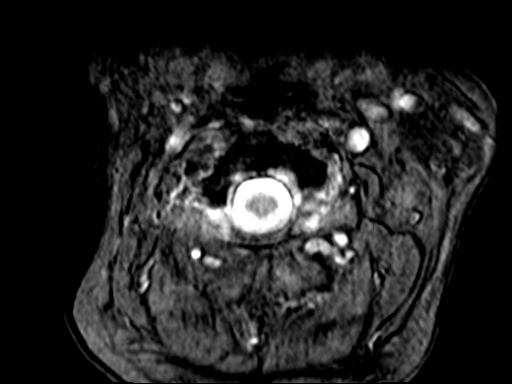

[40 of 48 positions shown; findings below may reference images not displayed]

FINDINGS: Alignment: Physiologic.

Vertebrae: No fracture, evidence of discitis, or bone lesion.

Cord: Mild T2 hyperintense signal in the cervical spinal cord at the
level C4-5 likely reflecting mild myelomalacia secondary to
degenerative disc disease.

Posterior Fossa, vertebral arteries, paraspinal tissues: Posterior
fossa demonstrates no focal abnormality. Vertebral artery flow voids
are maintained. Paraspinal soft tissues are unremarkable.

Disc levels:

Discs: Degenerative disease with disc height loss at C3-4, C4-5,
C5-6, C6-7, C7-T1, T1-2 and T2-3.

C2-3: No significant disc bulge. No neural foraminal stenosis. No
central canal stenosis. Mild left facet arthropathy.

C3-4: Broad-based disc bulge. Moderate left facet arthropathy.
Bilateral uncovertebral degenerative changes. Moderate right
foraminal narrowing. Severe left foraminal narrowing. Mild spinal
stenosis.

C4-5: Broad-based disc bulge with a broad central disc protrusion
compressing the cervical spinal cord. Severe spinal stenosis. Mild
right foraminal stenosis. Severe left foraminal stenosis.

C5-6: Broad-based disc osteophyte complex with a focal central
component. Mild bilateral foraminal narrowing. Moderate spinal
stenosis.

C6-7: Mild broad-based disc osteophyte complex. No neural foraminal
stenosis. No central canal stenosis.

C7-T1: Mild broad-based disc bulge. Bilateral uncovertebral
degenerative changes. Mild left foraminal narrowing. Moderate right
foraminal narrowing.

T1-2: Broad-based disc bulge. Bilateral uncovertebral degenerative
changes. Moderate bilateral facet arthropathy. Moderate left and
foraminal stenosis. Mild right foraminal stenosis.
IMPRESSION: 1. Cervical spine spondylosis as described above.
2.  No acute osseous injury of the cervical spine.
3. Mild myelomalacia of the cervical spinal cord at the level C4-5
as detailed above secondary to disc disease.

## 2023-04-20 DIAGNOSIS — S82035D Nondisplaced transverse fracture of left patella, subsequent encounter for closed fracture with routine healing: Secondary | ICD-10-CM | POA: Diagnosis not present

## 2023-05-13 ENCOUNTER — Other Ambulatory Visit: Payer: Self-pay | Admitting: Family Medicine

## 2023-05-13 DIAGNOSIS — G959 Disease of spinal cord, unspecified: Secondary | ICD-10-CM

## 2023-05-13 DIAGNOSIS — M19042 Primary osteoarthritis, left hand: Secondary | ICD-10-CM

## 2023-05-18 DIAGNOSIS — S82035D Nondisplaced transverse fracture of left patella, subsequent encounter for closed fracture with routine healing: Secondary | ICD-10-CM | POA: Diagnosis not present

## 2023-05-18 DIAGNOSIS — Z96653 Presence of artificial knee joint, bilateral: Secondary | ICD-10-CM | POA: Diagnosis not present

## 2023-05-18 DIAGNOSIS — M25562 Pain in left knee: Secondary | ICD-10-CM | POA: Diagnosis not present

## 2023-05-18 DIAGNOSIS — M25561 Pain in right knee: Secondary | ICD-10-CM | POA: Diagnosis not present

## 2023-05-25 ENCOUNTER — Other Ambulatory Visit: Payer: Self-pay | Admitting: Family Medicine

## 2023-05-25 DIAGNOSIS — I10 Essential (primary) hypertension: Secondary | ICD-10-CM

## 2023-06-14 ENCOUNTER — Other Ambulatory Visit: Payer: Self-pay | Admitting: Family Medicine

## 2023-06-14 DIAGNOSIS — E782 Mixed hyperlipidemia: Secondary | ICD-10-CM

## 2023-07-01 ENCOUNTER — Ambulatory Visit (INDEPENDENT_AMBULATORY_CARE_PROVIDER_SITE_OTHER): Payer: Self-pay | Admitting: Family Medicine

## 2023-07-01 ENCOUNTER — Encounter: Payer: Self-pay | Admitting: Family Medicine

## 2023-07-01 VITALS — BP 128/80 | HR 60 | Resp 16 | Ht 63.0 in | Wt 185.4 lb

## 2023-07-01 DIAGNOSIS — I10 Essential (primary) hypertension: Secondary | ICD-10-CM | POA: Diagnosis not present

## 2023-07-01 DIAGNOSIS — R718 Other abnormality of red blood cells: Secondary | ICD-10-CM

## 2023-07-01 DIAGNOSIS — G959 Disease of spinal cord, unspecified: Secondary | ICD-10-CM

## 2023-07-01 DIAGNOSIS — E782 Mixed hyperlipidemia: Secondary | ICD-10-CM | POA: Diagnosis not present

## 2023-07-01 DIAGNOSIS — K219 Gastro-esophageal reflux disease without esophagitis: Secondary | ICD-10-CM | POA: Diagnosis not present

## 2023-07-01 DIAGNOSIS — M19042 Primary osteoarthritis, left hand: Secondary | ICD-10-CM

## 2023-07-01 DIAGNOSIS — E559 Vitamin D deficiency, unspecified: Secondary | ICD-10-CM

## 2023-07-01 DIAGNOSIS — M48061 Spinal stenosis, lumbar region without neurogenic claudication: Secondary | ICD-10-CM

## 2023-07-01 DIAGNOSIS — M19041 Primary osteoarthritis, right hand: Secondary | ICD-10-CM | POA: Diagnosis not present

## 2023-07-01 MED ORDER — LOSARTAN POTASSIUM 50 MG PO TABS: 50.0000 mg | ORAL_TABLET | Freq: Every day | ORAL | 1 refills | Status: DC

## 2023-07-01 MED ORDER — ATORVASTATIN CALCIUM 40 MG PO TABS: 40.0000 mg | ORAL_TABLET | Freq: Every day | ORAL | 1 refills | Status: DC

## 2023-07-01 MED ORDER — CELECOXIB 200 MG PO CAPS: 200.0000 mg | ORAL_CAPSULE | Freq: Every day | ORAL | 0 refills | Status: DC

## 2023-07-01 NOTE — Progress Notes (Addendum)
 Name: Kari Lee   MRN: [id number]    DOB: [birth year]   Date:07/01/2023       Progress Note  Subjective  Chief Complaint  Chief Complaint  Patient presents with   Medical Management of Chronic Issues   HPI   HTN  She is taking  Losartan , she denies chest pain, palpitation of dizziness. Denies side effects of medication . BP is at goal. Continue medications    Elevated Red blood cells: she snores but feels rested during the day, we will monitor, we will recheck labs today     Dyslipidemia: LDL was at goal, she was contemplating stopping statin therapy but after discussion she will continue on medication for now    Bradycardia: she states her heart rate is always slow and seen by cardiologist - [place]  but negative evaluation. She states usually in the 60's Unchanged    Overweight: she joined [employer] 05/26/2017, she started at 200.2 lbs and she was down to 173 lbs but during the pandemic it went up to 193 lbs. She was down again to 170's but after fall and getting masters she has gained weight but will resume a healthier and diet and physical activity again    Post menopausal: she has a total hysterectomy, she has been on estrace  for a long time.    Elevated liver enzymes: over 100's , seen by Dr. Barnie Bora, [place]  showed fatty liver, changed diet and lost weight, liver enzymes normalized. She is gaining weight again , she will resume a healthier diet . 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 Mozell Arias and referred to Dr. Leeland Pugh. Unchanged    Cervical myelopathy: seen by Dr. Debrah Fan back in June 2022 and surgery was discussed versus surveillance She denies any weakness or numbness. She states that losing weight seems to have helped. Only occasionally has neck pain and usually when  not moving around   Recent fall and fracture of left patella, did not require surgery but had to wear a brace for two months, doing well now, she is going to follow up with Dr. Mozell Arias soon   Discussed the use of AI scribe software for clinical note transcription with the patient, who gave verbal consent to proceed.       Patient Active Problem List   Diagnosis Date Noted   Cervical myelopathy (HCC) 11/01/2021   S/P left rotator cuff repair 11/06/2020   Vitamin D  deficiency 12/26/2019   DDD (degenerative disc disease), lumbar 12/26/2019   Spinal stenosis of lumbar region without neurogenic claudication 12/26/2019   Osteoarthritis 08/19/2016   Bradycardia 05/20/2016   Current long-term use of postmenopausal hormone replacement therapy 05/20/2016   GERD without esophagitis 05/20/2016   Hyperlipidemia, unspecified 05/20/2016   Essential hypertension 05/20/2016   History of hip replacement, total, 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    Past Surgical History:  Procedure Laterality Date   CERVICAL CONE BIOPSY     COLONOSCOPY  10/2010   COLONOSCOPY  2004   endoscopic carpal tunn Bilateral    HALLUX VALGUS CORRECTION Right 2005   HYSTERECTOMY ABDOMINAL WITH SALPINGECTOMY  1981   INCISION TENDON SHEATH HAND Right    JOINT REPLACEMENT Right 11/21/2010   knee   JOINT REPLACEMENT Left 11/04/2011   hip  JOINT REPLACEMENT Left 04/27/2012   knee   KNEE ARTHROSCOPY Right    KNEE ARTHROSCOPY Left 12/18/2011   SHOULDER ARTHROSCOPY WITH SUBACROMIAL DECOMPRESSION, ROTATOR CUFF REPAIR AND BICEP TENDON REPAIR Left 09/20/2020   Procedure: SHOULDER ARTHROSCOPY WITH DEBRIDEMENT, DECOMPRESSION, ROTATOR CUFF REPAIR AND BICEPS TENODESIS;  Surgeon: Elner Hahn, MD;  Location: [facility] OR[facility];  Service: Orthopedics;  Laterality: Left;    Family History  Problem Relation Age of Onset   Alzheimer's disease Mother 70   Osteoporosis Mother    Stroke Father 49   Arthritis Sister    Basal cell carcinoma Sister    Breast cancer  Maternal Aunt 7   Macular degeneration Other     Social History   Tobacco Use   Smoking status: Never   Smokeless tobacco: Never  Substance Use Topics   Alcohol use: Yes    Alcohol/week: 1.0 standard drink of alcohol    Types: 1 Glasses of wine per week     Current Outpatient Medications:    acetaminophen  (TYLENOL ) 500 MG tablet, Take 1 tablet (500 mg total) by mouth 2 (two) times daily., Disp: 60 tablet, Rfl: 0   Ascorbic Acid (VITAMIN C) 1000 MG tablet, Take 1,000 mg by mouth daily., Disp: , Rfl:    atorvastatin  (LIPITOR) 40 MG tablet, TAKE 1 TABLET BY MOUTH EVERY DAY, Disp: 30 tablet, Rfl: 0   celecoxib  (CELEBREX ) 100 MG capsule, TAKE 1 CAPSULE BY MOUTH TWICE A DAY, Disp: 180 capsule, Rfl: 0   diclofenac  Sodium (VOLTAREN ) 1 % GEL, Apply topically 4 (four) times daily. As needed, Disp: , Rfl:    estradiol  (ESTRACE ) 0.5 MG tablet, Take 1 tablet (0.5 mg total) by mouth daily., Disp: 90 tablet, Rfl: 3   losartan  (COZAAR ) 50 MG tablet, TAKE 1 TABLET BY MOUTH EVERY DAY, Disp: 90 tablet, Rfl: 0   Magnesium 250 MG TABS, Take 250 mg by mouth at bedtime., Disp: , Rfl:    Multiple Vitamin (MULTIVITAMIN) tablet, Take 1 tablet by mouth daily., Disp: , Rfl:    Vitamin D , Cholecalciferol, 50 MCG (2000 UT) CAPS, Take 2,000 Units by mouth daily., Disp: , Rfl:   Allergies  Allergen Reactions   Sulfa Antibiotics Nausea Only   Penicillins Rash    TOLERATED CEFAZOLIN     I personally reviewed active problem list, medication list, allergies, family history with the patient/caregiver today.   ROS  Ten systems reviewed and is negative except as mentioned in HPI    Objective Physical Exam Constitutional: Patient appears well-developed and well-nourished. Obese  No distress.  HEENT: head atraumatic, normocephalic, pupils equal and reactive to light, neck supple Cardiovascular: Normal rate, regular rhythm and normal heart sounds.  No murmur heard. No BLE edema. Pulmonary/Chest: Effort normal  and breath sounds normal. No respiratory distress. Abdominal: Soft.  There is no tenderness. Psychiatric: Patient has a normal mood and affect. behavior is normal. Judgment and thought content normal.   Vitals:   07/01/23 0823  BP: 128/80  Pulse: 60  Resp: 16  SpO2: 94%  Weight: 185 lb 6.4 oz (84.1 kg)  Height: 5\' 3"  (1.6 m)    Body mass index is 32.84 kg/m.   PHQ2/9:    07/01/2023    8:23 AM 12/30/2022   11:01 AM 10/10/2022    8:43 AM 06/26/2022   10:57 AM 05/07/2022    1:53 PM  Depression screen PHQ 2/9  Decreased Interest 0 0 0 0 0  Down, Depressed, Hopeless 0 0 0 0 0  PHQ -  2 Score 0 0 0 0 0  Altered sleeping 0 0 0  0  Tired, decreased energy 0 0 0  0  Change in appetite 0 0 0  0  Feeling bad or failure about yourself  0 0 0  0  Trouble concentrating 0 0 0  0  Moving slowly or fidgety/restless 0 0 0  0  Suicidal thoughts 0 0 0  0  PHQ-9 Score 0 0 0  0  Difficult doing work/chores Not difficult at all Not difficult at all       phq 9 is negative  Fall Risk:    07/01/2023    8:22 AM 06/28/2023    2:10 PM 12/30/2022   11:01 AM 10/10/2022    8:42 AM 06/25/2022   11:35 PM  Fall Risk   Falls in the past year? 1 1 1 1 1   Number falls in past yr: 0 0 0 1 1  Injury with Fall? 1 1 0 1 1  Risk for fall due to : Impaired balance/gait  Impaired balance/gait No Fall Risks   Follow up Education provided;Falls evaluation completed;Falls prevention discussed  Falls prevention discussed;Education provided;Falls evaluation completed Falls prevention discussed Education provided;Falls prevention discussed     Assessment & Plan  1. Cervical myelopathy (HCC) (Primary)  - celecoxib  (CELEBREX ) 200 MG capsule; Take 1 capsule (200 mg total) by mouth daily.  Dispense: 90 capsule; Refill: 0  2. Essential hypertension  - CBC with Differential/Platelet - Comprehensive metabolic panel with GFR - losartan  (COZAAR ) 50 MG tablet; Take 1 tablet (50 mg total) by mouth daily.  Dispense: 90  tablet; Refill: 1  3. GERD without esophagitis  Doing well at this time  4. Primary osteoarthritis of both hands  - celecoxib  (CELEBREX ) 200 MG capsule; Take 1 capsule (200 mg total) by mouth daily.  Dispense: 90 capsule; Refill: 0  5. Mixed hyperlipidemia  - Lipid panel - atorvastatin  (LIPITOR) 40 MG tablet; Take 1 tablet (40 mg total) by mouth daily.  Dispense: 90 tablet; Refill: 1  6. Vitamin D  deficiency  - VITAMIN D  25 Hydroxy (Vit-D Deficiency, Fractures)  7. Spinal stenosis of lumbar region without neurogenic claudication  Stable   8. High hematocrit   Recheck labs

## 2023-07-02 ENCOUNTER — Ambulatory Visit: Payer: Self-pay | Admitting: Family Medicine

## 2023-07-02 ENCOUNTER — Ambulatory Visit: Payer: Medicare HMO

## 2023-07-02 DIAGNOSIS — Z0000 Encounter for general adult medical examination without abnormal findings: Secondary | ICD-10-CM | POA: Diagnosis not present

## 2023-07-02 LAB — LIPID PANEL
Cholesterol: 167 mg/dL (ref <200)
HDL: 78 mg/dL (ref >=50)
LDL Cholesterol (Calc): 68 mg/dL
Non-HDL Cholesterol (Calc): 89 mg/dL (ref <130)
Total CHOL/HDL Ratio: 2.1 (calc) (ref <5.0)
Triglycerides: 128 mg/dL (ref <150)

## 2023-07-02 LAB — CBC WITH DIFFERENTIAL/PLATELET
Absolute Lymphocytes: 1642 {cells}/uL (ref 850–3900)
Absolute Monocytes: 538 {cells}/uL (ref 200–950)
Basophils Absolute: 38 {cells}/uL (ref 0–200)
Basophils Relative: 0.8 %
Eosinophils Absolute: 360 {cells}/uL (ref 15–500)
Eosinophils Relative: 7.5 %
HCT: 47.4 % — ABNORMAL HIGH (ref 35.0–45.0)
Hemoglobin: 15.2 g/dL (ref 11.7–15.5)
MCH: 29.9 pg (ref 27.0–33.0)
MCHC: 32.1 g/dL (ref 32.0–36.0)
MCV: 93.3 fL (ref 80.0–100.0)
MPV: 10.9 fL (ref 7.5–12.5)
Monocytes Relative: 11.2 %
Neutro Abs: 2222 {cells}/uL (ref 1500–7800)
Neutrophils Relative %: 46.3 %
Platelets: 186 10*3/uL (ref 140–400)
RBC: 5.08 10*6/uL (ref 3.80–5.10)
RDW: 12.8 % (ref 11.0–15.0)
Total Lymphocyte: 34.2 %
WBC: 4.8 10*3/uL (ref 3.8–10.8)

## 2023-07-02 LAB — COMPREHENSIVE METABOLIC PANEL WITH GFR
AG Ratio: 1.7 (calc) (ref 1.0–2.5)
ALT: 28 U/L (ref 6–29)
AST: 27 U/L (ref 10–35)
Albumin: 4.4 g/dL (ref 3.6–5.1)
Alkaline phosphatase (APISO): 60 U/L (ref 37–153)
BUN: 18 mg/dL (ref 7–25)
CO2: 30 mmol/L (ref 20–32)
Calcium: 9.6 mg/dL (ref 8.6–10.4)
Chloride: 105 mmol/L (ref 98–110)
Creat: 0.72 mg/dL (ref 0.60–1.00)
Globulin: 2.6 g/dL (ref 1.9–3.7)
Glucose, Bld: 99 mg/dL (ref 65–99)
Potassium: 4.7 mmol/L (ref 3.5–5.3)
Sodium: 141 mmol/L (ref 135–146)
Total Bilirubin: 0.8 mg/dL (ref 0.2–1.2)
Total Protein: 7 g/dL (ref 6.1–8.1)
eGFR: 87 mL/min/{1.73_m2} (ref >=60)

## 2023-07-02 LAB — VITAMIN D 25 HYDROXY (VIT D DEFICIENCY, FRACTURES): Vit D, 25-Hydroxy: 47 ng/mL (ref 30–100)

## 2023-07-02 NOTE — Patient Instructions (Addendum)
 Kari Lee , Thank you for taking time out of your busy schedule to complete your Annual Wellness Visit with me. I enjoyed our conversation and look forward to speaking with you again next year. I, as well as your care team,  appreciate your ongoing commitment to your health goals. Please review the following plan we discussed and let me know if I can assist you in the future.   Follow up Visits: Next Medicare AWV with our clinical staff:   07/14/24 @ 11:30 AM BY PHONE Have you seen your provider in the last 6 months (3 months if uncontrolled diabetes)? Yes  Clinician Recommendations:  Aim for 30 minutes of exercise or brisk walking, 6-8 glasses of water, and 5 servings of fruits and vegetables each day. TAKE CARE!      This is a list of the screening recommended for you and due dates:  Health Maintenance  Topic Date Due   COVID-19 Vaccine (3 - 2024-25 season) 07/17/2023*   Zoster (Shingles) Vaccine (1 of 2) 10/01/2023*   Flu Shot  08/28/2023   Mammogram  12/23/2023   Cologuard (Stool DNA test)  05/07/2024   Medicare Annual Wellness Visit  07/01/2024   DEXA scan (bone density measurement)  09/03/2026   DTaP/Tdap/Td vaccine (3 - Td or Tdap) 12/25/2029   Pneumonia Vaccine  Completed   Hepatitis C Screening  Completed   HPV Vaccine  Aged Out   Meningitis B Vaccine  Aged Out   Colon Cancer Screening  Discontinued  *Topic was postponed. The date shown is not the original due date.    Advanced directives: (ACP Link)Information on Advanced Care Planning can be found at [place]  Secretary of [facility] Advance Health Care Directives Advance Health Care Directives. [email or web address]  Advance Care Planning is important because it:  [x]  Makes sure you receive the medical care that is consistent with your values, goals, and preferences  [x]  It provides guidance to your family and loved ones and reduces their decisional burden about whether or not they are making the right decisions based on your  wishes.  Follow the link provided in your after visit summary or read over the paperwork we have mailed to you to help you started getting your Advance Directives in place. If you need assistance in completing these, please reach out to us  so that we can help you!

## 2023-07-02 NOTE — Progress Notes (Signed)
 Subjective:   Kari Lee is a 78 y.o. who presents for a Medicare Wellness preventive visit.  As a reminder, Annual Wellness Visits don't include a physical exam, and some assessments may be limited, especially if this visit is performed virtually. We may recommend an in-person follow-up visit with your provider if needed.  Visit Complete: Virtual I connected with  Kari Lee on 07/02/23 by a audio enabled telemedicine application and verified that I am speaking with the correct person using two identifiers.  Patient Location: Home  Provider Location: Office/Clinic  I discussed the limitations of evaluation and management by telemedicine. The patient expressed understanding and agreed to proceed.  Vital Signs: Because this visit was a virtual/telehealth visit, some criteria may be missing or patient reported. Any vitals not documented were not able to be obtained and vitals that have been documented are patient reported.  VideoDeclined- This patient declined Librarian, academic. Therefore the visit was completed with audio only.  Persons Participating in Visit: Patient.  AWV Questionnaire: No: Patient Medicare AWV questionnaire was not completed prior to this visit.  Cardiac Risk Factors include: advanced age (>12men, >29 women);hypertension;dyslipidemia;obesity (BMI >30kg/m2);sedentary lifestyle     Objective:     Today's Vitals   07/02/23 1012  PainSc: 3    There is no height or weight on file to calculate BMI.     07/02/2023   10:18 AM 06/26/2022   10:58 AM 06/20/2021    9:11 AM 09/20/2020    9:30 AM 09/11/2020   11:06 AM 06/12/2020    8:34 AM 05/17/2019    8:49 AM  Advanced Directives  Does Patient Have a Medical Advance Directive? No No Yes No No No Yes  Type of Surveyor, minerals;Living will    Healthcare Power of [place];Living will  Does patient want to make changes to medical advance directive?        Yes (MAU/Ambulatory/Procedural Areas - Information given)  Copy of Healthcare Power of Attorney in Chart?   No - copy requested    No - copy requested  Would patient like information on creating a medical advance directive? No - Patient declined   No - Patient declined  Yes (MAU/Ambulatory/Procedural Areas - Information given)     Current Medications (verified) Outpatient Encounter Medications as of 07/02/2023  Medication Sig   acetaminophen  (TYLENOL ) 500 MG tablet Take 1 tablet (500 mg total) by mouth 2 (two) times daily.   Ascorbic Acid (VITAMIN C) 1000 MG tablet Take 1,000 mg by mouth daily.   atorvastatin  (LIPITOR) 40 MG tablet Take 1 tablet (40 mg total) by mouth daily.   celecoxib  (CELEBREX ) 200 MG capsule Take 1 capsule (200 mg total) by mouth daily.   diclofenac  Sodium (VOLTAREN ) 1 % GEL Apply topically 4 (four) times daily. As needed   estradiol  (ESTRACE ) 0.5 MG tablet Take 1 tablet (0.5 mg total) by mouth daily.   losartan  (COZAAR ) 50 MG tablet Take 1 tablet (50 mg total) by mouth daily.   Magnesium 250 MG TABS Take 250 mg by mouth at bedtime.   Multiple Vitamin (MULTIVITAMIN) tablet Take 1 tablet by mouth daily.   Vitamin D , Cholecalciferol, 50 MCG (2000 UT) CAPS Take 2,000 Units by mouth daily.   No facility-administered encounter medications on file as of 07/02/2023.    Allergies (verified) Sulfa antibiotics and Penicillins   History: Past Medical History:  Diagnosis Date   Anxiety    Arthritis  Bradycardia    Bronchitis    Complication of anesthesia    woke up during hip surgery   Dysrhythmia    congenital bradycardia   GERD (gastroesophageal reflux disease)    History of positive PPD 2009   History of positive PPD 2009   INH through health dept CXR's needed for clearance   Hyperlipidemia    Hypertension    Insomnia    Lumbar spondylosis    Managed by Dr. Molli Angelucci with [facility]   Obesity    Rosacea    Spinal stenosis    Spondylosis of cervical spine     Past Surgical History:  Procedure Laterality Date   CERVICAL CONE BIOPSY     COLONOSCOPY  10/2010   COLONOSCOPY  2004   endoscopic carpal tunn Bilateral    HALLUX VALGUS CORRECTION Right 2005   HYSTERECTOMY ABDOMINAL WITH SALPINGECTOMY  1981   INCISION TENDON SHEATH HAND Right    JOINT REPLACEMENT Right 11/21/2010   knee   JOINT REPLACEMENT Left 11/04/2011   hip   JOINT REPLACEMENT Left 04/27/2012   knee   KNEE ARTHROSCOPY Right    KNEE ARTHROSCOPY Left 12/18/2011   SHOULDER ARTHROSCOPY WITH SUBACROMIAL DECOMPRESSION, ROTATOR CUFF REPAIR AND BICEP TENDON REPAIR Left 09/20/2020   Procedure: SHOULDER ARTHROSCOPY WITH DEBRIDEMENT, DECOMPRESSION, ROTATOR CUFF REPAIR AND BICEPS TENODESIS;  Surgeon: Elner Hahn, MD;  Location: [facility] ORS;  Service: Orthopedics;  Laterality: Left;   Family History  Problem Relation Age of Onset   Alzheimer's disease Mother 73   Osteoporosis Mother    Stroke Father 75   Arthritis Sister    Basal cell carcinoma Sister    Breast cancer Maternal Aunt 47   Macular degeneration Other    Social History   Socioeconomic History   Marital status: Married    Spouse name: Andy Bannister   Number of children: 2   Years of education: Not on file   Highest education level: Master's degree (e.g., MA, MS, MEng, MEd, MSW, MBA)  Occupational History   Occupation: Retired    Comment: Nurse  Tobacco Use   Smoking status: Never   Smokeless tobacco: Never  Vaping Use   Vaping status: Never Used  Substance and Sexual Activity   Alcohol use: Yes    Alcohol/week: 1.0 standard drink of alcohol    Types: 1 Glasses of wine per week   Drug use: No   Sexual activity: Yes    Partners: Male    Birth control/protection: None, Surgical, Post-menopausal  Other Topics Concern   Not on file  Social History Narrative   She moved to [place] to downsize, but decided to move back to [place] after 2 years.    Married.   One daughter lives in [place].    The other daughter  in [place] .   Social Drivers of Corporate investment banker Strain: Low Risk  (07/02/2023)   Overall Financial Resource Strain (CARDIA)    Difficulty of Paying Living Expenses: Not hard at all  Food Insecurity: No Food Insecurity (07/02/2023)   Hunger Vital Sign    Worried About Running Out of Food in the Last Year: Never true    Ran Out of Food in the Last Year: Never true  Transportation Needs: No Transportation Needs (07/02/2023)   PRAPARE - Administrator, Civil Service (Medical): No    Lack of Transportation (Non-Medical): No  Physical Activity: Inactive (07/02/2023)   Exercise Vital Sign    Days of Exercise  per Week: 0 days    Minutes of Exercise per Session: 0 min  Stress: No Stress Concern Present (07/02/2023)   [employer] of Occupational Health - Occupational Stress Questionnaire    Feeling of Stress : Only a little  Social Connections: Socially Integrated (07/02/2023)   Social Connection and Isolation Panel [NHANES]    Frequency of Communication with Friends and Family: More than three times a week    Frequency of Social Gatherings with Friends and Family: Twice a week    Attends Religious Services: More than 4 times per year    Active Member of [employer] or Organizations: Yes    Attends Engineer, structural: More than 4 times per year    Marital Status: Married    Tobacco Counseling Counseling given: Not Answered    Clinical Intake:  Pre-visit preparation completed: Yes  Pain : 0-10 Pain Score: 3  Pain Type: Chronic pain Pain Location: Hand Pain Orientation: Right, Left Pain Descriptors / Indicators: Sore, Constant Pain Onset: More than a month ago Pain Frequency: Constant     BMI - recorded: 32.8 Nutritional Status: BMI > 30  Obese Nutritional Risks: None Diabetes: No  Lab Results  Component Value Date   HGBA1C 5.4 05/20/2016     How often do you need to have someone help you when you read instructions, pamphlets, or other written  materials from your doctor or pharmacy?: 1 - Never  Interpreter Needed?: No  Information entered by :: Dellie Fergusson, LPN   Activities of Daily Living    07/02/2023   10:20 AM 06/28/2023    2:10 PM  In your present state of health, do you have any difficulty performing the following activities:  Hearing? 0 0  Vision? 0 0  Difficulty concentrating or making decisions? 0 0  Walking or climbing stairs? 0 0  Dressing or bathing? 0 0  Doing errands, shopping? 0 0  Preparing Food and eating ? N N  Using the Toilet? N N  In the past six months, have you accidently leaked urine? Y Y  Do you have problems with loss of bowel control? N N  Managing your Medications? N N  Managing your Finances? N N  Housekeeping or managing your Housekeeping? N N    Patient Care Team: Sowles, Krichna, MD as PCP - General (Family Medicine) [facility], [facility]  I have updated your Care Teams any recent Medical Services you may have received from other providers in the past year.     Assessment:    This is a routine wellness examination for Amirah.  Hearing/Vision screen Hearing Screening - Comments:: NO AIDS Vision Screening - Comments:: WEARS CONTACTS- DR.BENFIELD   Goals Addressed             This Visit's Progress    DIET - EAT MORE FRUITS AND VEGETABLES         Depression Screen     07/02/2023   10:16 AM 07/01/2023    8:23 AM 12/30/2022   11:01 AM 10/10/2022    8:43 AM 06/26/2022   10:57 AM 05/07/2022    1:53 PM 04/09/2022    8:10 AM  PHQ 2/9 Scores  PHQ - 2 Score 0 0 0 0 0 0 0  PHQ- 9 Score 0 0 0 0  0 0    Fall Risk     07/02/2023   10:20 AM 07/01/2023    8:22 AM 06/28/2023    2:10 PM 12/30/2022  11:01 AM 10/10/2022    8:42 AM  Fall Risk   Falls in the past year? 1 1 1 1 1   Number falls in past yr: 1 0 0 0 1  Injury with Fall? 1 1 1  0 1  Risk for fall due to : History of fall(s);Impaired balance/gait Impaired balance/gait  Impaired balance/gait No Fall Risks  Follow up  Falls evaluation completed;Falls prevention discussed Education provided;Falls evaluation completed;Falls prevention discussed  Falls prevention discussed;Education provided;Falls evaluation completed Falls prevention discussed    MEDICARE RISK AT HOME:  Medicare Risk at Home Any stairs in or around the home?: Yes If so, are there any without handrails?: No Home free of loose throw rugs in walkways, pet beds, electrical cords, etc?: Yes Adequate lighting in your home to reduce risk of falls?: Yes Life alert?: No Use of a cane, walker or w/c?: No Grab bars in the bathroom?: Yes Shower chair or bench in shower?: No Elevated toilet seat or a handicapped toilet?: Yes  TIMED UP AND GO:  Was the test performed?  No  Cognitive Function: 6CIT completed        07/02/2023   10:21 AM 06/26/2022   11:08 AM 02/19/2018    9:38 AM  6CIT Screen  What Year? 0 points 0 points 0 points  What month? 0 points 0 points 0 points  What time? 0 points 0 points 0 points  Count back from 20 0 points 0 points 0 points  Months in reverse 0 points 0 points 0 points  Repeat phrase 0 points 0 points 2 points  Total Score 0 points 0 points 2 points    Immunizations Immunization History  Administered Date(s) Administered   Fluad Quad(high Dose 65+) 10/29/2018, 11/06/2020   Influenza, High Dose Seasonal PF 11/19/2016, 10/20/2017   Influenza,inj,Quad PF,6+ Mos 12/26/2019   Influenza-Unspecified 01/05/2015   PFIZER(Purple Top)SARS-COV-2 Vaccination 03/09/2019, 03/30/2019   Pneumococcal Conjugate-13 07/03/2014   Pneumococcal Polysaccharide-23 11/03/2011, 11/19/2016   Td 08/13/2009   Tdap 12/26/2019   Zoster, Live 02/19/2012    Screening Tests Health Maintenance  Topic Date Due   COVID-19 Vaccine (3 - 2024-25 season) 07/17/2023 (Originally 09/28/2022)   Zoster Vaccines- Shingrix (1 of 2) 10/01/2023 (Originally 07/20/1997)   INFLUENZA VACCINE  08/28/2023   MAMMOGRAM  12/23/2023   Fecal DNA (Cologuard)   05/07/2024   Medicare Annual Wellness (AWV)  07/01/2024   DEXA SCAN  09/03/2026   DTaP/Tdap/Td (3 - Td or Tdap) 12/25/2029   Pneumonia Vaccine 31+ Years old  Completed   Hepatitis C Screening  Completed   HPV VACCINES  Aged Out   Meningococcal B Vaccine  Aged Out   Colonoscopy  Discontinued    Health Maintenance  There are no preventive care reminders to display for this patient. Health Maintenance Items Addressed: UP TO DATE ON MAMMOGRAM, COLOGUARD & BDS; DECLINES COVID & [facility]  Additional Screening:  Vision Screening: Recommended annual ophthalmology exams for early detection of glaucoma and other disorders of the eye. Would you like a referral to an eye doctor? No    Dental Screening: Recommended annual dental exams for proper oral hygiene  Community Resource Referral / Chronic Care Management: CRR required this visit?  No   CCM required this visit?  No   Plan:    I have personally reviewed and noted the following in the patient's chart:   Medical and social history Use of alcohol, tobacco or illicit drugs  Current medications and supplements including opioid prescriptions. Patient is  not currently taking opioid prescriptions. Functional ability and status Nutritional status Physical activity Advanced directives List of other physicians Hospitalizations, surgeries, and ER visits in previous 12 months Vitals Screenings to include cognitive, depression, and falls Referrals and appointments  In addition, I have reviewed and discussed with patient certain preventive protocols, quality metrics, and best practice recommendations. A written personalized care plan for preventive services as well as general preventive health recommendations were provided to patient.   Pinky Bright, LPN   02/01/1094   After Visit Summary: (MyChart) Due to this being a telephonic visit, the after visit summary with patients personalized plan was offered to patient via MyChart    Notes: Nothing significant to report at this time.

## 2023-07-20 DIAGNOSIS — M545 Low back pain, unspecified: Secondary | ICD-10-CM | POA: Diagnosis not present

## 2023-07-20 DIAGNOSIS — G8929 Other chronic pain: Secondary | ICD-10-CM | POA: Diagnosis not present

## 2023-07-20 DIAGNOSIS — M1611 Unilateral primary osteoarthritis, right hip: Secondary | ICD-10-CM | POA: Diagnosis not present

## 2023-07-20 DIAGNOSIS — M9963 Osseous and subluxation stenosis of intervertebral foramina of lumbar region: Secondary | ICD-10-CM | POA: Diagnosis not present

## 2023-07-20 DIAGNOSIS — S82035D Nondisplaced transverse fracture of left patella, subsequent encounter for closed fracture with routine healing: Secondary | ICD-10-CM | POA: Diagnosis not present

## 2023-08-04 DIAGNOSIS — M25511 Pain in right shoulder: Secondary | ICD-10-CM | POA: Diagnosis not present

## 2023-08-04 DIAGNOSIS — M542 Cervicalgia: Secondary | ICD-10-CM | POA: Diagnosis not present

## 2023-08-05 DIAGNOSIS — M47816 Spondylosis without myelopathy or radiculopathy, lumbar region: Secondary | ICD-10-CM | POA: Diagnosis not present

## 2023-08-05 DIAGNOSIS — M48061 Spinal stenosis, lumbar region without neurogenic claudication: Secondary | ICD-10-CM | POA: Diagnosis not present

## 2023-08-05 DIAGNOSIS — M4807 Spinal stenosis, lumbosacral region: Secondary | ICD-10-CM | POA: Diagnosis not present

## 2023-08-12 ENCOUNTER — Other Ambulatory Visit: Payer: Self-pay | Admitting: Orthopedic Surgery

## 2023-08-12 DIAGNOSIS — M4802 Spinal stenosis, cervical region: Secondary | ICD-10-CM

## 2023-08-14 ENCOUNTER — Ambulatory Visit
Admission: RE | Admit: 2023-08-14 | Discharge: 2023-08-14 | Disposition: A | Discharge status: Home / Self Care | Source: Ambulatory Visit | Attending: Orthopedic Surgery | Admitting: Orthopedic Surgery

## 2023-08-14 DIAGNOSIS — M4182 Other forms of scoliosis, cervical region: Secondary | ICD-10-CM | POA: Diagnosis not present

## 2023-08-14 DIAGNOSIS — M4802 Spinal stenosis, cervical region: Secondary | ICD-10-CM | POA: Insufficient documentation

## 2023-08-14 DIAGNOSIS — M4319 Spondylolisthesis, multiple sites in spine: Secondary | ICD-10-CM | POA: Diagnosis not present

## 2023-08-14 DIAGNOSIS — M5031 Other cervical disc degeneration,  high cervical region: Secondary | ICD-10-CM | POA: Diagnosis not present

## 2023-08-14 NOTE — Progress Notes (Unsigned)
 Referring Physician:  Sowles, Krichna, MD [address] [place],  [place] [address]  Primary Physician:  Sowles, Krichna, MD  History of Present Illness: 08/14/2023 [name] is here today with a chief complaint of ***  Neck pain, numbness in fingers in R hand, off/on numbness in knee on R side ? Arm pain?  Duration: *** Location: *** Quality: *** Severity: ***  Precipitating: aggravated by *** Modifying factors: made better by *** Weakness: none Timing: *** Bowel/Bladder Dysfunction: none  Conservative measures:  Physical therapy: *** has not participated in Multimodal medical therapy including regular antiinflammatories: *** Celebrex , Tylenol , Voltaren , Norco, Prednisone  Injections:  09/21/2019: Bilateral L5-S1 transforaminal ESI  08/19/2019: Bilateral L5-S1 transforaminal ESI  08/19/2011: Left hip joint injection 12/31/2010: Left C3-4 transforaminal ESI   Past Surgery: ***no spine or neck surgery  Kari Lee has ***no symptoms of cervical myelopathy.  The symptoms are causing a significant impact on the patient's life.   I have utilized the care everywhere function in epic to review the outside records available from external health systems.  Review of Systems:  A 10 point review of systems is negative, except for the pertinent positives and negatives detailed in the HPI.  Past Medical History: Past Medical History:  Diagnosis Date   Anxiety    Arthritis    Bradycardia    Bronchitis    Complication of anesthesia    woke up during hip surgery   Dysrhythmia    congenital bradycardia   GERD (gastroesophageal reflux disease)    History of positive PPD 2009   History of positive PPD 2009   INH through health dept CXR's needed for clearance   Hyperlipidemia    Hypertension    Insomnia    Lumbar spondylosis    Managed by Kari Lee with [facility]   Obesity    Rosacea    Spinal stenosis    Spondylosis of cervical spine      Past Surgical History: Past Surgical History:  Procedure Laterality Date   CERVICAL CONE BIOPSY     COLONOSCOPY  10/2010   COLONOSCOPY  2004   endoscopic carpal tunn Bilateral    HALLUX VALGUS CORRECTION Right 2005   HYSTERECTOMY ABDOMINAL WITH SALPINGECTOMY  1981   INCISION TENDON SHEATH HAND Right    JOINT REPLACEMENT Right 11/21/2010   knee   JOINT REPLACEMENT Left 11/04/2011   hip   JOINT REPLACEMENT Left 04/27/2012   knee   KNEE ARTHROSCOPY Right    KNEE ARTHROSCOPY Left 12/18/2011   SHOULDER ARTHROSCOPY WITH SUBACROMIAL DECOMPRESSION, ROTATOR CUFF REPAIR AND BICEP TENDON REPAIR Left 09/20/2020   Procedure: SHOULDER ARTHROSCOPY WITH DEBRIDEMENT, DECOMPRESSION, ROTATOR CUFF REPAIR AND BICEPS TENODESIS;  Surgeon: Edie Norleen PARAS, MD;  Location: [facility] ORS;  Service: Orthopedics;  Laterality: Left;    Allergies: Allergies as of 08/19/2023 - Review Complete 07/02/2023  Allergen Reaction Noted   Sulfa antibiotics Nausea Only 05/20/2016   Penicillins Rash 05/20/2016    Medications:  Current Outpatient Medications:    acetaminophen  (TYLENOL ) 500 MG tablet, Take 1 tablet (500 mg total) by mouth 2 (two) times daily., Disp: 60 tablet, Rfl: 0   Ascorbic Acid (VITAMIN C) 1000 MG tablet, Take 1,000 mg by mouth daily., Disp: , Rfl:    atorvastatin  (LIPITOR) 40 MG tablet, Take 1 tablet (40 mg total) by mouth daily., Disp: 90 tablet, Rfl: 1   celecoxib  (CELEBREX ) 200 MG capsule, Take 1 capsule (200 mg total) by mouth daily., Disp:  90 capsule, Rfl: 0   diclofenac  Sodium (VOLTAREN ) 1 % GEL, Apply topically 4 (four) times daily. As needed, Disp: , Rfl:    estradiol  (ESTRACE ) 0.5 MG tablet, Take 1 tablet (0.5 mg total) by mouth daily., Disp: 90 tablet, Rfl: 3   losartan  (COZAAR ) 50 MG tablet, Take 1 tablet (50 mg total) by mouth daily., Disp: 90 tablet, Rfl: 1   Magnesium 250 MG TABS, Take 250 mg by mouth at bedtime., Disp: , Rfl:    Multiple Vitamin (MULTIVITAMIN) tablet, Take 1 tablet by  mouth daily., Disp: , Rfl:    Vitamin D , Cholecalciferol, 50 MCG (2000 UT) CAPS, Take 2,000 Units by mouth daily., Disp: , Rfl:   Social History: Social History   Tobacco Use   Smoking status: Never   Smokeless tobacco: Never  Vaping Use   Vaping status: Never Used  Substance Use Topics   Alcohol use: Yes    Alcohol/week: 1.0 standard drink of alcohol    Types: 1 Glasses of wine per week   Drug use: No    Family Medical History: Family History  Problem Relation Age of Onset   Alzheimer's disease Mother 25   Osteoporosis Mother    Stroke Father 6   Arthritis Sister    Basal cell carcinoma Sister    Breast cancer Maternal Aunt 77   Macular degeneration Other     Physical Examination: There were no vitals filed for this visit.  General: Patient is in no apparent distress. Attention to examination is appropriate.  Neck:   Supple.  Full range of motion.  Respiratory: Patient is breathing without any difficulty.   NEUROLOGICAL:     Awake, alert, oriented to person, place, and time.  Speech is clear and fluent.   Cranial Nerves: Pupils equal round and reactive to light.  Facial tone is symmetric.  Facial sensation is symmetric. Shoulder shrug is symmetric. Tongue protrusion is midline.    Strength: Side Biceps Triceps Deltoid Interossei Grip Wrist Ext. Wrist Flex.  R 5 5 5 5 5 5 5   L 5 5 5 5 5 5 5    Side Iliopsoas Quads Hamstring PF DF EHL  R 5 5 5 5 5 5   L 5 5 5 5 5 5    Reflexes are ***2+ and symmetric at the biceps, triceps, brachioradialis, patella and achilles.   Hoffman's is absent. Clonus is absent  Bilateral upper and lower extremity sensation is intact to light touch ***.     No evidence of dysmetria noted.  Gait is normal.    Imaging: *** I have personally reviewed the images and agree with the above interpretation.  Medical Decision Making/Assessment and Plan: [name] is a pleasant 78 y.o. female with ***  There are no diagnoses linked to  this encounter.   Thank you for involving me in the care of this patient.    Kari MICAEL Sharps MD/MSCR Neurosurgery

## 2023-08-18 ENCOUNTER — Other Ambulatory Visit: Payer: Self-pay | Admitting: Family Medicine

## 2023-08-18 ENCOUNTER — Inpatient Hospital Stay
Admission: RE | Admit: 2023-08-18 | Discharge: 2023-08-18 | Disposition: A | Discharge status: Home / Self Care | Payer: Self-pay | Source: Ambulatory Visit | Attending: Neurosurgery | Admitting: Neurosurgery

## 2023-08-18 DIAGNOSIS — Z049 Encounter for examination and observation for unspecified reason: Secondary | ICD-10-CM

## 2023-08-19 ENCOUNTER — Ambulatory Visit: Admission: RE | Admit: 2023-08-19 | Source: Home / Self Care

## 2023-08-19 ENCOUNTER — Ambulatory Visit
Admission: RE | Admit: 2023-08-19 | Discharge: 2023-08-19 | Disposition: A | Discharge status: Home / Self Care | Source: Ambulatory Visit | Attending: Neurosurgery | Admitting: Neurosurgery

## 2023-08-19 ENCOUNTER — Encounter: Payer: Self-pay | Admitting: Neurosurgery

## 2023-08-19 ENCOUNTER — Other Ambulatory Visit: Payer: Self-pay | Admitting: Neurosurgery

## 2023-08-19 ENCOUNTER — Ambulatory Visit: Admitting: Neurosurgery

## 2023-08-19 VITALS — BP 148/96 | Ht 63.0 in | Wt 185.0 lb

## 2023-08-19 DIAGNOSIS — M5412 Radiculopathy, cervical region: Secondary | ICD-10-CM | POA: Insufficient documentation

## 2023-08-19 DIAGNOSIS — M542 Cervicalgia: Secondary | ICD-10-CM | POA: Diagnosis not present

## 2023-08-19 DIAGNOSIS — M4802 Spinal stenosis, cervical region: Secondary | ICD-10-CM | POA: Diagnosis not present

## 2023-08-19 DIAGNOSIS — M4712 Other spondylosis with myelopathy, cervical region: Secondary | ICD-10-CM

## 2023-08-19 DIAGNOSIS — G9589 Other specified diseases of spinal cord: Secondary | ICD-10-CM

## 2023-08-19 DIAGNOSIS — G992 Myelopathy in diseases classified elsewhere: Secondary | ICD-10-CM

## 2023-08-19 DIAGNOSIS — R292 Abnormal reflex: Secondary | ICD-10-CM

## 2023-08-19 DIAGNOSIS — M47812 Spondylosis without myelopathy or radiculopathy, cervical region: Secondary | ICD-10-CM | POA: Diagnosis not present

## 2023-08-19 NOTE — Progress Notes (Signed)
 Referring Physician:  Sowles, Krichna, MD [address] [place],  [place] [address]  Primary Physician:  Sowles, Krichna, MD  History of Present Illness: 09/01/2023 [name] is here today with a chief complaint of cervical myeloradiculopathy.  She does have a history of chronic back pain as well as chronic neck pain.  She is here today mostly for her neck pain.  She feels pain in her neck that radiates from her neck all the way down to her right hand.  She is also had progressive weakness numbness and tingling in the right side.  She has also noticed that the right side has had numbness tingling and decreased sensation in the right lower body and some of the trunk as well.  She has been managed by our pain team and been continue to follow by our orthopedics team.  There was concern for cervical myeloradiculopathy so she was referred to neurosurgery.  She is here today for evaluation.  Conservative measures:  Physical therapy:  has not participated in PT Multimodal medical therapy including regular antiinflammatories:  Celebrex , Tylenol , Voltaren , Norco, Prednisone  Injections:  09/21/2019: Bilateral L5-S1 transforaminal ESI  08/19/2019: Bilateral L5-S1 transforaminal ESI  08/19/2011: Left hip joint injection 12/31/2010: Left C3-4 transforaminal ESI   The symptoms are causing a significant impact on the patient's life.   I have utilized the care everywhere function in epic to review the outside records available from external health systems.  Review of Systems:  A 10 point review of systems is negative, except for the pertinent positives and negatives detailed in the HPI.  Past Medical History: Past Medical History:  Diagnosis Date   Anxiety    Arthritis    Bradycardia    Bronchitis    Complication of anesthesia    woke up during hip surgery   Dysrhythmia    congenital bradycardia   GERD (gastroesophageal reflux disease)    History of positive PPD 2009    History of positive PPD 2009   INH through health dept CXR's needed for clearance   Hyperlipidemia    Hypertension    Insomnia    Lumbar spondylosis    Managed by Dr. Ozell Flake [facility]   Obesity    Rosacea    Spinal stenosis    Spondylosis of cervical spine     Past Surgical History: Past Surgical History:  Procedure Laterality Date   CERVICAL CONE BIOPSY     COLONOSCOPY  10/2010   COLONOSCOPY  2004   endoscopic carpal tunn Bilateral    HALLUX VALGUS CORRECTION Right 2005   HYSTERECTOMY ABDOMINAL WITH SALPINGECTOMY  1981   INCISION TENDON SHEATH HAND Right    JOINT REPLACEMENT Right 11/21/2010   knee   JOINT REPLACEMENT Left 11/04/2011   hip   JOINT REPLACEMENT Left 04/27/2012   knee   KNEE ARTHROSCOPY Right    KNEE ARTHROSCOPY Left 12/18/2011   SHOULDER ARTHROSCOPY WITH SUBACROMIAL DECOMPRESSION, ROTATOR CUFF REPAIR AND BICEP TENDON REPAIR Left 09/20/2020   Procedure: SHOULDER ARTHROSCOPY WITH DEBRIDEMENT, DECOMPRESSION, ROTATOR CUFF REPAIR AND BICEPS TENODESIS;  Surgeon: Edie Norleen PARAS, MD;  Location: [facility] ORS;  Service: Orthopedics;  Laterality: Left;    Allergies: Allergies as of 08/19/2023 - Review Complete 08/19/2023  Allergen Reaction Noted   Sulfa antibiotics Nausea Only 05/20/2016   Penicillins Rash 05/20/2016    Medications:  Current Outpatient Medications:    acetaminophen  (TYLENOL ) 500 MG tablet, Take 1 tablet (500 mg total) by mouth 2 (two)  times daily., Disp: 60 tablet, Rfl: 0   Ascorbic Acid (VITAMIN C) 1000 MG tablet, Take 1,000 mg by mouth daily., Disp: , Rfl:    atorvastatin  (LIPITOR) 40 MG tablet, Take 1 tablet (40 mg total) by mouth daily., Disp: 90 tablet, Rfl: 1   celecoxib  (CELEBREX ) 200 MG capsule, Take 1 capsule (200 mg total) by mouth daily., Disp: 90 capsule, Rfl: 0   diclofenac  Sodium (VOLTAREN ) 1 % GEL, Apply topically 4 (four) times daily. As needed, Disp: , Rfl:    estradiol  (ESTRACE ) 0.5 MG tablet, Take 1 tablet (0.5 mg total)  by mouth daily., Disp: 90 tablet, Rfl: 3   losartan  (COZAAR ) 50 MG tablet, Take 1 tablet (50 mg total) by mouth daily., Disp: 90 tablet, Rfl: 1   Magnesium 250 MG TABS, Take 250 mg by mouth at bedtime., Disp: , Rfl:    Multiple Vitamin (MULTIVITAMIN) tablet, Take 1 tablet by mouth daily., Disp: , Rfl:    Vitamin D , Cholecalciferol, 50 MCG (2000 UT) CAPS, Take 2,000 Units by mouth daily., Disp: , Rfl:   Social History: Social History   Tobacco Use   Smoking status: Never   Smokeless tobacco: Never  Vaping Use   Vaping status: Never Used  Substance Use Topics   Alcohol use: Yes    Alcohol/week: 1.0 standard drink of alcohol    Types: 1 Glasses of wine per week   Drug use: No    Family Medical History: Family History  Problem Relation Age of Onset   Alzheimer's disease Mother 65   Osteoporosis Mother    Stroke Father 74   Arthritis Sister    Basal cell carcinoma Sister    Breast cancer Maternal Aunt 66   Macular degeneration Other     Physical Examination: Vitals:   08/19/23 1316  BP: (!) 148/96    General: Patient is in no apparent distress. Attention to examination is appropriate.  Neck:   Supple.  Full range of motion.  Respiratory: Patient is breathing without any difficulty.   NEUROLOGICAL:     Awake, alert, oriented to person, place, and time.  Speech is clear and fluent.   Cranial Nerves: Pupils equal round and reactive to light.  Facial tone is symmetric.  Facial sensation is symmetric. Shoulder shrug is symmetric. Tongue protrusion is midline.  On cervical extension she does demonstrate some Spurling type sign with radiation down her right shoulder into her hand. Strength: Side Biceps Triceps Deltoid Interossei Grip Wrist Ext. Wrist Flex.  R 4+ 5 4+ 4 4 4  4+  L 5 5 5 5 5 5 5    Side Iliopsoas Quads Hamstring PF DF EHL  R 4 5 5 5  4+ 3  L 5 5 5 5 5 5    On the left side patient is 1-2 out of 3 in her reflexes, on the right side she does show  hyperreflexia with spreading reflexes in the right upper extremity.  There is spreading reflex at the brachial radialis.  She has Tromner and Hoffman's on the right.  This not present on the left.  She has a crossed adductor sign on the right, this is not present on the left.  She has difficulty relaxing for clonus testing.  Toes are equivocal.  She has a slightly wide-based gait and is some slight unsteadiness on her feet while ambulating.  She states that this has been present but slowly getting worse over the past few years.  She feels like her symptoms overall have been  getting much worse over the past few months.  Imaging: Narrative & Impression  CLINICAL DATA:  78 year old female with neck pain radiating to the shoulder and arm for 2 weeks.   EXAM: MRI CERVICAL SPINE WITHOUT CONTRAST   TECHNIQUE: Multiplanar, multisequence MR imaging of the cervical spine was performed. No intravenous contrast was administered.   COMPARISON:  Cervical spine MRI 07/05/2020.   FINDINGS: Alignment: Chronic straightening of cervical lordosis with chronic upper thoracic degenerative appearing anterolisthesis, similar anterolisthesis now at C7-T1 (2-3 mm) which is new since 2022. Mild underlying cervical spine scoliosis.   Vertebrae: Bulky chronic degenerative endplate spurring and chronic marrow signal changes superimposed on normal background bone marrow signal. No convincing cervical spine marrow edema or acute osseous abnormality. Faint degenerative endplate marrow edema in the upper thoracic spine at T1-T2.   Cord: Widespread degenerative spinal cord mass effect, severe now at C4-C5 (series 8, image 19). And abnormal right hemi cord signal just caudal to that level suspicious for myelomalacia (series 8, image 22). And this was present in 2022.   Multilevel less pronounced degenerative spinal cord mass effect elsewhere. See details below.   Posterior Fossa, vertebral arteries, paraspinal  tissues: Cervicomedullary junction is within normal limits. Negative visible posterior fossa. Preserved major vascular flow voids in the bilateral neck. Tortuous carotid arteries. Negative visible lung apices.   Disc levels:   C2-C3: Mild to moderate facet and ligament flavum hypertrophy greater on the left. Mild left C3 foraminal stenosis.   C3-C4: Chronic severe disc and endplate degeneration. Chronic severe disc space loss and bulky circumferential disc osteophyte complex asymmetric to the left. Moderate to severe left facet hypertrophy. Mild facet and ligament flavum hypertrophy otherwise. Mild to moderate spinal stenosis, mild spinal cord mass effect (series 8, image 16). Severe left and moderate right C4 foraminal stenosis.   C4-C5: Severe chronic, bulky disc and endplate degeneration with circumferential disc osteophyte complex and moderate to severe facet and ligament flavum hypertrophy. Chronic severe spinal stenosis and spinal cord mass effect (series 8, image 19). Severe left and moderate right C5 foraminal stenosis.   C5-C6: Similar advanced disc and endplate degeneration with bulky disc osteophyte complex and mild to moderate posterior element hypertrophy. Moderate spinal stenosis and spinal cord mass effect at this level (series 8, image 23). Right hemicord myelomalacia is chronic. Moderate right greater than left C6 foraminal stenosis.   C6-C7: Chronic severe disc space loss at this level with less pronounced endplate spurring. No obvious ankylosis. Moderate spinal stenosis with mild spinal cord mass effect on series 8, image 28. No convincing foraminal stenosis.   C7-T1: New anterolisthesis. Bulky disc osteophyte complex and facet and ligament flavum hypertrophy. Moderate spinal stenosis with mild spinal cord mass effect (series 8, image 32). Severe left greater than right C8 foraminal stenosis. This level has substantially progressed since 2022.   Upper  thoracic spine degeneration, spondylolisthesis. Only mild visible upper thoracic spinal stenosis.   IMPRESSION: 1. Severe chronic cervical spine degeneration. Bulky endplate and posterior element degeneration throughout. Widespread cervical spinal stenosis and spinal cord mass effect: - Severe at C4-C5. - Moderate at C5-C6, with chronic right hemi-cord Myelomalacia at this level. - up to moderate at C3-C4 with mild spinal cord mass effect. Severe left C4 foraminal stenosis. - Moderate at C6-C7 with mild spinal cord mass effect.   2. Substantially progressed C7-T1 degeneration since 2022, new spondylolisthesis and new moderate spinal stenosis with mild cord mass effect. Progressed and severe left greater than right C8 foraminal  stenosis.     Electronically Signed   By: VEAR Hurst M.D.   On: 08/14/2023 09:14      I have personally reviewed the images and agree with the above interpretation.  Medical Decision Making/Assessment and Plan: Ms. 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  She is here today for evaluation.  She has a history of chronic back pain as well as neck issues.  She has been treated for her back pain hip issues and bilateral knee issues.  She has been conservatively managed for her chronic neck pain.  She is being seen today for signs and symptoms consistent with cervical spondylitic myelopathy.  On physical examination she is hyperreflexic on the right with decree sensation, she also has evidence of spinal cord type presentation of weakness with weakness in her shoulders and hands predominantly as well as in her hip and dorsiflexors.  She does have other levels of weakness but her most significant is in this distribution.  She is also pathologic reflexes with a positive Tromner/Hoffmann's on the right as well as crossed adductors.  Her imaging has been reviewed and shows cervical spondylitic myelopathy and compression of the cervical spinal  cord with myelomalacia most severe from C4-C6, there is some mild compression at the C6-7 level without any evidence of myelomalacia.  I would like to get flexion-extension x-rays to evaluate her.  This will help us  know whether or not she has any adjacent level concerns for instability.  I do feel like she meets indications for C4-C6 anterior cervical discectomy and fusion, this will address her most serious levels of compression.  I did discuss with her that risks and benefits include but are not limited to weakness numbness tingling graft failure, need for further surgery, need for further fusions.  I did discuss also with her that given her level of stenosis that some patients will require posterior surgery as well.  Would like to minimize the amount of surgery that she has and would like to go for the levels of the most significant compression and myelomalacia.  For this we perform a C4-C6 anterior cervical discectomy and fusion.  This may change with the updated x-rays, we will plan to follow-up with her after the x-rays are formally read so we can make a final decision on surgery.  Thank you for involving me in the care of this patient.    [name] MD/MSCR Neurosurgery

## 2023-08-20 DIAGNOSIS — M48062 Spinal stenosis, lumbar region with neurogenic claudication: Secondary | ICD-10-CM | POA: Diagnosis not present

## 2023-08-20 DIAGNOSIS — M5416 Radiculopathy, lumbar region: Secondary | ICD-10-CM | POA: Diagnosis not present

## 2023-08-21 ENCOUNTER — Other Ambulatory Visit: Payer: Self-pay | Admitting: Family Medicine

## 2023-08-21 DIAGNOSIS — M5416 Radiculopathy, lumbar region: Secondary | ICD-10-CM

## 2023-08-24 ENCOUNTER — Telehealth: Payer: Self-pay | Admitting: Neurosurgery

## 2023-08-24 DIAGNOSIS — M5412 Radiculopathy, cervical region: Secondary | ICD-10-CM

## 2023-08-24 NOTE — Telephone Encounter (Signed)
 Patient is calling to try and schedule her surgery. She states she saw Dr. Claudene on 08/19/2023 and she had the x-rays done that he requested. She is leaving for [place] on Tuesday and would like to schedule surgery before she leaves.

## 2023-08-24 NOTE — Telephone Encounter (Signed)
*  Kari Lee said she is leaving for [place] NEXT Tuesday (not tomorrow).

## 2023-08-27 NOTE — Discharge Instructions (Signed)

## 2023-08-28 ENCOUNTER — Other Ambulatory Visit: Payer: Self-pay | Admitting: Family Medicine

## 2023-08-28 ENCOUNTER — Ambulatory Visit
Admission: RE | Admit: 2023-08-28 | Discharge: 2023-08-28 | Disposition: A | Discharge status: Home / Self Care | Source: Ambulatory Visit | Attending: Family Medicine | Admitting: Family Medicine

## 2023-08-28 DIAGNOSIS — M4727 Other spondylosis with radiculopathy, lumbosacral region: Secondary | ICD-10-CM | POA: Diagnosis not present

## 2023-08-28 DIAGNOSIS — M5416 Radiculopathy, lumbar region: Secondary | ICD-10-CM

## 2023-08-28 MED ORDER — METHYLPREDNISOLONE ACETATE 40 MG/ML INJ SUSP (RADIOLOG
80.0000 mg | Freq: Once | INTRAMUSCULAR | Status: AC
Start: 2023-08-28 — End: 2023-08-28
  Administered 2023-08-28: 80 mg via EPIDURAL

## 2023-08-28 MED ORDER — IOPAMIDOL (ISOVUE-M 200) INJECTION 41%
1.0000 mL | Freq: Once | INTRAMUSCULAR | Status: AC
Start: 2023-08-28 — End: 2023-08-28
  Administered 2023-08-28: 1 mL via EPIDURAL

## 2023-08-31 NOTE — Telephone Encounter (Signed)
 Patient is calling back to see about getting her surgery scheduled before she leaves for [place] tomorrow afternoon. She also states that she cannot see her visit notes from when she saw Dr. Claudene. Please advise.

## 2023-09-01 NOTE — Addendum Note (Signed)
 Addended by: Lennix Rotundo on: 09/01/2023 06:46 PM   Modules accepted: Orders

## 2023-09-01 NOTE — Progress Notes (Signed)
 Addendum note.  X-rays have come back.  They demonstrate no evidence of frank instability.  Given these findings I would recommend a C4-C6 anterior cervical discectomy and fusion.  This will help with direct decompression of her cervical spinal cord at the areas of the most severe compression.  She does not have areas of mobile spondylolisthesis at the level above or below, given that these are more mild in nature rather than extending the fusion construct would rather address the most serious problems.

## 2023-09-01 NOTE — Telephone Encounter (Signed)
 Kari Lee spoke with the patient. She will be in [place] until 09/17/23. She is scheduled for a telephone visit on 09/21/23 with Dr Claudene to discuss surgery details. She chose 10/15/23 for her surgery date. Per Dr Claudene, she will be a C4-6 ACDF.

## 2023-09-01 NOTE — Telephone Encounter (Signed)
 Dr Claudene, does everything below look correct?  CPT codes: 77448, 22552, 20931, 22845  Diagnosis: cervical radiculopathy  Globus forge  No monitoring  Indication for consent: cervical radiculopathy  No brace   23 hour observation

## 2023-09-04 NOTE — Telephone Encounter (Signed)
 Planned surgery: C4-6 anterior cervical discectomy and fusion   Surgery date: 10/15/2023 at [facility]: [address], [place], [place] [address]) - you will find out your arrival time the business day before your surgery.   Pre-op appointment at [facility] Pre-admit Testing: you will receive a call with a date/time for this appointment. If you are scheduled for an in person appointment, Pre-admit Testing is located on the first floor of the [facility] building, [address], [address]. During this appointment, they will advise you which medications you can take the morning of surgery, and which medications you will need to hold for surgery. Labs (such as blood work, EKG) may be done at your pre-op appointment. You are not required to fast for these labs. Should you need to change your pre-op appointment, please call Pre-admit testing at [phone].     NSAIDS (Non-steroidal anti-inflammatory drugs): because you are having a fusion, please avoid taking any NSAIDS (examples: ibuprofen, motrin, aleve, naproxen, meloxicam, diclofenac ) for 3 months after surgery. Celebrex  is an exception and is OK to take, if prescribed. Tylenol  is not an NSAID.    Common restrictions after spine surgery: No bending, lifting, or twisting ("BLT"). Avoid lifting objects heavier than 10 pounds for the first 6 weeks after surgery. Where possible, avoid household activities that involve lifting, bending, reaching, pushing, or pulling such as laundry, vacuuming, grocery shopping, and childcare. Try to arrange for help from friends and family for these activities while you heal. Do not drive while taking prescription pain medication. Weeks 6 through 12 after surgery: avoid lifting more than 25 pounds.    X-rays after surgery: Because you are having a fusion: for appointments after your 2 week follow-up: please arrive at the [facility] outpatient imaging center ([address], [address], [place]) or [employer] [facility] one hour prior to your appointment for x-rays. This applies to every appointment after your 2 week follow-up. Failure to do so may result in your appointment being rescheduled.   How to contact us :  If you have any questions/concerns before or after surgery, you can reach us  at [phone], or you can send a mychart message. We can be reached by phone or mychart 8am-4pm, Monday-Friday.  *Please note: Calls after 4pm are forwarded to a third party answering service. Mychart messages are not routinely monitored during evenings, weekends, and holidays. Please call our office to contact the answering service for urgent concerns during non-business hours.   If you have FMLA/disability paperwork, please drop it off or fax it to [phone]   Appointments/FMLA & disability paperwork: [employer] Registered Nurse/Surgery scheduler: Mckenzey Parcell, RN Certified Medical Assistants: Don, CMA, Elenor, CMA, & Damien, CMA Physician Assistants: Lyle Decamp, PA-C, Edsel Goods, PA-C & Glade Boys, PA-C Surgeons: Penne Sharps, MD & Reeves Daisy, MD   [facility] PREADMIT TESTING VISIT and SURGERY INFORMATION SHEET   Now that surgery has been scheduled you can anticipate several phone calls from [facility] services. A pharmacy technician will call you to verify your current list of medications taken at home.               The Pre-Service Center will call to verify your insurance information and to give you billing estimates and information.             The Preadmit Testing Office will be calling to schedule a visit to obtain information for the anesthesia team and  provide instructions on preparation for surgery.  What can you expect for the Preadmit Testing Visit: Appointments may be scheduled in-person or by telephone.  If a telephone visit is scheduled, you may be asked to come into the  office to have lab tests or other studies performed.   This visit will not be completed any greater than 14 days prior to your surgery.  If your surgery has been scheduled for a future date, please do not be alarmed if we have not contacted you to schedule an appointment more than a month prior to the surgery date.    Please be prepared to provide the following information during this appointment:            -Personal medical history                                               -Medication and allergy list            -Any history of problems with anesthesia              -Recent lab work or diagnostic studies            -Please notify us  of any needs we should be aware of to provide the best care possible           -You will be provided with instructions on how to prepare for your surgery.    On The Day of Surgery:  You must have a driver to take you home after surgery, you will be asked not to drive for 24 hours following surgery.  Taxi, Gisele and non-medical transport will not be acceptable means of transportation unless you have a responsible individual who will be traveling with you.  Visitors in the surgical area:   2 people will be able to visit you in your room once your preparation for surgery has been completed. During surgery, your visitors will be asked to wait in the Surgery Waiting Area.  It is not a requirement for them to stay, if they prefer to leave and come back.  Your visitor(s) will be given an update once the surgery has been completed.  No visitors are allowed in the initial recovery room to respect patient privacy and safety.  Once you are more awake and transfer to the secondary recovery area, or are transferred to an inpatient room, visitors will again be able to see you.  To respect and protect your privacy: We will ask on the day of surgery who your driver will be and what the contact number for that individual will be. We will ask if it is okay to share information  with this individual, or if there is an alternative individual that we, or the surgeon, should contact to provide updates and information. If family or friends come to the surgical information desk requesting information about you, who you have not listed with us , no information will be given.   It may be helpful to designate someone as the main contact who will be responsible for updating your other friends and family.    PREADMIT TESTING OFFICE: [phone] SAME DAY SURGERY: [phone] We look forward to caring for you before and throughout the process of your surgery.

## 2023-09-05 ENCOUNTER — Encounter: Payer: Self-pay | Admitting: Neurosurgery

## 2023-09-05 DIAGNOSIS — M4802 Spinal stenosis, cervical region: Secondary | ICD-10-CM | POA: Insufficient documentation

## 2023-09-05 DIAGNOSIS — G992 Myelopathy in diseases classified elsewhere: Secondary | ICD-10-CM | POA: Insufficient documentation

## 2023-09-05 DIAGNOSIS — M5412 Radiculopathy, cervical region: Secondary | ICD-10-CM | POA: Insufficient documentation

## 2023-09-07 ENCOUNTER — Other Ambulatory Visit: Payer: Self-pay

## 2023-09-07 DIAGNOSIS — M5412 Radiculopathy, cervical region: Secondary | ICD-10-CM

## 2023-09-07 DIAGNOSIS — Z01818 Encounter for other preprocedural examination: Secondary | ICD-10-CM

## 2023-09-07 DIAGNOSIS — G992 Myelopathy in diseases classified elsewhere: Secondary | ICD-10-CM

## 2023-09-21 ENCOUNTER — Ambulatory Visit: Admitting: Neurosurgery

## 2023-09-21 DIAGNOSIS — G959 Disease of spinal cord, unspecified: Secondary | ICD-10-CM

## 2023-09-21 DIAGNOSIS — G992 Myelopathy in diseases classified elsewhere: Secondary | ICD-10-CM

## 2023-09-21 DIAGNOSIS — M5412 Radiculopathy, cervical region: Secondary | ICD-10-CM | POA: Diagnosis not present

## 2023-09-21 NOTE — Progress Notes (Signed)
 I had a follow-up phone visit with Kari Lee today.  She was at home and I was in the office.  She gave consent to go forward with a phone visit.  We discussed her cervical myeloradiculopathy.  We discussed indications for surgery including decompression of her cervical spine at C4 and C5, we discussed that she should undergo a cervical decompression and fusion given the ongoing compression of her cervical spine as well as worsening myeloradiculopathy symptoms.  We discussed that she does have disease above and below these levels, however more extensive anterior cervical surgery would put her at high risk for swallowing difficulties and that these 2 levels are the most severe at this time and likely to be the most symptomatic.  We discussed risks and benefits of surgery including but not limited to nerve injury, spinal cord injury, CSF leak, C5 palsy, failure to fuse, need for further surgery.  Given her progressive symptomatology she like to go forward with surgery.  She is currently scheduled for C4-C6 anterior cervical discectomy and fusion.  Spent a total of 10 minutes over the phone with her today.  [name], MD

## 2023-09-28 ENCOUNTER — Other Ambulatory Visit: Payer: Self-pay | Admitting: Family Medicine

## 2023-09-28 DIAGNOSIS — G959 Disease of spinal cord, unspecified: Secondary | ICD-10-CM

## 2023-09-28 DIAGNOSIS — M19041 Primary osteoarthritis, right hand: Secondary | ICD-10-CM

## 2023-09-29 ENCOUNTER — Other Ambulatory Visit: Payer: Self-pay

## 2023-09-29 DIAGNOSIS — G959 Disease of spinal cord, unspecified: Secondary | ICD-10-CM

## 2023-09-29 DIAGNOSIS — M5416 Radiculopathy, lumbar region: Secondary | ICD-10-CM | POA: Diagnosis not present

## 2023-09-29 DIAGNOSIS — M48062 Spinal stenosis, lumbar region with neurogenic claudication: Secondary | ICD-10-CM | POA: Diagnosis not present

## 2023-09-29 DIAGNOSIS — M19041 Primary osteoarthritis, right hand: Secondary | ICD-10-CM

## 2023-09-29 MED ORDER — CELECOXIB 100 MG PO CAPS: 100.0000 mg | ORAL_CAPSULE | Freq: Two times a day (BID) | ORAL | 0 refills | Status: DC

## 2023-09-29 NOTE — Telephone Encounter (Signed)
 Unable to sign off, prescription pended

## 2023-09-29 NOTE — Telephone Encounter (Signed)
 Copied from CRM #[id number]. Topic: Clinical - Prescription Issue >> Sep 29, 2023  1:37 PM Sophia H wrote: Reason for CRM: Patient is requesting an updated prescription for celecoxib  (CELEBREX ) 200 MG capsule. Patient states she spoke with the provider about changing it to 100 MG twice a day instead of 200 MG once a day. Needing new rx sent to pharmacy because they are trying to fill the original script. Please advise, Ty. # [phone]  Advised 3 business day turnaround.   [id number] - [place], [place] - [address]

## 2023-09-29 NOTE — Addendum Note (Signed)
 Addended by: RENTERIA-GARCIA, Moriya Mitchell on: 09/29/2023 04:10 PM   Modules accepted: Orders

## 2023-10-07 NOTE — Patient Instructions (Addendum)
 Your procedure is scheduled on:10-15-23 Thursday Report to the Registration Desk on the 1st floor of the [facility].Then proceed to the 2nd floor Surgery Desk To find out your arrival time, please call [phone] between 1PM - 3PM on:10-14-23 Wednesday If your arrival time is 6:00 am, do not arrive before that time as the [facility] entrance doors do not open until 6:00 am.  REMEMBER: Instructions that are not followed completely may result in serious medical risk, up to and including death; or upon the discretion of your surgeon and anesthesiologist your surgery may need to be rescheduled.  Do not eat food after midnight the night before surgery.  No gum chewing or hard candies.  You may however, drink CLEAR liquids up to 2 hours before you are scheduled to arrive for your surgery. Do not drink anything within 2 hours of your scheduled arrival time.  Clear liquids include: - water  - apple juice without pulp - gatorade (not RED colors) - black coffee or tea (Do NOT add milk or creamers to the coffee or tea) Do NOT drink anything that is not on this list.  One week prior to surgery: Stop ANY OVER THE COUNTER supplements until after surgery (Vitamin C, Magnesium Glycinate, Multivitamin, Vitamin D3)  Continue taking all of your other prescription medications up until the day of surgery.  Do NOT take any medication the day of surgery  No Alcohol for 24 hours before or after surgery.  No Smoking including e-cigarettes for 24 hours before surgery.  No chewable tobacco products for at least 6 hours before surgery.  No nicotine patches on the day of surgery.  Do not use any recreational drugs for at least a week (preferably 2 weeks) before your surgery.  Please be advised that the combination of cocaine and anesthesia may have negative outcomes, up to and including death. If you test positive for cocaine, your surgery will be cancelled.  On the morning of surgery brush your  teeth with toothpaste and water, you may rinse your mouth with mouthwash if you wish. Do not swallow any toothpaste or mouthwash.  Use CHG Soap as directed on instruction sheet.  Do not wear jewelry, make-up, hairpins, clips or nail polish.  For welded (permanent) jewelry: bracelets, anklets, waist bands, etc.  Please have this removed prior to surgery.  If it is not removed, there is a chance that hospital personnel will need to cut it off on the day of surgery.  Do not wear lotions, powders, or perfumes.   Do not shave body hair from the neck down 48 hours before surgery.  Contact lenses, hearing aids and dentures may not be worn into surgery.  Do not bring valuables to the hospital. [facility] is not responsible for any missing/lost belongings or valuables.    Notify your doctor if there is any change in your medical condition (cold, fever, infection).  Wear comfortable clothing (specific to your surgery type) to the hospital.  After surgery, you can help prevent lung complications by doing breathing exercises.  Take deep breaths and cough every 1-2 hours. Your doctor may order a device called an Incentive Spirometer to help you take deep breaths. When coughing or sneezing, hold a pillow firmly against your incision with both hands. This is called "splinting." Doing this helps protect your incision. It also decreases belly discomfort.  If you are being admitted to the hospital overnight, leave your suitcase in the car. After surgery it may be brought to  your room.  In case of increased patient census, it may be necessary for you, the patient, to continue your postoperative care in the Same Day Surgery department.  If you are being discharged the day of surgery, you will not be allowed to drive home. You will need a responsible individual to drive you home and stay with you for 24 hours after surgery.   If you are taking public transportation, you will need to have a responsible  individual with you.  Please call the Pre-admissions Testing Dept. at [phone] if you have any questions about these instructions.  Surgery Visitation Policy:  Patients having surgery or a procedure may have two visitors.  Children under the age of 80 must have an adult with them who is not the patient.  Inpatient Visitation:    Visiting hours are 7 a.m. to 8 p.m. Up to four visitors are allowed at one time in a patient room. The visitors may rotate out with other people during the day.  One visitor age 6 or older may stay with the patient overnight and must be in the room by 8 p.m.    Pre-operative 5 CHG Bath Instructions   You can play a key role in reducing the risk of infection after surgery. Your skin needs to be as free of germs as possible. You can reduce the number of germs on your skin by washing with CHG (chlorhexidine  gluconate) soap before surgery. CHG is an antiseptic soap that kills germs and continues to kill germs even after washing.   DO NOT use if you have an allergy to chlorhexidine /CHG or antibacterial soaps. If your skin becomes reddened or irritated, stop using the CHG and notify one of our RNs at [phone].   Please shower with the CHG soap starting 4 days before surgery using the following schedule:     Please keep in mind the following:  DO NOT shave, including legs and underarms, starting the day of your first shower.   You may shave your face at any point before/day of surgery.  Place clean sheets on your bed the day you start using CHG soap. Use a clean washcloth (not used since being washed) for each shower. DO NOT sleep with pets once you start using the CHG.   CHG Shower Instructions:  If you choose to wash your hair and private area, wash first with your normal shampoo/soap.  After you use shampoo/soap, rinse your hair and body thoroughly to remove shampoo/soap residue.  Turn the water OFF and apply about 3 tablespoons (45 ml) of CHG soap  to a CLEAN washcloth.  Apply CHG soap ONLY FROM YOUR NECK DOWN TO YOUR TOES (washing for 3-5 minutes)  DO NOT use CHG soap on face, private areas, open wounds, or sores.  Pay special attention to the area where your surgery is being performed.  If you are having back surgery, having someone wash your back for you may be helpful. Wait 2 minutes after CHG soap is applied, then you may rinse off the CHG soap.  Pat dry with a clean towel  Put on clean clothes/pajamas   If you choose to wear lotion, please use ONLY the CHG-compatible lotions on the back of this paper.     Additional instructions for the day of surgery: DO NOT APPLY any lotions, deodorants, cologne, or perfumes.   Put on clean/comfortable clothes.  Brush your teeth.  Ask your nurse before applying any prescription medications to the skin.  CHG Compatible Lotions   Aveeno Moisturizing lotion  Cetaphil Moisturizing Cream  Cetaphil Moisturizing Lotion  Clairol Herbal Essence Moisturizing Lotion, Dry Skin  Clairol Herbal Essence Moisturizing Lotion, Extra Dry Skin  Clairol Herbal Essence Moisturizing Lotion, Normal Skin  Curel Age Defying Therapeutic Moisturizing Lotion with Alpha Hydroxy  Curel Extreme Care Body Lotion  Curel Soothing Hands Moisturizing Hand Lotion  Curel Therapeutic Moisturizing Cream, Fragrance-Free  Curel Therapeutic Moisturizing Lotion, Fragrance-Free  Curel Therapeutic Moisturizing Lotion, Original Formula  Eucerin Daily Replenishing Lotion  Eucerin Dry Skin Therapy Plus Alpha Hydroxy Crme  Eucerin Dry Skin Therapy Plus Alpha Hydroxy Lotion  Eucerin Original Crme  Eucerin Original Lotion  Eucerin Plus Crme Eucerin Plus Lotion  Eucerin TriLipid Replenishing Lotion  Keri Anti-Bacterial Hand Lotion  Keri Deep Conditioning Original Lotion Dry Skin Formula Softly Scented  Keri Deep Conditioning Original Lotion, Fragrance Free Sensitive Skin Formula  Keri Lotion Fast Absorbing Fragrance Free  Sensitive Skin Formula  Keri Lotion Fast Absorbing Softly Scented Dry Skin Formula  Keri Original Lotion  Keri Skin Renewal Lotion Keri Silky Smooth Lotion  Keri Silky Smooth Sensitive Skin Lotion  Nivea Body Creamy Conditioning Oil  Nivea Body Extra Enriched Teacher, adult education Moisturizing Lotion Nivea Crme  Nivea Skin Firming Lotion  NutraDerm 30 Skin Lotion  NutraDerm Skin Lotion  NutraDerm Therapeutic Skin Cream  NutraDerm Therapeutic Skin Lotion  ProShield Protective Hand Cream  Provon moisturizing lotion   Merchandiser, retail to address health-related social needs:  [email or web address]

## 2023-10-08 ENCOUNTER — Encounter
Admission: RE | Admit: 2023-10-08 | Discharge: 2023-10-08 | Disposition: A | Discharge status: Home / Self Care | Source: Ambulatory Visit | Attending: Neurosurgery | Admitting: Neurosurgery

## 2023-10-08 ENCOUNTER — Other Ambulatory Visit: Payer: Self-pay

## 2023-10-08 ENCOUNTER — Inpatient Hospital Stay: Admission: RE | Admit: 2023-10-08 | Source: Ambulatory Visit

## 2023-10-08 VITALS — BP 164/82 | HR 57 | Resp 14 | Wt 190.4 lb

## 2023-10-08 DIAGNOSIS — I10 Essential (primary) hypertension: Secondary | ICD-10-CM | POA: Insufficient documentation

## 2023-10-08 DIAGNOSIS — G959 Disease of spinal cord, unspecified: Secondary | ICD-10-CM

## 2023-10-08 DIAGNOSIS — Z01818 Encounter for other preprocedural examination: Secondary | ICD-10-CM | POA: Diagnosis present

## 2023-10-08 DIAGNOSIS — Z01812 Encounter for preprocedural laboratory examination: Secondary | ICD-10-CM

## 2023-10-08 DIAGNOSIS — Z01810 Encounter for preprocedural cardiovascular examination: Secondary | ICD-10-CM

## 2023-10-08 HISTORY — DX: Disease of spinal cord, unspecified: G95.9

## 2023-10-08 HISTORY — DX: Other intervertebral disc degeneration, lumbar region without mention of lumbar back pain or lower extremity pain: M51.369

## 2023-10-08 HISTORY — DX: Vitamin D deficiency, unspecified: E55.9

## 2023-10-08 HISTORY — DX: Radiculopathy, cervical region: M54.12

## 2023-10-08 LAB — BASIC METABOLIC PANEL WITH GFR
Anion gap: 8 (ref 5–15)
BUN: 19 mg/dL (ref 8–23)
CO2: 26 mmol/L (ref 22–32)
Calcium: 9.4 mg/dL (ref 8.9–10.3)
Chloride: 106 mmol/L (ref 98–111)
Creatinine, Ser: 0.61 mg/dL (ref 0.44–1.00)
GFR, Estimated: >60 mL/min (ref >60)
Glucose, Bld: 82 mg/dL (ref 70–99)
Potassium: 4.2 mmol/L (ref 3.5–5.1)
Sodium: 140 mmol/L (ref 135–145)

## 2023-10-08 LAB — CBC
HCT: 46.3 % — ABNORMAL HIGH (ref 36.0–46.0)
Hemoglobin: 15.2 g/dL — ABNORMAL HIGH (ref 12.0–15.0)
MCH: 30.8 pg (ref 26.0–34.0)
MCHC: 32.8 g/dL (ref 30.0–36.0)
MCV: 93.7 fL (ref 80.0–100.0)
Platelets: 171 K/uL (ref 150–400)
RBC: 4.94 MIL/uL (ref 3.87–5.11)
RDW: 14.4 % (ref 11.5–15.5)
WBC: 5.8 K/uL (ref 4.0–10.5)
nRBC: 0 % (ref 0.0–0.2)

## 2023-10-08 LAB — SURGICAL PCR SCREEN
MRSA, PCR: NEGATIVE
Staphylococcus aureus: NEGATIVE

## 2023-10-08 LAB — TYPE AND SCREEN
ABO/RH(D): O POS
Antibody Screen: NEGATIVE

## 2023-10-08 NOTE — Patient Instructions (Signed)
 How to Use an Incentive Spirometer An incentive spirometer is a tool that measures how well you are filling your lungs with each breath. Learning to take long, deep breaths using this tool can help you keep your lungs clear and active. This may help to reverse or lessen your chance of developing breathing (pulmonary) problems, especially infection. You may be asked to use a spirometer: After a surgery. If you have a lung problem or a history of smoking. After a long period of time when you have been unable to move or be active. If the spirometer includes an indicator to show the highest number that you have reached, your health care provider or respiratory therapist will help you set a goal. Keep a log of your progress as told by your health care provider. What are the risks? Breathing too quickly may cause dizziness or cause you to pass out. Take your time so you do not get dizzy or light-headed. If you are in pain, you may need to take pain medicine before doing incentive spirometry. It is harder to take a deep breath if you are having pain. How to use your incentive spirometer  Sit up on the edge of your bed or on a chair. Hold the incentive spirometer so that it is in an upright position. Before you use the spirometer, breathe out normally. Place the mouthpiece in your mouth. Make sure your lips are closed tightly around it. Breathe in slowly and as deeply as you can through your mouth, causing the piston or the ball to rise toward the top of the chamber. Hold your breath for 3-5 seconds, or for as long as possible. If the spirometer includes a coach indicator, use this to guide you in breathing. Slow down your breathing if the indicator goes above the marked areas. Remove the mouthpiece from your mouth and breathe out normally. The piston or ball will return to the bottom of the chamber. Rest for a few seconds, then repeat the steps 10 or more times. Take your time and take a few normal  breaths between deep breaths so that you do not get dizzy or light-headed. Do this every 1-2 hours when you are awake. If the spirometer includes a goal marker to show the highest number you have reached (best effort), use this as a goal to work toward during each repetition. After each set of 10 deep breaths, cough a few times. This will help to make sure that your lungs are clear. If you have an incision on your chest or abdomen from surgery, place a pillow or a rolled-up towel firmly against the incision when you cough. This can help to reduce pain while taking deep breaths and coughing. General tips When you are able to get out of bed: Walk around often. Continue to take deep breaths and cough in order to clear your lungs. Keep using the incentive spirometer until your health care provider says it is okay to stop using it. If you have been in the hospital, you may be told to keep using the spirometer at home. Contact a health care provider if: You are having difficulty using the spirometer. You have trouble using the spirometer as often as instructed. Your pain medicine is not giving enough relief for you to use the spirometer as told. You have a fever. Get help right away if: You develop shortness of breath. You develop a cough with bloody mucus from the lungs. You have fluid or blood coming from an incision  site after you cough. Summary An incentive spirometer is a tool that can help you learn to take long, deep breaths to keep your lungs clear and active. You may be asked to use a spirometer after a surgery, if you have a lung problem or a history of smoking, or if you have been inactive for a long period of time. Use your incentive spirometer as instructed every 1-2 hours while you are awake. If you have an incision on your chest or abdomen, place a pillow or a rolled-up towel firmly against your incision when you cough. This will help to reduce pain. Get help right away if you have  shortness of breath, you cough up bloody mucus, or blood comes from your incision when you cough. This information is not intended to replace advice given to you by your health care provider. Make sure you discuss any questions you have with your health care provider. Document Revised: 11/21/2022 Document Reviewed: 11/21/2022 Elsevier Patient Education  2024 [employer].

## 2023-10-15 ENCOUNTER — Encounter: Payer: Self-pay | Admitting: Neurosurgery

## 2023-10-15 ENCOUNTER — Ambulatory Visit
Admission: RE | Admit: 2023-10-15 | Discharge: 2023-10-16 | Disposition: A | Discharge status: Home Health | Attending: Neurosurgery | Admitting: Neurosurgery

## 2023-10-15 ENCOUNTER — Encounter
Admission: RE | Disposition: A | Discharge status: Home Health | Payer: Self-pay | Source: Home / Self Care | Attending: Neurosurgery

## 2023-10-15 ENCOUNTER — Ambulatory Visit

## 2023-10-15 ENCOUNTER — Other Ambulatory Visit: Payer: Self-pay

## 2023-10-15 ENCOUNTER — Ambulatory Visit: Payer: Self-pay | Admitting: Urgent Care

## 2023-10-15 ENCOUNTER — Ambulatory Visit: Admitting: Registered Nurse

## 2023-10-15 DIAGNOSIS — M4322 Fusion of spine, cervical region: Secondary | ICD-10-CM | POA: Diagnosis not present

## 2023-10-15 DIAGNOSIS — M4722 Other spondylosis with radiculopathy, cervical region: Secondary | ICD-10-CM | POA: Insufficient documentation

## 2023-10-15 DIAGNOSIS — M5412 Radiculopathy, cervical region: Secondary | ICD-10-CM | POA: Diagnosis not present

## 2023-10-15 DIAGNOSIS — I10 Essential (primary) hypertension: Secondary | ICD-10-CM | POA: Insufficient documentation

## 2023-10-15 DIAGNOSIS — Z01818 Encounter for other preprocedural examination: Secondary | ICD-10-CM

## 2023-10-15 DIAGNOSIS — F419 Anxiety disorder, unspecified: Secondary | ICD-10-CM | POA: Diagnosis not present

## 2023-10-15 DIAGNOSIS — F32A Depression, unspecified: Secondary | ICD-10-CM | POA: Diagnosis not present

## 2023-10-15 DIAGNOSIS — M4802 Spinal stenosis, cervical region: Secondary | ICD-10-CM | POA: Diagnosis not present

## 2023-10-15 DIAGNOSIS — G8929 Other chronic pain: Secondary | ICD-10-CM | POA: Diagnosis not present

## 2023-10-15 DIAGNOSIS — K219 Gastro-esophageal reflux disease without esophagitis: Secondary | ICD-10-CM | POA: Diagnosis not present

## 2023-10-15 DIAGNOSIS — G959 Disease of spinal cord, unspecified: Secondary | ICD-10-CM | POA: Diagnosis not present

## 2023-10-15 DIAGNOSIS — G9589 Other specified diseases of spinal cord: Secondary | ICD-10-CM | POA: Insufficient documentation

## 2023-10-15 DIAGNOSIS — M19041 Primary osteoarthritis, right hand: Secondary | ICD-10-CM

## 2023-10-15 DIAGNOSIS — M2578 Osteophyte, vertebrae: Secondary | ICD-10-CM | POA: Insufficient documentation

## 2023-10-15 DIAGNOSIS — G992 Myelopathy in diseases classified elsewhere: Secondary | ICD-10-CM | POA: Diagnosis present

## 2023-10-15 HISTORY — PX: ANTERIOR CERVICAL DECOMP/DISCECTOMY FUSION: SHX1161

## 2023-10-15 LAB — ABO/RH: ABO/RH(D): O POS

## 2023-10-15 SURGERY — ANTERIOR CERVICAL DECOMPRESSION/DISCECTOMY FUSION 2 LEVELS
Anesthesia: General

## 2023-10-15 MED ORDER — SUCCINYLCHOLINE CHLORIDE 200 MG/10ML IV SOSY
PREFILLED_SYRINGE | INTRAVENOUS | Status: AC
  Filled 2023-10-15: qty 10

## 2023-10-15 MED ORDER — HYDROMORPHONE HCL 1 MG/ML IJ SOLN
INTRAMUSCULAR | Status: AC
  Filled 2023-10-15: qty 1

## 2023-10-15 MED ORDER — GLYCOPYRROLATE 0.2 MG/ML IJ SOLN
INTRAMUSCULAR | Status: AC
  Filled 2023-10-15: qty 1

## 2023-10-15 MED ORDER — OXYCODONE HCL 5 MG PO TABS
5.0000 mg | ORAL_TABLET | ORAL | Status: DC | PRN
  Administered 2023-10-15 – 2023-10-16 (×5): 5 mg via ORAL
  Filled 2023-10-15 (×5): qty 1

## 2023-10-15 MED ORDER — FENTANYL CITRATE (PF) 100 MCG/2ML IJ SOLN
INTRAMUSCULAR | Status: AC
  Filled 2023-10-15: qty 2

## 2023-10-15 MED ORDER — OXYCODONE HCL 5 MG PO TABS: 10.0000 mg | ORAL_TABLET | ORAL | Status: DC | PRN

## 2023-10-15 MED ORDER — ORAL CARE MOUTH RINSE: 15.0000 mL | Freq: Once | OROMUCOSAL | Status: AC

## 2023-10-15 MED ORDER — GLYCOPYRROLATE 0.2 MG/ML IJ SOLN
INTRAMUSCULAR | Status: DC | PRN
  Administered 2023-10-15 (×2): .2 mg via INTRAVENOUS

## 2023-10-15 MED ORDER — ACETAMINOPHEN 10 MG/ML IV SOLN
INTRAVENOUS | Status: AC
  Filled 2023-10-15: qty 100

## 2023-10-15 MED ORDER — DOCUSATE SODIUM 100 MG PO CAPS
100.0000 mg | ORAL_CAPSULE | Freq: Two times a day (BID) | ORAL | Status: DC
  Administered 2023-10-15 – 2023-10-16 (×2): 100 mg via ORAL
  Filled 2023-10-15 (×2): qty 1

## 2023-10-15 MED ORDER — SODIUM CHLORIDE 0.9% FLUSH
3.0000 mL | Freq: Two times a day (BID) | INTRAVENOUS | Status: DC
  Administered 2023-10-15: 3 mL via INTRAVENOUS

## 2023-10-15 MED ORDER — ONDANSETRON HCL 4 MG PO TABS: 4.0000 mg | ORAL_TABLET | Freq: Four times a day (QID) | ORAL | Status: DC | PRN

## 2023-10-15 MED ORDER — HYDROMORPHONE HCL 1 MG/ML IJ SOLN: 1.0000 mg | INTRAMUSCULAR | Status: DC | PRN

## 2023-10-15 MED ORDER — 0.9 % SODIUM CHLORIDE (POUR BTL) OPTIME
TOPICAL | Status: DC | PRN
  Administered 2023-10-15: 500 mL

## 2023-10-15 MED ORDER — VITAMIN D 25 MCG (1000 UNIT) PO TABS
2000.0000 [IU] | ORAL_TABLET | Freq: Every day | ORAL | Status: DC
Start: 2023-10-15 — End: 2023-10-16
  Administered 2023-10-15 – 2023-10-16 (×2): 2000 [IU] via ORAL
  Filled 2023-10-15 (×2): qty 2

## 2023-10-15 MED ORDER — OXYCODONE HCL 5 MG PO TABS
5.0000 mg | ORAL_TABLET | Freq: Once | ORAL | Status: AC | PRN
  Administered 2023-10-15: 5 mg via ORAL

## 2023-10-15 MED ORDER — HYDROMORPHONE HCL 1 MG/ML IJ SOLN
0.5000 mg | INTRAMUSCULAR | Status: AC | PRN
  Administered 2023-10-15 (×4): 0.5 mg via INTRAVENOUS

## 2023-10-15 MED ORDER — LABETALOL HCL 5 MG/ML IV SOLN
10.0000 mg | Freq: Once | INTRAVENOUS | Status: AC
  Administered 2023-10-15: 10 mg via INTRAVENOUS

## 2023-10-15 MED ORDER — OXYCODONE HCL 5 MG PO TABS
ORAL_TABLET | ORAL | Status: AC
  Filled 2023-10-15: qty 1

## 2023-10-15 MED ORDER — ESTRADIOL 0.5 MG PO TABS
0.5000 mg | ORAL_TABLET | Freq: Every day | ORAL | Status: DC
  Administered 2023-10-16: 0.5 mg via ORAL
  Filled 2023-10-15: qty 1

## 2023-10-15 MED ORDER — MENTHOL 3 MG MT LOZG
1.0000 | LOZENGE | OROMUCOSAL | Status: DC | PRN
  Administered 2023-10-16: 3 mg via ORAL
  Filled 2023-10-15: qty 9

## 2023-10-15 MED ORDER — SODIUM CHLORIDE 0.9 % IV SOLN
250.0000 mL | INTRAVENOUS | Status: DC
  Administered 2023-10-15: 250 mL via INTRAVENOUS

## 2023-10-15 MED ORDER — PROPOFOL 1000 MG/100ML IV EMUL
INTRAVENOUS | Status: AC
  Filled 2023-10-15: qty 100

## 2023-10-15 MED ORDER — CHLORHEXIDINE GLUCONATE 0.12 % MT SOLN
15.0000 mL | Freq: Once | OROMUCOSAL | Status: AC
  Administered 2023-10-15: 15 mL via OROMUCOSAL

## 2023-10-15 MED ORDER — PHENYLEPHRINE HCL-NACL 20-0.9 MG/250ML-% IV SOLN
INTRAVENOUS | Status: DC | PRN
  Administered 2023-10-15: 30 ug/min via INTRAVENOUS

## 2023-10-15 MED ORDER — METHOCARBAMOL 1000 MG/10ML IJ SOLN: 500.0000 mg | Freq: Four times a day (QID) | INTRAMUSCULAR | Status: DC | PRN

## 2023-10-15 MED ORDER — SODIUM CHLORIDE 0.9 % IV SOLN
INTRAVENOUS | Status: DC | PRN
  Administered 2023-10-15: .2 ug/kg/min via INTRAVENOUS

## 2023-10-15 MED ORDER — REMIFENTANIL HCL 1 MG IV SOLR
INTRAVENOUS | Status: AC
  Filled 2023-10-15: qty 1000

## 2023-10-15 MED ORDER — CEFAZOLIN IN SODIUM CHLORIDE 2-0.9 GM/100ML-% IV SOLN: 2.0000 g | Freq: Once | INTRAVENOUS | Status: DC

## 2023-10-15 MED ORDER — PROPOFOL 10 MG/ML IV BOLUS
INTRAVENOUS | Status: AC
  Filled 2023-10-15: qty 20

## 2023-10-15 MED ORDER — MIDAZOLAM HCL 2 MG/2ML IJ SOLN
INTRAMUSCULAR | Status: AC
  Filled 2023-10-15: qty 2

## 2023-10-15 MED ORDER — PROPOFOL 10 MG/ML IV BOLUS
INTRAVENOUS | Status: DC | PRN
  Administered 2023-10-15: 125 ug/kg/min via INTRAVENOUS
  Administered 2023-10-15: 120 mg via INTRAVENOUS

## 2023-10-15 MED ORDER — MIDAZOLAM HCL 2 MG/2ML IJ SOLN
INTRAMUSCULAR | Status: DC | PRN
  Administered 2023-10-15: 2 mg via INTRAVENOUS

## 2023-10-15 MED ORDER — EPHEDRINE SULFATE-NACL 50-0.9 MG/10ML-% IV SOSY
PREFILLED_SYRINGE | INTRAVENOUS | Status: DC | PRN
  Administered 2023-10-15: 10 mg via INTRAVENOUS
  Administered 2023-10-15: 15 mg via INTRAVENOUS

## 2023-10-15 MED ORDER — METHOCARBAMOL 500 MG PO TABS
500.0000 mg | ORAL_TABLET | Freq: Four times a day (QID) | ORAL | Status: DC | PRN
  Administered 2023-10-15: 500 mg via ORAL
  Filled 2023-10-15: qty 1

## 2023-10-15 MED ORDER — CELECOXIB 100 MG PO CAPS
100.0000 mg | ORAL_CAPSULE | Freq: Two times a day (BID) | ORAL | Status: DC
  Administered 2023-10-15 – 2023-10-16 (×2): 100 mg via ORAL
  Filled 2023-10-15 (×2): qty 1

## 2023-10-15 MED ORDER — CHLORHEXIDINE GLUCONATE 0.12 % MT SOLN
OROMUCOSAL | Status: AC
Start: 2023-10-15 — End: 2023-10-15
  Filled 2023-10-15: qty 15

## 2023-10-15 MED ORDER — ENOXAPARIN SODIUM 40 MG/0.4ML IJ SOSY
40.0000 mg | PREFILLED_SYRINGE | INTRAMUSCULAR | Status: DC
Start: 2023-10-16 — End: 2023-10-16
  Administered 2023-10-16: 40 mg via SUBCUTANEOUS
  Filled 2023-10-15: qty 0.4

## 2023-10-15 MED ORDER — LACTATED RINGERS IV SOLN: INTRAVENOUS | Status: DC | PRN

## 2023-10-15 MED ORDER — ACETAMINOPHEN 325 MG PO TABS: 650.0000 mg | ORAL_TABLET | ORAL | Status: DC | PRN

## 2023-10-15 MED ORDER — SUCCINYLCHOLINE CHLORIDE 200 MG/10ML IV SOSY
PREFILLED_SYRINGE | INTRAVENOUS | Status: DC | PRN
  Administered 2023-10-15: 120 mg via INTRAVENOUS

## 2023-10-15 MED ORDER — POLYETHYLENE GLYCOL 3350 17 G PO PACK: 17.0000 g | PACK | Freq: Every day | ORAL | Status: DC | PRN

## 2023-10-15 MED ORDER — BISACODYL 5 MG PO TBEC: 5.0000 mg | DELAYED_RELEASE_TABLET | Freq: Every day | ORAL | Status: DC | PRN

## 2023-10-15 MED ORDER — VITAMIN C 500 MG PO TABS
1000.0000 mg | ORAL_TABLET | Freq: Every day | ORAL | Status: DC
  Administered 2023-10-15 – 2023-10-16 (×2): 1000 mg via ORAL
  Filled 2023-10-15 (×2): qty 2

## 2023-10-15 MED ORDER — ONDANSETRON HCL 4 MG/2ML IJ SOLN: 4.0000 mg | Freq: Four times a day (QID) | INTRAMUSCULAR | Status: DC | PRN

## 2023-10-15 MED ORDER — EPHEDRINE 5 MG/ML INJ
INTRAVENOUS | Status: AC
  Filled 2023-10-15: qty 5

## 2023-10-15 MED ORDER — SENNA 8.6 MG PO TABS
1.0000 | ORAL_TABLET | Freq: Two times a day (BID) | ORAL | Status: DC
  Administered 2023-10-15 – 2023-10-16 (×2): 8.6 mg via ORAL
  Filled 2023-10-15 (×2): qty 1

## 2023-10-15 MED ORDER — LIDOCAINE HCL (CARDIAC) PF 100 MG/5ML IV SOSY
PREFILLED_SYRINGE | INTRAVENOUS | Status: DC | PRN
  Administered 2023-10-15: 60 mg via INTRAVENOUS

## 2023-10-15 MED ORDER — SODIUM CHLORIDE (PF) 0.9 % IJ SOLN
INTRAMUSCULAR | Status: AC
  Filled 2023-10-15: qty 20

## 2023-10-15 MED ORDER — ACETAMINOPHEN 500 MG PO TABS
1000.0000 mg | ORAL_TABLET | Freq: Four times a day (QID) | ORAL | Status: DC
  Administered 2023-10-16 (×2): 1000 mg via ORAL
  Filled 2023-10-15 (×3): qty 2

## 2023-10-15 MED ORDER — OXYCODONE HCL 5 MG/5ML PO SOLN: 5.0000 mg | Freq: Once | ORAL | Status: AC | PRN

## 2023-10-15 MED ORDER — DEXAMETHASONE SODIUM PHOSPHATE 10 MG/ML IJ SOLN
INTRAMUSCULAR | Status: AC
  Filled 2023-10-15: qty 1

## 2023-10-15 MED ORDER — ACETAMINOPHEN 10 MG/ML IV SOLN
INTRAVENOUS | Status: DC | PRN
  Administered 2023-10-15: 1000 mg via INTRAVENOUS

## 2023-10-15 MED ORDER — PHENOL 1.4 % MT LIQD: 1.0000 | OROMUCOSAL | Status: DC | PRN

## 2023-10-15 MED ORDER — SODIUM CHLORIDE 0.9% FLUSH: 3.0000 mL | INTRAVENOUS | Status: DC | PRN

## 2023-10-15 MED ORDER — LABETALOL HCL 5 MG/ML IV SOLN
INTRAVENOUS | Status: AC
  Filled 2023-10-15: qty 4

## 2023-10-15 MED ORDER — ONDANSETRON HCL 4 MG/2ML IJ SOLN
INTRAMUSCULAR | Status: DC | PRN
  Administered 2023-10-15: 4 mg via INTRAVENOUS

## 2023-10-15 MED ORDER — ONDANSETRON HCL 4 MG/2ML IJ SOLN
INTRAMUSCULAR | Status: AC
  Filled 2023-10-15: qty 2

## 2023-10-15 MED ORDER — ACETAMINOPHEN 650 MG RE SUPP: 650.0000 mg | RECTAL | Status: DC | PRN

## 2023-10-15 MED ORDER — CEFAZOLIN SODIUM-DEXTROSE 2-4 GM/100ML-% IV SOLN
INTRAVENOUS | Status: AC
  Filled 2023-10-15: qty 100

## 2023-10-15 MED ORDER — LIDOCAINE HCL (PF) 2 % IJ SOLN
INTRAMUSCULAR | Status: AC
  Filled 2023-10-15: qty 5

## 2023-10-15 MED ORDER — MAGNESIUM CITRATE PO SOLN: 1.0000 | Freq: Once | ORAL | Status: DC | PRN

## 2023-10-15 MED ORDER — CEFAZOLIN SODIUM-DEXTROSE 2-3 GM-%(50ML) IV SOLR
INTRAVENOUS | Status: DC | PRN
Start: 2023-10-15 — End: 2023-10-15
  Administered 2023-10-15: 2 g via INTRAVENOUS

## 2023-10-15 MED ORDER — LOSARTAN POTASSIUM 50 MG PO TABS
50.0000 mg | ORAL_TABLET | Freq: Every day | ORAL | Status: DC
  Administered 2023-10-15: 50 mg via ORAL
  Filled 2023-10-15: qty 1

## 2023-10-15 MED ORDER — BUPIVACAINE-EPINEPHRINE (PF) 0.5% -1:200000 IJ SOLN
INTRAMUSCULAR | Status: DC | PRN
  Administered 2023-10-15: 10 mL

## 2023-10-15 MED ORDER — MAGNESIUM OXIDE -MG SUPPLEMENT 400 (240 MG) MG PO TABS
400.0000 mg | ORAL_TABLET | Freq: Every evening | ORAL | Status: DC
  Administered 2023-10-15: 400 mg via ORAL
  Filled 2023-10-15: qty 1

## 2023-10-15 MED ORDER — SURGIFLO WITH THROMBIN (HEMOSTATIC MATRIX KIT) OPTIME
TOPICAL | Status: DC | PRN
  Administered 2023-10-15: 1 via TOPICAL

## 2023-10-15 MED ORDER — LACTATED RINGERS IV SOLN
INTRAVENOUS | Status: DC
Start: 2023-10-15 — End: 2023-10-15

## 2023-10-15 MED ORDER — FENTANYL CITRATE (PF) 100 MCG/2ML IJ SOLN
INTRAMUSCULAR | Status: DC | PRN
  Administered 2023-10-15 (×3): 25 ug via INTRAVENOUS
  Administered 2023-10-15: 50 ug via INTRAVENOUS

## 2023-10-15 MED ORDER — PANTOPRAZOLE SODIUM 40 MG IV SOLR
40.0000 mg | Freq: Every day | INTRAVENOUS | Status: DC
  Administered 2023-10-15: 40 mg via INTRAVENOUS
  Filled 2023-10-15: qty 10

## 2023-10-15 MED ORDER — ATORVASTATIN CALCIUM 20 MG PO TABS
40.0000 mg | ORAL_TABLET | Freq: Every day | ORAL | Status: DC
  Administered 2023-10-15 – 2023-10-16 (×2): 40 mg via ORAL
  Filled 2023-10-15 (×2): qty 2

## 2023-10-15 MED ORDER — DEXAMETHASONE SODIUM PHOSPHATE 10 MG/ML IJ SOLN
INTRAMUSCULAR | Status: DC | PRN
  Administered 2023-10-15: 10 mg via INTRAVENOUS

## 2023-10-15 MED ORDER — PHENYLEPHRINE HCL-NACL 20-0.9 MG/250ML-% IV SOLN
INTRAVENOUS | Status: AC
  Filled 2023-10-15: qty 250

## 2023-10-15 MED ORDER — FENTANYL CITRATE (PF) 100 MCG/2ML IJ SOLN
25.0000 ug | INTRAMUSCULAR | Status: DC | PRN
  Administered 2023-10-15 (×4): 25 ug via INTRAVENOUS

## 2023-10-15 SURGICAL SUPPLY — 37 items
BASIN KIT SINGLE STR (MISCELLANEOUS) ×1 IMPLANT
BIT DRILL ACP 13 (DRILL) IMPLANT
BRUSH SCRUB EZ 4% CHG (MISCELLANEOUS) ×1 IMPLANT
BUR NEURO DRILL SOFT 3.0X3.8M (BURR) ×1 IMPLANT
DERMABOND ADVANCED .7 DNX12 (GAUZE/BANDAGES/DRESSINGS) ×1 IMPLANT
DRAIN CHANNEL JP 10F RND 20C F (MISCELLANEOUS) IMPLANT
DRAPE C-ARM XRAY 36X54 (DRAPES) ×2 IMPLANT
DRAPE LAPAROTOMY 77X122 PED (DRAPES) ×1 IMPLANT
DRAPE MICROSCOPE SPINE 48X150 (DRAPES) ×1 IMPLANT
DRSG TEGADERM 2-3/8X2-3/4 SM (GAUZE/BANDAGES/DRESSINGS) IMPLANT
ELECTRODE REM PT RTRN 9FT ADLT (ELECTROSURGICAL) ×1 IMPLANT
EVACUATOR SILICONE 100CC (DRAIN) IMPLANT
FEE INTRAOP CADWELL SUPPLY NCS (MISCELLANEOUS) IMPLANT
FEE INTRAOP MONITOR IMPULS NCS (MISCELLANEOUS) IMPLANT
GLOVE BIOGEL PI IND STRL 7.0 (GLOVE) ×1 IMPLANT
GLOVE BIOGEL PI IND STRL 8 (GLOVE) ×1 IMPLANT
GLOVE SURG SYN 7.0 PF PI (GLOVE) ×1 IMPLANT
GLOVE SURG SYN 7.5 PF PI (GLOVE) ×1 IMPLANT
GOWN SRG XL LVL 3 NONREINFORCE (GOWNS) ×1 IMPLANT
GOWN STRL REUS W/ TWL XL LVL3 (GOWN DISPOSABLE) ×1 IMPLANT
KIT TURNOVER KIT A (KITS) ×1 IMPLANT
MANIFOLD NEPTUNE II (INSTRUMENTS) ×1 IMPLANT
NS IRRIG 500ML POUR BTL (IV SOLUTION) ×1 IMPLANT
PACK LAMINECTOMY ARMC (PACKS) ×1 IMPLANT
PAD ARMBOARD POSITIONER FOAM (MISCELLANEOUS) ×2 IMPLANT
PIN CASPAR 14 (PIN) ×1 IMPLANT
PLATE ACP 1.6X36 2LVL (Plate) IMPLANT
SCREW ACP VA ST 3.5X15 (Screw) IMPLANT
SPACER CERVICAL FRGE 12X14X5 (Spacer) IMPLANT
SPACER CERVICAL FRGE 12X14X6-7 (Spacer) IMPLANT
SPONGE KITTNER 5P (MISCELLANEOUS) ×1 IMPLANT
SURGIFLO W/THROMBIN 8M KIT (HEMOSTASIS) ×1 IMPLANT
SUT STRATA 3-0 30 PS-1 (SUTURE) ×1 IMPLANT
SUT VIC AB 3-0 SH 8-18 (SUTURE) ×1 IMPLANT
SYR 20ML LL LF (SYRINGE) ×1 IMPLANT
TAPE CLOTH 3X10 WHT NS LF (GAUZE/BANDAGES/DRESSINGS) ×2 IMPLANT
TRAP FLUID SMOKE EVACUATOR (MISCELLANEOUS) ×1 IMPLANT

## 2023-10-15 NOTE — H&P (View-Only) (Signed)
 Referring Physician:  No referring provider defined for this encounter.  Primary Physician:  Sowles, Krichna, MD  History of Present Illness: 10/15/2023 [name] is here today with a chief complaint of cervical myeloradiculopathy.  She does have a history of chronic back pain as well as chronic neck pain.  She is here today mostly for her neck pain.  She feels pain in her neck that radiates from her neck all the way down to her right hand.  She is also had progressive weakness numbness and tingling in the right side.  She has also noticed that the right side has had numbness tingling and decreased sensation in the right lower body and some of the trunk as well.  She has been managed by our pain team and been continue to follow by our orthopedics team.  There was concern for cervical myeloradiculopathy so she was referred to neurosurgery.   Conservative measures:  Physical therapy:  has not participated in PT Multimodal medical therapy including regular antiinflammatories:  Celebrex , Tylenol , Voltaren , Norco, Prednisone  Injections:  09/21/2019: Bilateral L5-S1 transforaminal ESI  08/19/2019: Bilateral L5-S1 transforaminal ESI  08/19/2011: Left hip joint injection 12/31/2010: Left C3-4 transforaminal ESI   The symptoms are causing a significant impact on the patient's life.   I have utilized the care everywhere function in epic to review the outside records available from external health systems.  Review of Systems:  A 10 point review of systems is negative, except for the pertinent positives and negatives detailed in the HPI.  Past Medical History: Past Medical History:  Diagnosis Date   Anxiety    Arthritis    Bradycardia    Bronchitis    Cervical myelopathy (HCC)    Cervical radiculopathy    Complication of anesthesia    woke up during hip surgery   DDD (degenerative disc disease), lumbar    Dysrhythmia    congenital bradycardia   GERD (gastroesophageal reflux  disease)    History of positive PPD 2009   INH through health dept CXR's needed for clearance   Hyperlipidemia    Hyperlipidemia    Hypertension    Insomnia    Lumbar spondylosis    Obesity    Rosacea    Spinal stenosis    Spondylosis of cervical spine    Vitamin D  deficiency     Past Surgical History: Past Surgical History:  Procedure Laterality Date   BILATERAL CARPAL TUNNEL RELEASE     CERVICAL CONE BIOPSY     COLONOSCOPY  10/2010   COLONOSCOPY  2004   HALLUX VALGUS CORRECTION Right 2005   HYSTERECTOMY ABDOMINAL WITH SALPINGECTOMY  1981   INCISION TENDON SHEATH HAND Right    JOINT REPLACEMENT Right 11/21/2010   knee   JOINT REPLACEMENT Left 11/04/2011   hip   JOINT REPLACEMENT Left 04/27/2012   knee   KNEE ARTHROSCOPY Right    KNEE ARTHROSCOPY Left 12/18/2011   SHOULDER ARTHROSCOPY WITH SUBACROMIAL DECOMPRESSION, ROTATOR CUFF REPAIR AND BICEP TENDON REPAIR Left 09/20/2020   Procedure: SHOULDER ARTHROSCOPY WITH DEBRIDEMENT, DECOMPRESSION, ROTATOR CUFF REPAIR AND BICEPS TENODESIS;  Surgeon: Edie Norleen PARAS, MD;  Location: [facility];  Service: Orthopedics;  Laterality: Left;    Allergies: Allergies as of 09/07/2023 - Review Complete 08/19/2023  Allergen Reaction Noted   Sulfa antibiotics Nausea Only 05/20/2016   Penicillins Rash 05/20/2016    Medications:  Current Facility-Administered Medications:    ceFAZolin  (ANCEF ) IVPB 2g/100 mL premix, 2 g, Intravenous, Once, Claudene Penne ORN,  MD   lactated ringers  infusion, , Intravenous, Continuous, Chesley Lendia CROME, MD, Last Rate: 10 mL/hr at 10/15/23 1018, New Bag at 10/15/23 1018  Social History: Social History   Tobacco Use   Smoking status: Never   Smokeless tobacco: Never  Vaping Use   Vaping status: Never Used  Substance Use Topics   Alcohol use: Yes    Alcohol/week: 1.0 standard drink of alcohol    Types: 1 Glasses of wine per week    Comment: occ wine   Drug use: No    Family Medical History: Family  History  Problem Relation Age of Onset   Alzheimer's disease Mother 42   Osteoporosis Mother    Stroke Father 70   Arthritis Sister    Basal cell carcinoma Sister    Breast cancer Maternal Aunt 75   Macular degeneration Other     Physical Examination: Vitals:   10/15/23 0948  BP: (!) 166/76  Pulse: 68  Resp: 16  Temp: (!) 97 F (36.1 C)  SpO2: 98%    General: Patient is in no apparent distress. Attention to examination is appropriate.  Neck:   Supple.  Full range of motion.  Respiratory: Patient is breathing without any difficulty.   NEUROLOGICAL:     Awake, alert, oriented to person, place, and time.  Speech is clear and fluent.   Cranial Nerves: Pupils equal round and reactive to light.  Facial tone is symmetric.  Facial sensation is symmetric. Shoulder shrug is symmetric. Tongue protrusion is midline.  On cervical extension she does demonstrate some Spurling type sign with radiation down her right shoulder into her hand.  Strength: Side Biceps Triceps Deltoid Interossei Grip Wrist Ext. Wrist Flex.  R 4+ 5 4+ 4 4 4  4+  L 5 5 5 5 5 5 5    Side Iliopsoas Quads Hamstring PF DF EHL  R 4 5 5 5  4+ 3  L 5 5 5 5 5 5    On the left side patient is 1-2 out of 3 in her reflexes, on the right side she does show hyperreflexia with spreading reflexes in the right upper extremity.  There is spreading reflex at the brachial radialis.  She has Tromner and Hoffman's on the right.  This not present on the left.  She has a crossed adductor sign on the right, this is not present on the left.  She has difficulty relaxing for clonus testing.  Toes are equivocal.  She has a slightly wide-based gait and is some slight unsteadiness on her feet while ambulating.  She states that this has been present but slowly getting worse over the past few years.  She feels like her symptoms overall have been getting much worse over the past few months.  Imaging: Narrative & Impression  CLINICAL DATA:   78 year old female with neck pain radiating to the shoulder and arm for 2 weeks.   EXAM: MRI CERVICAL SPINE WITHOUT CONTRAST   TECHNIQUE: Multiplanar, multisequence MR imaging of the cervical spine was performed. No intravenous contrast was administered.   COMPARISON:  Cervical spine MRI 07/05/2020.   FINDINGS: Alignment: Chronic straightening of cervical lordosis with chronic upper thoracic degenerative appearing anterolisthesis, similar anterolisthesis now at C7-T1 (2-3 mm) which is new since 2022. Mild underlying cervical spine scoliosis.   Vertebrae: Bulky chronic degenerative endplate spurring and chronic marrow signal changes superimposed on normal background bone marrow signal. No convincing cervical spine marrow edema or acute osseous abnormality. Faint degenerative endplate marrow edema in the  upper thoracic spine at T1-T2.   Cord: Widespread degenerative spinal cord mass effect, severe now at C4-C5 (series 8, image 19). And abnormal right hemi cord signal just caudal to that level suspicious for myelomalacia (series 8, image 22). And this was present in 2022.   Multilevel less pronounced degenerative spinal cord mass effect elsewhere. See details below.   Posterior Fossa, vertebral arteries, paraspinal tissues: Cervicomedullary junction is within normal limits. Negative visible posterior fossa. Preserved major vascular flow voids in the bilateral neck. Tortuous carotid arteries. Negative visible lung apices.   Disc levels:   C2-C3: Mild to moderate facet and ligament flavum hypertrophy greater on the left. Mild left C3 foraminal stenosis.   C3-C4: Chronic severe disc and endplate degeneration. Chronic severe disc space loss and bulky circumferential disc osteophyte complex asymmetric to the left. Moderate to severe left facet hypertrophy. Mild facet and ligament flavum hypertrophy otherwise. Mild to moderate spinal stenosis, mild spinal cord mass effect  (series 8, image 16). Severe left and moderate right C4 foraminal stenosis.   C4-C5: Severe chronic, bulky disc and endplate degeneration with circumferential disc osteophyte complex and moderate to severe facet and ligament flavum hypertrophy. Chronic severe spinal stenosis and spinal cord mass effect (series 8, image 19). Severe left and moderate right C5 foraminal stenosis.   C5-C6: Similar advanced disc and endplate degeneration with bulky disc osteophyte complex and mild to moderate posterior element hypertrophy. Moderate spinal stenosis and spinal cord mass effect at this level (series 8, image 23). Right hemicord myelomalacia is chronic. Moderate right greater than left C6 foraminal stenosis.   C6-C7: Chronic severe disc space loss at this level with less pronounced endplate spurring. No obvious ankylosis. Moderate spinal stenosis with mild spinal cord mass effect on series 8, image 28. No convincing foraminal stenosis.   C7-T1: New anterolisthesis. Bulky disc osteophyte complex and facet and ligament flavum hypertrophy. Moderate spinal stenosis with mild spinal cord mass effect (series 8, image 32). Severe left greater than right C8 foraminal stenosis. This level has substantially progressed since 2022.   Upper thoracic spine degeneration, spondylolisthesis. Only mild visible upper thoracic spinal stenosis.   IMPRESSION: 1. Severe chronic cervical spine degeneration. Bulky endplate and posterior element degeneration throughout. Widespread cervical spinal stenosis and spinal cord mass effect: - Severe at C4-C5. - Moderate at C5-C6, with chronic right hemi-cord Myelomalacia at this level. - up to moderate at C3-C4 with mild spinal cord mass effect. Severe left C4 foraminal stenosis. - Moderate at C6-C7 with mild spinal cord mass effect.   2. Substantially progressed C7-T1 degeneration since 2022, new spondylolisthesis and new moderate spinal stenosis with mild  cord mass effect. Progressed and severe left greater than right C8 foraminal stenosis.     Electronically Signed   By: VEAR Hurst M.D.   On: 08/14/2023 09:14     Narrative & Impression  CLINICAL DATA:  Neck pain   EXAM: CERVICAL SPINE - COMPLETE 4+ VIEW   COMPARISON:  08/14/2023   FINDINGS: The odontoid is within normal limits. Seven cervical segments are well visualized. Extensive osteophytic change and facet hypertrophic changes are noted. The overall appearance is similar to that seen on recent MRI. No prevertebral soft tissue changes are seen. Flexion and extension views show no significant instability.   IMPRESSION: Multilevel degenerative change without evidence of instability.     Electronically Signed   By: Oneil Devonshire M.D.   On: 08/26/2023 02:24     I have personally reviewed the images and  agree with the above interpretation.  Medical Decision Making/Assessment and Plan: Ms. 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  She is here today for evaluation.  She has a history of chronic back pain as well as neck issues.  She has been treated for her back pain hip issues and bilateral knee issues.  She has been conservatively managed for her chronic neck pain.  She is being seen today for signs and symptoms consistent with cervical spondylitic myelopathy.  On physical examination she is hyperreflexic on the right with decree sensation, she also has evidence of spinal cord type presentation of weakness with weakness in her shoulders and hands predominantly as well as in her hip and dorsiflexors.  She does have other levels of weakness but her most significant is in this distribution.  She is also pathologic reflexes with a positive Tromner/Hoffmann's on the right as well as crossed adductors.  Her imaging has been reviewed and shows cervical spondylitic myelopathy and compression of the cervical spinal cord with myelomalacia most severe from C4-C6,  there is some mild compression at the C6-7 level without any evidence of myelomalacia.  Her flexion-extension x-rays were evaluated, does not appear to need extension down to C7.  Will plan for C4-C6 anterior cervical discectomy and fusion.  I did discuss with her that risks and benefits include but are not limited to weakness numbness tingling graft failure, need for further surgery, need for further fusions.  I did discuss also with her that given her level of stenosis that some patients will require posterior surgery as well.  Would like to minimize the amount of surgery that she has and would like to go for the levels of the most significant compression and myelomalacia.  For this we perform a C4-C6 anterior cervical discectomy and fusion.   [name] MD/MSCR Neurosurgery

## 2023-10-15 NOTE — Progress Notes (Signed)
 Referring Physician:  No referring provider defined for this encounter.  Primary Physician:  Sowles, Krichna, MD  History of Present Illness: 10/15/2023 Kari Lee is here today with a chief complaint of cervical myeloradiculopathy.  She does have a history of chronic back pain as well as chronic neck pain.  She is here today mostly for her neck pain.  She feels pain in her neck that radiates from her neck all the way down to her right hand.  She is also had progressive weakness numbness and tingling in the right side.  She has also noticed that the right side has had numbness tingling and decreased sensation in the right lower body and some of the trunk as well.  She has been managed by our pain team and been continue to follow by our orthopedics team.  There was concern for cervical myeloradiculopathy so she was referred to neurosurgery.   Conservative measures:  Physical therapy:  has not participated in PT Multimodal medical therapy including regular antiinflammatories:  Celebrex , Tylenol , Voltaren , Norco, Prednisone  Injections:  09/21/2019: Bilateral L5-S1 transforaminal ESI  08/19/2019: Bilateral L5-S1 transforaminal ESI  08/19/2011: Left hip joint injection 12/31/2010: Left C3-4 transforaminal ESI   The symptoms are causing a significant impact on the patient's life.   I have utilized the care everywhere function in epic to review the outside records available from external health systems.  Review of Systems:  A 10 point review of systems is negative, except for the pertinent positives and negatives detailed in the HPI.  Past Medical History: Past Medical History:  Diagnosis Date   Anxiety    Arthritis    Bradycardia    Bronchitis    Cervical myelopathy (HCC)    Cervical radiculopathy    Complication of anesthesia    woke up during hip surgery   DDD (degenerative disc disease), lumbar    Dysrhythmia    congenital bradycardia   GERD (gastroesophageal reflux  disease)    History of positive PPD 2009   INH through health dept CXR's needed for clearance   Hyperlipidemia    Hyperlipidemia    Hypertension    Insomnia    Lumbar spondylosis    Obesity    Rosacea    Spinal stenosis    Spondylosis of cervical spine    Vitamin D  deficiency     Past Surgical History: Past Surgical History:  Procedure Laterality Date   BILATERAL CARPAL TUNNEL RELEASE     CERVICAL CONE BIOPSY     COLONOSCOPY  10/2010   COLONOSCOPY  2004   HALLUX VALGUS CORRECTION Right 2005   HYSTERECTOMY ABDOMINAL WITH SALPINGECTOMY  1981   INCISION TENDON SHEATH HAND Right    JOINT REPLACEMENT Right 11/21/2010   knee   JOINT REPLACEMENT Left 11/04/2011   hip   JOINT REPLACEMENT Left 04/27/2012   knee   KNEE ARTHROSCOPY Right    KNEE ARTHROSCOPY Left 12/18/2011   SHOULDER ARTHROSCOPY WITH SUBACROMIAL DECOMPRESSION, ROTATOR CUFF REPAIR AND BICEP TENDON REPAIR Left 09/20/2020   Procedure: SHOULDER ARTHROSCOPY WITH DEBRIDEMENT, DECOMPRESSION, ROTATOR CUFF REPAIR AND BICEPS TENODESIS;  Surgeon: Edie Norleen PARAS, MD;  Location: [facility];  Service: Orthopedics;  Laterality: Left;    Allergies: Allergies as of 09/07/2023 - Review Complete 08/19/2023  Allergen Reaction Noted   Sulfa antibiotics Nausea Only 05/20/2016   Penicillins Rash 05/20/2016    Medications:  Current Facility-Administered Medications:    ceFAZolin  (ANCEF ) IVPB 2g/100 mL premix, 2 g, Intravenous, Once, Claudene Penne ORN,  MD   lactated ringers  infusion, , Intravenous, Continuous, Chesley Lendia CROME, MD, Last Rate: 10 mL/hr at 10/15/23 1018, New Bag at 10/15/23 1018  Social History: Social History   Tobacco Use   Smoking status: Never   Smokeless tobacco: Never  Vaping Use   Vaping status: Never Used  Substance Use Topics   Alcohol use: Yes    Alcohol/week: 1.0 standard drink of alcohol    Types: 1 Glasses of wine per week    Comment: occ wine   Drug use: No    Family Medical History: Family  History  Problem Relation Age of Onset   Alzheimer's disease Mother 42   Osteoporosis Mother    Stroke Father 70   Arthritis Sister    Basal cell carcinoma Sister    Breast cancer Maternal Aunt 75   Macular degeneration Other     Physical Examination: Vitals:   10/15/23 0948  BP: (!) 166/76  Pulse: 68  Resp: 16  Temp: (!) 97 F (36.1 C)  SpO2: 98%    General: Patient is in no apparent distress. Attention to examination is appropriate.  Neck:   Supple.  Full range of motion.  Respiratory: Patient is breathing without any difficulty.   NEUROLOGICAL:     Awake, alert, oriented to person, place, and time.  Speech is clear and fluent.   Cranial Nerves: Pupils equal round and reactive to light.  Facial tone is symmetric.  Facial sensation is symmetric. Shoulder shrug is symmetric. Tongue protrusion is midline.  On cervical extension she does demonstrate some Spurling type sign with radiation down her right shoulder into her hand.  Strength: Side Biceps Triceps Deltoid Interossei Grip Wrist Ext. Wrist Flex.  R 4+ 5 4+ 4 4 4  4+  L 5 5 5 5 5 5 5    Side Iliopsoas Quads Hamstring PF DF EHL  R 4 5 5 5  4+ 3  L 5 5 5 5 5 5    On the left side patient is 1-2 out of 3 in her reflexes, on the right side she does show hyperreflexia with spreading reflexes in the right upper extremity.  There is spreading reflex at the brachial radialis.  She has Tromner and Hoffman's on the right.  This not present on the left.  She has a crossed adductor sign on the right, this is not present on the left.  She has difficulty relaxing for clonus testing.  Toes are equivocal.  She has a slightly wide-based gait and is some slight unsteadiness on her feet while ambulating.  She states that this has been present but slowly getting worse over the past few years.  She feels like her symptoms overall have been getting much worse over the past few months.  Imaging: Narrative & Impression  CLINICAL DATA:   78 year old female with neck pain radiating to the shoulder and arm for 2 weeks.   EXAM: MRI CERVICAL SPINE WITHOUT CONTRAST   TECHNIQUE: Multiplanar, multisequence MR imaging of the cervical spine was performed. No intravenous contrast was administered.   COMPARISON:  Cervical spine MRI 07/05/2020.   FINDINGS: Alignment: Chronic straightening of cervical lordosis with chronic upper thoracic degenerative appearing anterolisthesis, similar anterolisthesis now at C7-T1 (2-3 mm) which is new since 2022. Mild underlying cervical spine scoliosis.   Vertebrae: Bulky chronic degenerative endplate spurring and chronic marrow signal changes superimposed on normal background bone marrow signal. No convincing cervical spine marrow edema or acute osseous abnormality. Faint degenerative endplate marrow edema in the  upper thoracic spine at T1-T2.   Cord: Widespread degenerative spinal cord mass effect, severe now at C4-C5 (series 8, image 19). And abnormal right hemi cord signal just caudal to that level suspicious for myelomalacia (series 8, image 22). And this was present in 2022.   Multilevel less pronounced degenerative spinal cord mass effect elsewhere. See details below.   Posterior Fossa, vertebral arteries, paraspinal tissues: Cervicomedullary junction is within normal limits. Negative visible posterior fossa. Preserved major vascular flow voids in the bilateral neck. Tortuous carotid arteries. Negative visible lung apices.   Disc levels:   C2-C3: Mild to moderate facet and ligament flavum hypertrophy greater on the left. Mild left C3 foraminal stenosis.   C3-C4: Chronic severe disc and endplate degeneration. Chronic severe disc space loss and bulky circumferential disc osteophyte complex asymmetric to the left. Moderate to severe left facet hypertrophy. Mild facet and ligament flavum hypertrophy otherwise. Mild to moderate spinal stenosis, mild spinal cord mass effect  (series 8, image 16). Severe left and moderate right C4 foraminal stenosis.   C4-C5: Severe chronic, bulky disc and endplate degeneration with circumferential disc osteophyte complex and moderate to severe facet and ligament flavum hypertrophy. Chronic severe spinal stenosis and spinal cord mass effect (series 8, image 19). Severe left and moderate right C5 foraminal stenosis.   C5-C6: Similar advanced disc and endplate degeneration with bulky disc osteophyte complex and mild to moderate posterior element hypertrophy. Moderate spinal stenosis and spinal cord mass effect at this level (series 8, image 23). Right hemicord myelomalacia is chronic. Moderate right greater than left C6 foraminal stenosis.   C6-C7: Chronic severe disc space loss at this level with less pronounced endplate spurring. No obvious ankylosis. Moderate spinal stenosis with mild spinal cord mass effect on series 8, image 28. No convincing foraminal stenosis.   C7-T1: New anterolisthesis. Bulky disc osteophyte complex and facet and ligament flavum hypertrophy. Moderate spinal stenosis with mild spinal cord mass effect (series 8, image 32). Severe left greater than right C8 foraminal stenosis. This level has substantially progressed since 2022.   Upper thoracic spine degeneration, spondylolisthesis. Only mild visible upper thoracic spinal stenosis.   IMPRESSION: 1. Severe chronic cervical spine degeneration. Bulky endplate and posterior element degeneration throughout. Widespread cervical spinal stenosis and spinal cord mass effect: - Severe at C4-C5. - Moderate at C5-C6, with chronic right hemi-cord Myelomalacia at this level. - up to moderate at C3-C4 with mild spinal cord mass effect. Severe left C4 foraminal stenosis. - Moderate at C6-C7 with mild spinal cord mass effect.   2. Substantially progressed C7-T1 degeneration since 2022, new spondylolisthesis and new moderate spinal stenosis with mild  cord mass effect. Progressed and severe left greater than right C8 foraminal stenosis.     Electronically Signed   By: VEAR Hurst M.D.   On: 08/14/2023 09:14     Narrative & Impression  CLINICAL DATA:  Neck pain   EXAM: CERVICAL SPINE - COMPLETE 4+ VIEW   COMPARISON:  08/14/2023   FINDINGS: The odontoid is within normal limits. Seven cervical segments are well visualized. Extensive osteophytic change and facet hypertrophic changes are noted. The overall appearance is similar to that seen on recent MRI. No prevertebral soft tissue changes are seen. Flexion and extension views show no significant instability.   IMPRESSION: Multilevel degenerative change without evidence of instability.     Electronically Signed   By: Oneil Devonshire M.D.   On: 08/26/2023 02:24     I have personally reviewed the images and  agree with the above interpretation.  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  She is here today for evaluation.  She has a history of chronic back pain as well as neck issues.  She has been treated for her back pain hip issues and bilateral knee issues.  She has been conservatively managed for her chronic neck pain.  She is being seen today for signs and symptoms consistent with cervical spondylitic myelopathy.  On physical examination she is hyperreflexic on the right with decree sensation, she also has evidence of spinal cord type presentation of weakness with weakness in her shoulders and hands predominantly as well as in her hip and dorsiflexors.  She does have other levels of weakness but her most significant is in this distribution.  She is also pathologic reflexes with a positive Tromner/Hoffmann's on the right as well as crossed adductors.  Her imaging has been reviewed and shows cervical spondylitic myelopathy and compression of the cervical spinal cord with myelomalacia most severe from C4-C6,  there is some mild compression at the C6-7 level without any evidence of myelomalacia.  Her flexion-extension x-rays were evaluated, does not appear to need extension down to C7.  Will plan for C4-C6 anterior cervical discectomy and fusion.  I did discuss with her that risks and benefits include but are not limited to weakness numbness tingling graft failure, need for further surgery, need for further fusions.  I did discuss also with her that given her level of stenosis that some patients will require posterior surgery as well.  Would like to minimize the amount of surgery that she has and would like to go for the levels of the most significant compression and myelomalacia.  For this we perform a C4-C6 anterior cervical discectomy and fusion.   [name] MD/MSCR Neurosurgery

## 2023-10-15 NOTE — Discharge Instructions (Signed)
 Your surgeon has performed an operation on your cervical spine (neck) to relieve pressure on the spinal cord and/or nerves. This involved making an incision in the front of your neck and removing one or more of the discs that support your spine. Next, a small piece of bone, a titanium plate, and screws were used to fuse two or more of the vertebrae (bones) together.  The following are instructions to help in your recovery once you have been discharged from the hospital. Even if you feel well, it is important that you follow these activity guidelines. If you do not let your neck heal properly from the surgery, you can increase the chance of return of your symptoms and other complications.  * Do not take anti-inflammatory medications for 3 months after surgery (naproxen [Aleve], ibuprofen  [Advil , Motrin ], celecoxib [Celebrex], etc.). These medications can prevent your bones from healing properly.  Activity    No bending, lifting, or twisting ("BLT"). Avoid lifting objects heavier than 10 pounds (gallon milk jug).  Where possible, avoid household activities that involve lifting, bending, reaching, pushing, or pulling such as laundry, vacuuming, grocery shopping, and childcare. Try to arrange for help from friends and family for these activities while your back heals.  Increase physical activity slowly as tolerated.  Taking short walks is encouraged, but avoid strenuous exercise. Do not jog, run, bicycle, lift weights, or participate in any other exercises unless specifically allowed by your doctor.  Talk to your doctor before resuming sexual activity.  You should not drive until cleared by your doctor.  Until released by your doctor, you should not return to work or school.  You should rest at home and let your body heal.   You may shower three days after your surgery.  After showering, lightly dab your incision dry. Do not take a tub bath or go swimming until approved by your doctor at your  follow-up appointment.  If your doctor ordered a cervical collar (neck brace) for you, you should wear it whenever you are out of bed. You may remove it when lying down or sleeping, but you should wear it at all other times. Not all neck surgeries require a cervical collar.  If you smoke, we strongly recommend that you quit.  Smoking has been proven to interfere with normal bone healing and will dramatically reduce the success rate of your surgery. Please contact [facility] (800-QUIT-NOW) and use the resources at www.[facility].com for assistance in stopping smoking.  Surgical Incision   If you have a dressing on your incision, you may remove it two days after your surgery. Keep your incision area clean and dry.  If you have staples or stitches on your incision, you should have a follow up scheduled for removal. If you do not have staples or stitches, you will have steri-strips (small pieces of surgical tape) or Dermabond glue. The steri-strips/glue should begin to peel away within about a week (it is fine if the steri-strips fall off before then). If the strips are still in place one week after your surgery, you may gently remove them.  Diet           You may return to your usual diet. However, you may experience discomfort when swallowing in the first month after your surgery. This is normal. You may find that softer foods are more comfortable for you to swallow. Be sure to stay hydrated.  You have been prescribed narcotic pain medications.  This often will cause constipation along with the anesthesia  that you underwent.  Please obtain Colace and senna. This has been sent to your local pharmacy, but at times it is not covered by insurance and you may obtain it over the counter..  You should take a stool softener and laxative for the duration of you taking the narcotic pain medications.  When to Contact Us   You may experience pain in your neck and/or pain between your shoulder blades. This is  normal and should improve in the next few weeks with the help of pain medication, muscle relaxers, and rest. Some patients report that a warm compress on the back of the neck or between the shoulder blades helps.  However, should you experience any of the following, contact us  immediately: New numbness or weakness Pain that is progressively getting worse, and is not relieved by your pain medication, muscle relaxers, rest, and warm compresses Bleeding, redness, swelling, pain, or drainage from surgical incision Chills or flu-like symptoms Fever greater than 101.0 F (38.3 C) Inability to eat, drink fluids, or take medications Problems with bowel or bladder functions Difficulty breathing or shortness of breath Warmth, tenderness, or swelling in your calf Contact Information How to contact us :  If you have any questions/concerns before or after surgery, you can reach us  at [phone], or you can send a mychart message. We can be reached by phone or mychart 8am-4pm, Monday-Friday.  *Please note: Calls after 4pm are forwarded to a third party answering service. Mychart messages are not routinely monitored during evenings, weekends, and holidays. Please call our office to contact the answering service for urgent concerns during non-business hours.

## 2023-10-15 NOTE — Interval H&P Note (Signed)
 History and Physical Interval Note:  10/15/2023 10:36 AM  Kari Lee  has presented today for surgery, with the diagnosis of M54.12 Cervical radiculopathy M48.02, G99.2 Stenosis of cervical spine with myelopathy.  The various methods of treatment have been discussed with the patient and family. After consideration of risks, benefits and other options for treatment, the patient has consented to  Procedure(s) with comments: ANTERIOR CERVICAL DECOMPRESSION/DISCECTOMY FUSION 2 LEVELS (N/A) - C4-6 ANTERIOR CERVICAL DISCECTOMY AND FUSION as a surgical intervention.  The patient's history has been reviewed, patient examined, no change in status, stable for surgery.  I have reviewed the patient's chart and labs.  Questions were answered to the patient's satisfaction.    Heart and lungs clear   [name]

## 2023-10-15 NOTE — Anesthesia Preprocedure Evaluation (Signed)
 Anesthesia Evaluation  Patient identified by MRN, date of birth, ID band Patient awake    Reviewed: Allergy & Precautions, NPO status , Patient's Chart, lab work & pertinent test results  History of Anesthesia Complications Negative for: history of anesthetic complications  Airway Mallampati: II  TM Distance: >3 FB Neck ROM: Full    Dental  (+) Teeth Intact   Pulmonary neg pulmonary ROS, neg sleep apnea, neg COPD, Patient abstained from smoking.Not current smoker   Pulmonary exam normal breath sounds clear to auscultation       Cardiovascular Exercise Tolerance: Good hypertension, (-) CAD and (-) Past MI + dysrhythmias  Rhythm:Regular Rate:Normal - Systolic murmurs Congenital bradycardia   Neuro/Psych  PSYCHIATRIC DISORDERS Anxiety Depression    Cervical spine stenosis. No interventions per neurosurgeon  Neuromuscular disease    GI/Hepatic ,GERD  ,,(+)     (-) substance abuse    Endo/Other  neg diabetes    Renal/GU      Musculoskeletal   Abdominal   Peds  Hematology   Anesthesia Other Findings Past Medical History: No date: Anxiety No date: Arthritis No date: Bradycardia No date: Bronchitis No date: Complication of anesthesia     Comment:  woke up during hip surgery No date: Dysrhythmia     Comment:  congenital bradycardia No date: GERD (gastroesophageal reflux disease) 2009: History of positive PPD 2009: History of positive PPD     Comment:  INH through health dept CXR's needed for clearance No date: Hyperlipidemia No date: Hypertension No date: Insomnia No date: Lumbar spondylosis     Comment:  Managed by Dr. Ozell Flake with [facility] No date: Obesity No date: Rosacea No date: Spinal stenosis No date: Spondylosis of cervical spine  Reproductive/Obstetrics                              Anesthesia Physical Anesthesia Plan  ASA: 2  Anesthesia Plan: General ETT    Post-op Pain Management:    Induction: Intravenous  PONV Risk Score and Plan: 3 and Midazolam , TIVA, Propofol  infusion, Dexamethasone  and Ondansetron   Airway Management Planned: Oral ETT  Additional Equipment:   Intra-op Plan:   Post-operative Plan: Extubation in OR  Informed Consent: I have reviewed the patients History and Physical, chart, labs and discussed the procedure including the risks, benefits and alternatives for the proposed anesthesia with the patient or authorized representative who has indicated his/her understanding and acceptance.     Dental Advisory Given  Plan Discussed with: Anesthesiologist, CRNA and Surgeon  Anesthesia Plan Comments: (Patient consented for risks of anesthesia including but not limited to:  - adverse reactions to medications - damage to eyes, teeth, lips or other oral mucosa - nerve damage due to positioning  - sore throat or hoarseness - Damage to heart, brain, nerves, lungs, other parts of body or loss of life  Patient voiced understanding and assent.)        Anesthesia Quick Evaluation

## 2023-10-15 NOTE — Transfer of Care (Signed)
 Immediate Anesthesia Transfer of Care Note  Patient: Kari Lee  Procedure(s) Performed: ANTERIOR CERVICAL DECOMPRESSION/DISCECTOMY FUSION 2 LEVELS  Patient Location: PACU  Anesthesia Type:General  Level of Consciousness: drowsy and patient cooperative  Airway & Oxygen Therapy: Patient Spontanous Breathing and Patient connected to face mask oxygen  Post-op Assessment: Report given to RN and Post -op Vital signs reviewed and stable  Post vital signs: Reviewed and stable  Last Vitals:  Vitals Value Taken Time  BP 169/88 10/15/23 14:20  Temp    Pulse 95 10/15/23 14:20  Resp 10 10/15/23 14:20  SpO2 95 % 10/15/23 14:20  Vitals shown include unfiled device data.  Last Pain:  Vitals:   10/15/23 1420  TempSrc: Tympanic  PainSc: 0-No pain         Complications: No notable events documented.

## 2023-10-15 NOTE — Anesthesia Procedure Notes (Addendum)
 Procedure Name: Intubation Date/Time: 10/15/2023 11:21 AM  Performed by: Lorrene Camelia LABOR, CRNAPre-anesthesia Checklist: Patient identified, Patient being monitored, Timeout performed, Emergency Drugs available and Suction available Patient Re-evaluated:Patient Re-evaluated prior to induction Oxygen Delivery Method: Circle system utilized Preoxygenation: Pre-oxygenation with 100% oxygen Induction Type: IV induction Ventilation: Mask ventilation without difficulty Laryngoscope Size: Mac, 3 and McGrath Grade View: Grade I Tube type: Oral Tube size: 7.0 mm Number of attempts: 1 Airway Equipment and Method: Stylet Placement Confirmation: ETT inserted through vocal cords under direct vision, positive ETCO2 and breath sounds checked- equal and bilateral Secured at: 21 cm Tube secured with: Tape Dental Injury: Teeth and Oropharynx as per pre-operative assessment  Comments: Soft bite block

## 2023-10-15 NOTE — Op Note (Signed)
 Indications: 78 year old woman with a history of progressive cervical myeloradiculopathy.  She was having gait abnormality gait difficulty, dexterity issues was found to have severe cer severe stenosis noted secondary to disc osteophyte complex with vical stenosis with T2 signal change and myelomalacia.  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   Preoperative Note:   Risks of surgery discussed include: infection, bleeding, stroke, coma, death, paralysis, CSF leak, nerve/spinal cord injury, numbness, tingling, weakness, complex regional pain syndrome, recurrent stenosis and/or disc herniation, vascular injury, development of instability, neck/back pain, need for further surgery, persistent symptoms, development of deformity, and the risks of anesthesia. The patient understood these risks and agreed to proceed.  Procedure:  1) Anterior cervical diskectomy and fusion at C 4-6 2) Anterior cervical instrumentation at C 4-6 3) Structural allograft consisting of corticocancellous allograft   Procedure: After obtaining informed consent, the patient taken to the operating room, placed in supine position, general anesthesia induced.  The patient had a small shoulder roll placed behind their shoulders.  The patient received preop antibiotics and IV Decadron .  The patient had a neck incision outlined, was prepped and draped in usual sterile fashion. The incision was injected with local anesthetic.   An incision was opened,  dissection taken down medial to the carotid artery and jugular vein, lateral to the trachea and esophagus.  The prevertebral fascia identified and a localizing x-ray demonstrated the correct level.  The longus colli were dissected laterally, and self-retaining retractors placed to open the operative field. The microscope was then brought into the field.  With this complete, distractor pins were placed in the vertebral bodies of C 5 and C 6. The distractor was placed, and the annulus at C 5/6 was opened using a bovie.  Curettes and pituitary rongeurs used to remove the majority of disk, then the drill was used to remove the posterior osteophyte and begin the foraminotomies. The nerve hook was used to elevate the posterior longitudinal ligament, which was then removed with Kerrison rongeurs. The microblunt nerve hook could be passed out the foramen bilaterally.   Meticulous hemostasis obtained.  Structural allograft was tapped behind the anterior lip of the vertebral body at C 5/6 (6 mm).    With this complete, distractor pins were placed in the vertebral bodies of C 4/5. The distractor was placed, and the annulus at 4/5 was opened using a bovie.  Curettes and pituitary rongeurs used to remove the majority of disk, then the drill was used to remove the posterior osteophyte and begin the foraminotomies. The nerve hook was used to elevate the posterior longitudinal ligament, which was then removed with Kerrison rongeurs. The microblunt nerve hook could be passed out the foramen bilaterally.   Meticulous hemostasis obtained.  Structural allograft was tapped behind the anterior lip of the vertebral body at C 4/5 (5 mm).    The caspar distractor was removed, and bone wax used for hemostasis. A separate, 36 mm plate was chosen.  Two screws placed in each vertebral body, respectively making sure the screws were behind the locking mechanism.  Final AP and lateral radiographs were taken.   With everything in good  position, the wound was irrigated copiously and meticulous hemostasis obtained.  Wound was closed in 2 layers using interrupted inverted 3-0 Vicryl sutures.  The wound was dressed with dermabond, the head of bed at 30 degrees, taken to recovery room in stable condition.  No new postop neurological deficits were identified.  Sponge and pattie counts were correct at the end of the procedure.    I performed the procedure with the assistance of [employer], they aided in the exposure, retraction of critical structures, visualization of critical structures, stabilization during instrumentation, and closure.  Implants: Implant Name Type Inv. Item Serial No. Manufacturer Lot No. LRB No. Used Action  SPACER CERVICAL FRGE 12X14X5 - [id number] Spacer SPACER CERVICAL FRGE [id number] [employer] [id number] DONOR N/A 1 Implanted  SPACER CERVICAL FRGE 12X14X6-7 - [id number] Spacer SPACER CERVICAL FRGE [id number] [facility] MEDICAL [id number] DONOR N/A 1 Implanted  PLATE ACP 8.3K63 2LVL - [id number] Plate PLATE ACP 8.3K63 2LVL  [employer]  N/A 1 Implanted  SCREW ACP VA ST 3.5X15 - [id number] Screw SCREW ACP VA ST 3.5X15  [employer]  N/A 6 Implanted      [name], MD/MSCR

## 2023-10-16 ENCOUNTER — Encounter: Payer: Self-pay | Admitting: Neurosurgery

## 2023-10-16 ENCOUNTER — Other Ambulatory Visit: Payer: Self-pay

## 2023-10-16 DIAGNOSIS — M4802 Spinal stenosis, cervical region: Secondary | ICD-10-CM | POA: Diagnosis not present

## 2023-10-16 MED ORDER — DOCUSATE SODIUM 100 MG PO CAPS
100.0000 mg | ORAL_CAPSULE | Freq: Two times a day (BID) | ORAL | 0 refills | Status: DC
Start: 2023-10-16 — End: 2023-10-28
  Filled 2023-10-16: qty 10, 5d supply, fill #0

## 2023-10-16 MED ORDER — METHOCARBAMOL 500 MG PO TABS
500.0000 mg | ORAL_TABLET | Freq: Four times a day (QID) | ORAL | 0 refills | Status: DC | PRN
  Filled 2023-10-16: qty 120, 30d supply, fill #0

## 2023-10-16 MED ORDER — SENNA 8.6 MG PO TABS
1.0000 | ORAL_TABLET | Freq: Two times a day (BID) | ORAL | 0 refills | Status: DC | PRN
  Filled 2023-10-16: qty 30, 15d supply, fill #0

## 2023-10-16 MED ORDER — OXYCODONE HCL 5 MG PO TABS
5.0000 mg | ORAL_TABLET | ORAL | 0 refills | Status: DC | PRN
  Filled 2023-10-16: qty 30, 5d supply, fill #0

## 2023-10-16 MED ORDER — ACETAMINOPHEN 500 MG PO TABS
1000.0000 mg | ORAL_TABLET | Freq: Four times a day (QID) | ORAL | 0 refills | Status: AC | PRN
  Filled 2023-10-16: qty 60, 8d supply, fill #0

## 2023-10-16 NOTE — Plan of Care (Signed)

## 2023-10-16 NOTE — Anesthesia Postprocedure Evaluation (Signed)
 Anesthesia Post Note  Patient: Kari Lee  Procedure(s) Performed: ANTERIOR CERVICAL DECOMPRESSION/DISCECTOMY FUSION 2 LEVELS  Patient location during evaluation: PACU Anesthesia Type: General Level of consciousness: awake and alert Pain management: pain level controlled Vital Signs Assessment: post-procedure vital signs reviewed and stable Respiratory status: spontaneous breathing, nonlabored ventilation, respiratory function stable and patient connected to nasal cannula oxygen Cardiovascular status: blood pressure returned to baseline and stable Postop Assessment: no apparent nausea or vomiting Anesthetic complications: no   No notable events documented.   Last Vitals:  Vitals:   10/15/23 2003 10/16/23 0240  BP: (!) 158/86 (!) 160/70  Pulse: 86 61  Resp: 17 17  Temp: (!) 36.4 C 36.7 C  SpO2: 93% 92%    Last Pain:  Vitals:   10/16/23 0340  TempSrc:   PainSc: Asleep                 Debby Mines

## 2023-10-16 NOTE — Plan of Care (Signed)
  Problem: Education: Goal: Knowledge of General Education information will improve Description: Including pain rating scale, medication(s)/side effects and non-pharmacologic comfort measures Outcome: Progressing   Problem: Clinical Measurements: Goal: Diagnostic test results will improve Outcome: Progressing   Problem: Activity: Goal: Risk for activity intolerance will decrease Outcome: Progressing   Problem: Nutrition: Goal: Adequate nutrition will be maintained Outcome: Progressing   Problem: Coping: Goal: Level of anxiety will decrease Outcome: Progressing   Problem: Pain Managment: Goal: General experience of comfort will improve and/or be controlled Outcome: Progressing

## 2023-10-16 NOTE — Evaluation (Signed)
 Physical Therapy Evaluation Patient Details Name: Kari Lee MRN: [id number] DOB: Nov 21, 1945 Today's Date: 10/16/2023  History of Present Illness  Pt is a 78 year old female with a history of progressive cervical myeloradiculopathy.  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  Was taken to the OR for C4-C6 anterior cervical discectomy and fusion.  Clinical Impression  Pt able to perform bed mobility and transfers with MOD I -supervision. Pt able to ambulate 246ft with RW with MOD I. Pt demonstrates understanding of cervical precautions while ambulating and performing functional mobility. After ambulating, SPT noted inc in RR. Pt demonstrates good standing balance and is able to perform steady reaching outside of BOS to make her bed prior to returning to bed. Pt demonstrates deficits in activity tolerance and strength. Would benefit from skilled PT to address above deficits and promote optimal return to PLOF.        If plan is discharge home, recommend the following: A little help with walking and/or transfers;A little help with bathing/dressing/bathroom;Assistance with cooking/housework;Help with stairs or ramp for entrance   Can travel by private vehicle        Equipment Recommendations Rolling walker (2 wheels)  Recommendations for Other Services       Functional Status Assessment Patient has had a recent decline in their functional status and demonstrates the ability to make significant improvements in function in a reasonable and predictable amount of time.     Precautions / Restrictions Precautions Precautions: Cervical Recall of Precautions/Restrictions: Intact Restrictions Weight Bearing Restrictions Per Provider Order: No      Mobility  Bed Mobility Overal bed mobility: Modified Independent              General bed mobility comments: HOB elevated. Pt able to go from supine<>sit with increased time.    Transfers Overall transfer level: Needs assistance Equipment used: Rolling walker (2 wheels) Transfers: Sit to/from Stand, Bed to chair/wheelchair/BSC Sit to Stand: Supervision   Step pivot transfers: Supervision       General transfer comment: able to push up from bed from lowest bed height for STS    Ambulation/Gait Ambulation/Gait assistance: Modified independent (Device/Increase time) Gait Distance (Feet): 200 Feet Assistive device: Rolling walker (2 wheels) Gait Pattern/deviations: WFL(Within Functional Limits)       General Gait Details: noted inc in RR after ambulating. Use of RW for balance while amb  Stairs            Wheelchair Mobility     Tilt Bed    Modified Rankin (Stroke Patients Only)       Balance Overall balance assessment: Modified Independent Sitting-balance support: No upper extremity supported, Feet supported Sitting balance-Leahy Scale: Normal     Standing balance support: No upper extremity supported, During functional activity, Reliant on assistive device for balance Standing balance-Leahy Scale: Good Standing balance comment: Pt demonstrates good standing balance while reaching outside of BOS to make her bed before returning into bed. SBA for safety.                             Pertinent Vitals/Pain Pain Assessment Pain Assessment: No/denies pain    Home Living Family/patient expects to be discharged to:: Private residence Living Arrangements: Spouse/significant other Available Help at Discharge: Family (husband) Type of Home: House Home Access: Stairs to  enter Entrance Stairs-Rails: Right;Left (wide) Entrance Stairs-Number of Steps: 5   Home Layout: Multi-level;Laundry or work area in [employer] Equipment: Agricultural consultant (2 wheels);Cane - single point;Grab bars - tub/shower Additional Comments: pt  reports that while she does not have BSC, she does has a comform height commode and uses counter and sink.  Pt is adamant that she does not want a BSC.    Prior Function Prior Level of Function : Independent/Modified Independent             Mobility Comments: fall in february. ADLs Comments: independent     Extremity/Trunk Assessment   Upper Extremity Assessment Upper Extremity Assessment: Generalized weakness    Lower Extremity Assessment Lower Extremity Assessment: Defer to PT evaluation    Cervical / Trunk Assessment Cervical / Trunk Assessment: Neck Surgery  Communication   Communication Communication: No apparent difficulties    Cognition Arousal: Alert Behavior During Therapy: WFL for tasks assessed/performed   PT - Cognitive impairments: No apparent impairments                       PT - Cognition Comments: pleasant and agreeable to PT Following commands: Intact       Cueing Cueing Techniques: Verbal cues     General Comments      Exercises Other Exercises Other Exercises: edu about BLT precautions Other Exercises: edu about benefits of HH PT   Assessment/Plan    PT Assessment Patient needs continued PT services  PT Problem List Decreased strength;Decreased range of motion;Decreased activity tolerance;Decreased balance;Decreased mobility;Decreased knowledge of use of DME       PT Treatment Interventions DME instruction;Gait training;Functional mobility training;Therapeutic activities;Therapeutic exercise;Balance training;Neuromuscular re-education;Cognitive remediation;Patient/family education    PT Goals (Current goals can be found in the Care Plan section)  Acute Rehab PT Goals Patient Stated Goal: to go home PT Goal Formulation: With patient Time For Goal Achievement: 10/30/23 Potential to Achieve Goals: Good    Frequency 7X/week     Co-evaluation               AM-PAC PT 6 Clicks Mobility  Outcome Measure Help  needed turning from your back to your side while in a flat bed without using bedrails?: None Help needed moving from lying on your back to sitting on the side of a flat bed without using bedrails?: None Help needed moving to and from a bed to a chair (including a wheelchair)?: None Help needed standing up from a chair using your arms (e.g., wheelchair or bedside chair)?: None Help needed to walk in hospital room?: A Little Help needed climbing 3-5 steps with a railing? : A Little 6 Click Score: 22    End of Session Equipment Utilized During Treatment: Gait belt Activity Tolerance: Patient tolerated treatment well Patient left: in bed;with call bell/phone within reach;with bed alarm set Nurse Communication: Mobility status PT Visit Diagnosis: Other abnormalities of gait and mobility (R26.89)    Time: 9063-9040 PT Time Calculation (min) (ACUTE ONLY): 23 min   Charges:                 Terilynn Buresh, SPT   Favor Hackler 10/16/2023, 11:51 AM

## 2023-10-16 NOTE — TOC Transition Note (Signed)
 Transition of Care [facility]) - Discharge Note   Patient Details  Name: Kari Lee MRN: [id number] Date of Birth: 03-24-1945  Transition of [facility]) CM/SW Contact:  Alvaro Louder, LCSW Phone Number: 10/16/2023, 12:03 PM   Clinical Narrative:     ISRAEL discussed PT recommendation of HH Patient was agreeable. LCSWA reached out to [facility] admissions coordinator an started service for patient. The patient reported that he would have her husband come pick him up at discharge. LCSWA discussed PT recommendation  of RW  Patient was agreeable, LCSWA reached out to [employer] coordinator to set up equipment delivery.  TOC signing off  Final next level of care: Home w Home Health Services Barriers to Discharge: No Barriers Identified   Patient Goals and CMS Choice            Discharge Placement              Patient chooses bed at:  (Home) Patient to be transferred to facility by: Husband Name of family member notified: Self Patient and family notified of of transfer: 10/16/23  Discharge Plan and Services Additional resources added to the After Visit Summary for                  DME Arranged: Walker rolling DME Agency: [employer]       HH Arranged: PT, OT HH Agency: [facility] Date HH Agency Contacted: 10/16/23   Representative spoke with at [facility] Agency: [place]   Social Drivers of Health (SDOH) Interventions SDOH Screenings   Food Insecurity: No Food Insecurity (10/15/2023)  Housing: Low Risk  (10/15/2023)  Transportation Needs: No Transportation Needs (10/15/2023)  Utilities: Not At Risk (10/15/2023)  Alcohol Screen: Low Risk  (07/02/2023)  Depression (PHQ2-9): Low Risk  (07/02/2023)  Financial Resource Strain: Low Risk  (08/20/2023)   Received from [facility]  Physical Activity: Inactive (07/02/2023)  Social Connections: Moderately Integrated (10/15/2023)  Stress: No Stress Concern Present (07/02/2023)  Tobacco Use: Low Risk   (10/15/2023)  Health Literacy: Adequate Health Literacy (07/02/2023)     Readmission Risk Interventions     No data to display

## 2023-10-16 NOTE — Progress Notes (Signed)
 All discharge requirements met. Rosaline DELENA Range, RN

## 2023-10-16 NOTE — Evaluation (Signed)
 Occupational Therapy Evaluation Patient Details Name: Kari Lee MRN: [id number] DOB: 1945/11/28 Today's Date: 10/16/2023   History of Present Illness   Pt is a 78 year old female with a history of progressive cervical myeloradiculopathy.  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  Was taken to the OR for C4-C6 anterior cervical discectomy and fusion.     Clinical Impressions Pt seen for OT evaluation this date, POD#1 from C4-C6 anterior cervical discectomy and fusion. Prior to hospital admission, pt was independent with mobility, ADL, and IADL, reports a fall earlier this year with injury. Pt lives with spouse in a 2 level house with walk-out basement with 5 steps to enter and no handrails with spouse able to provide 24/7 assist/support as needed for pt. Currently pt is Supervision level with all aspects of mobility and self care tasks. Pt educated in cervical precautions, self care skills, AE, and home/routines modifications to maximize safety and functional independence while minimizing falls risk and maintaining precautions. Pt verbalized understanding of all education/training provided. Able to return demonstration safe techniques while maintaining cervical precautions throughout. Pt reported decreased ROM, strength, and coordination in bilateral hands due to report of report of arthritis and was educated on AE (foam built-up grips that can be applied to handles of various items along with cutlery with built-up grips) to support proficient engagement in ADLs that require fine motor coordination.  Anticipate the need for follow up OT services upon acute hospital DC to safely progress pt along continuum of care.     If plan is discharge home, recommend the following:   A little help with walking and/or transfers;Assistance  with cooking/housework;Assist for transportation     Functional Status Assessment   Patient has had a recent decline in their functional status and demonstrates the ability to make significant improvements in function in a reasonable and predictable amount of time.     Equipment Recommendations   Other (comment);BSC/3in1 (BSC was initially recommended however pt reports that she has a comfort height commode and has a counter and windowsil to hold to assist with transfers on/off.  Pt adamantly refused BSC at home stating that she does not need.)     Recommendations for Other Services         Precautions/Restrictions   Precautions Precautions: Cervical Recall of Precautions/Restrictions: Intact Restrictions Weight Bearing Restrictions Per Provider Order: No     Mobility Bed Mobility Overal bed mobility: Modified Independent             General bed mobility comments: with HOB elevated    Transfers Overall transfer level: Needs assistance Equipment used: Rolling walker (2 wheels) Transfers: Sit to/from Stand, Bed to chair/wheelchair/BSC Sit to Stand: Supervision           General transfer comment: Ambulated to bathroom at RW level with Supervision      Balance Overall balance assessment: Needs assistance Sitting-balance support: Feet supported, No upper extremity supported Sitting balance-Leahy Scale: Normal     Standing balance support: Single extremity supported, Bilateral upper extremity supported, No upper extremity supported, During functional activity Standing balance-Leahy Scale: Good Standing balance comment: Pt with good dynamic standing balance at RW level while completing ADLs.  Able to weight shift during functional reaching to desired items placed near sink with Supervision  ADL either performed or assessed with clinical judgement   ADL Overall ADL's : Needs assistance/impaired     Grooming: Wash/dry  hands;Standing;Supervision/safety               Lower Body Dressing: Supervision/safety;Sit to/from stand   Toilet Transfer: Supervision/safety;BSC/3in1;Rolling walker (2 wheels);Grab bars   Toileting- Clothing Manipulation and Hygiene: Supervision/safety;Sit to/from stand       Functional mobility during ADLs: Supervision/safety;Rolling walker (2 wheels);Cueing for safety General ADL Comments: cuing for safe management of RW during standing components of ADLs     Vision Patient Visual Report: No change from baseline       Perception         Praxis         Pertinent Vitals/Pain Pain Assessment Pain Assessment: No/denies pain     Extremity/Trunk Assessment Upper Extremity Assessment Upper Extremity Assessment: Generalized weakness   Lower Extremity Assessment Lower Extremity Assessment: Defer to PT evaluation       Communication Communication Communication: No apparent difficulties   Cognition Arousal: Alert Behavior During Therapy: WFL for tasks assessed/performed Cognition: No apparent impairments                               Following commands: Intact       Cueing  General Comments   Cueing Techniques: Verbal cues      Exercises Other Exercises Other Exercises: education on role of OT in Acute and home health Other Exercises: fall risk management education Other Exercises: Education on AE (built-up grips) to support fine motor coordination.  Also educated on cutlery with built up handles.  Pt very interested in these pieces of AE to support engagement in self feeding.   Shoulder Instructions      Home Living Family/patient expects to be discharged to:: Private residence Living Arrangements: Spouse/significant other Available Help at Discharge: Family Type of Home: House Home Access: Stairs to enter [employer] of Steps: 5 Entrance Stairs-Rails: Right;Left (wide) Home Layout: Multi-level;Laundry or work area in  basement     [employer] Shower/Tub: Chief Strategy Officer: Handicapped height (raised toilet)     Home Equipment: Agricultural consultant (2 wheels);Cane - single point;Grab bars - tub/shower   Additional Comments: pt reports that while she does not have BSC, she does has a comform height commode and uses counter and sink.  Pt is adamant that she does not want a BSC.      Prior Functioning/Environment Prior Level of Function : Independent/Modified Independent             Mobility Comments: fall in february. ADLs Comments: independent    OT Problem List: Decreased coordination;Decreased safety awareness;Decreased activity tolerance;Decreased knowledge of use of DME or AE;Impaired UE functional use   OT Treatment/Interventions: Self-care/ADL training;Therapeutic activities;Energy conservation;DME and/or AE instruction      OT Goals(Current goals can be found in the care plan section)   Acute Rehab OT Goals Patient Stated Goal: To return home OT Goal Formulation: With patient Time For Goal Achievement: 10/30/23 Potential to Achieve Goals: Good ADL Goals Pt Will Perform Grooming: with modified independence;standing Pt Will Transfer to Toilet: with modified independence;regular height toilet   OT Frequency:  Min 1X/week    Co-evaluation              AM-PAC OT 6 Clicks Daily Activity     Outcome Measure Help from another person eating meals?: None Help from another  person taking care of personal grooming?: None Help from another person toileting, which includes using toliet, bedpan, or urinal?: None Help from another person bathing (including washing, rinsing, drying)?: A Little Help from another person to put on and taking off regular upper body clothing?: None Help from another person to put on and taking off regular lower body clothing?: None 6 Click Score: 23   End of Session Equipment Utilized During Treatment: Rolling walker (2 wheels) Nurse  Communication: Mobility status  Activity Tolerance: Patient tolerated treatment well Patient left: in bed;with call bell/phone within reach;Other (comment) (handoff to SW/CM at end of session)  OT Visit Diagnosis: Muscle weakness (generalized) (M62.81)                Time: 8966-8948 OT Time Calculation (min): 18 min Charges:  OT General Charges $OT Visit: 1 Visit OT Evaluation $OT Eval Low Complexity: 1 Low OT Treatments $Self Care/Home Management : 8-22 mins  Harlene Sharps OTR/L   Harlene LITTIE Sharps 10/16/2023, 11:15 AM

## 2023-10-16 NOTE — Discharge Summary (Signed)
 Discharge Summary  Patient ID: Kari Lee MRN: [id number] DOB/AGE: 76/11/1945 76 y.o.  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 Her intraoperative course was uncomplicated. She was admitted overnight for pain control and therapy evaluation. She was seen and evaluated by therapy and deemed appropriate for discharge home with [facility] on POD1. She was given prescriptions for Oxycodone , Robaxin , and Senna to take as needed. She was instructed to resume her home Celebrex .   Consults: None  Significant Diagnostic Studies: NA  Treatments: surgery: as above. Please see separately dictated operative report for further details.   Discharge Exam: Blood pressure (!) 146/58, pulse 65, temperature 98.3 F (36.8 C), resp. rate 20, height 5' 2 (1.575 m), SpO2 96%. Incision is clean dry and intact.  No evidence of fluid collection.  Patient with no new deficits.  Antigravity throughout with some pain limitation as expected.  No new sensory deficits.  Disposition: Discharge disposition: 06-Home-Health Care Svc       Discharge Instructions     Incentive spirometry RT   Complete by: As directed    No dressing needed   Complete by: As directed       Allergies as of 10/16/2023       Reactions   Sulfa Antibiotics Nausea Only   Penicillins Rash   TOLERATED CEFAZOLIN         Medication List     TAKE these medications    acetaminophen  500 MG tablet Commonly known as: TYLENOL  Take 2 tablets (1,000 mg total) by mouth every 6 (six) hours as needed. What changed:   how much to take when to take this reasons to take this   atorvastatin  40 MG tablet Commonly known as: LIPITOR Take 1 tablet (40 mg total) by mouth daily. What changed: when to take this   celecoxib  100 MG capsule Commonly known as: CeleBREX  Take 1 capsule (100 mg total) by mouth 2 (two) times daily.   diclofenac  Sodium 1 % Gel Commonly known as: VOLTAREN  Apply 1 Application topically 4 (four) times daily as needed (pain). As needed   docusate sodium  100 MG capsule Commonly known as: Colace Take 1 capsule (100 mg total) by mouth 2 (two) times daily.   estradiol  0.5 MG tablet Commonly known as: ESTRACE  Take 1 tablet (0.5 mg total) by mouth daily.   losartan  50 MG tablet Commonly known as: COZAAR  Take 1 tablet (50 mg total) by mouth daily. What changed: when to take this   MAGNESIUM  GLYCINATE PO Take 600 mg by mouth every evening.   methocarbamol  500 MG tablet Commonly known as: ROBAXIN  Take 1 tablet (500 mg total) by mouth every 6 (six) hours as needed for muscle spasms.   multivitamin tablet Take 1 tablet by mouth daily.   oxyCODONE  5 MG immediate release tablet Commonly known as: Oxy IR/ROXICODONE  Take 1 tablet (5 mg total) by mouth every 3 (three) hours as needed for moderate pain (pain score 4-6).   senna 8.6 MG Tabs tablet Commonly known as: SENOKOT Take 1 tablet (8.6 mg total) by mouth 2 (two) times daily as needed for mild constipation.   vitamin C  1000  MG tablet Take 1,000 mg by mouth daily.   vitamin D3 50 MCG (2000 UT) Caps Take 2,000 Units by mouth daily.               Durable Medical Equipment  (From admission, onward)           Start     Ordered   10/16/23 1010  For home use only DME Walker rolling  Once       Question Answer Comment  Walker: With 5 Inch Wheels   Patient needs a walker to treat with the following condition S/P cervical spinal fusion      10/16/23 1009              Discharge Care Instructions  (From  admission, onward)           Start     Ordered   10/16/23 0000  No dressing needed        10/16/23 1101             Signed: Edsel Jama Goods 10/16/2023, 11:05 AM

## 2023-10-16 NOTE — Progress Notes (Signed)
 Attending Progress Note  History: Kari Lee is here for C4-6 ANTERIOR CERVICAL DISCECTOMY AND FUSION, they are currently 1 Day Post-Op.   POD1: Overall doing well.  Tolerating breakfast.  Pain is controlled.  Physical Exam: Vitals:   10/15/23 2003 10/16/23 0240  BP: (!) 158/86 (!) 160/70  Pulse: 86 61  Resp: 17 17  Temp: (!) 97.5 F (36.4 C) 98.1 F (36.7 C)  SpO2: 93% 92%    AA Ox3 CNI  Strength: Neurologic examination at baseline, no new deficits.  Data:  No results for input(s): NA, K, CL, CO2, BUN, CREATININE, LABGLOM, GLUCOSE, CALCIUM  in the last 168 hours. No results for input(s): AST, ALT, ALKPHOS in the last 168 hours.  Invalid input(s): TBILI   No results for input(s): WBC, HGB, HCT, PLT in the last 168 hours. No results for input(s): APTT, INR in the last 168 hours.      DG Cervical Spine 2-3 Views Result Date: 10/15/2023 CLINICAL DATA:  Elective surgery. EXAM: CERVICAL SPINE - 2-3 VIEW COMPARISON:  Cervical spine radiograph dated 08/19/2023. FINDINGS: Two intraoperative fluoroscopic spot images provided. The total fluoroscopic time is 16 seconds with a cumulative air Karma 4.9 mm. Cervical spine fusion noted. IMPRESSION: Intraoperative fluoroscopic spot images of the cervical spine. Electronically Signed   By: Vanetta Chou M.D.   On: 10/15/2023 15:46   DG C-Arm 1-60 Min-No Report Result Date: 10/15/2023 Fluoroscopy was utilized by the requesting physician.  No radiographic interpretation.   DG C-Arm 1-60 Min-No Report Result Date: 10/15/2023 Fluoroscopy was utilized by the requesting physician.  No radiographic interpretation.   DG C-Arm 1-60 Min-No Report Result Date: 10/15/2023 Fluoroscopy was utilized by the requesting physician.  No radiographic interpretation.   Scheduled Meds:  acetaminophen   1,000 mg Oral Q6H   vitamin C   1,000 mg Oral Daily   atorvastatin   40 mg Oral Daily   celecoxib   100 mg Oral BID    cholecalciferol   2,000 Units Oral Daily   docusate sodium   100 mg Oral BID   enoxaparin  (LOVENOX ) injection  40 mg Subcutaneous Q24H   estradiol   0.5 mg Oral Daily   losartan   50 mg Oral QHS   magnesium  oxide  400 mg Oral QPM   pantoprazole  (PROTONIX ) IV  40 mg Intravenous QHS   senna  1 tablet Oral BID   sodium chloride  flush  3 mL Intravenous Q12H   Continuous Infusions:  sodium chloride  1 mL/hr at 10/16/23 0343   PRN Meds:.acetaminophen  **OR** acetaminophen , bisacodyl , HYDROmorphone  (DILAUDID ) injection, magnesium  citrate, menthol  **OR** phenol, methocarbamol  **OR** methocarbamol  (ROBAXIN ) injection, ondansetron  **OR** ondansetron  (ZOFRAN ) IV, oxyCODONE , oxyCODONE , polyethylene glycol, sodium chloride  flush  Other tests/results: None  Assessment/Plan:  Kari Lee currently postoperative day 1 from anterior cervical discectomy and fusion from C4-C6.  Overall doing very well.  Eating well tolerating her oral intake.  Pain is controlled.  - Drains: None - mobilize - pain control - DVT prophylaxis - PTOT  [name], MD/MSCR Department of Neurosurgery

## 2023-10-17 DIAGNOSIS — M4802 Spinal stenosis, cervical region: Secondary | ICD-10-CM | POA: Diagnosis not present

## 2023-10-17 DIAGNOSIS — R718 Other abnormality of red blood cells: Secondary | ICD-10-CM | POA: Diagnosis not present

## 2023-10-17 DIAGNOSIS — M5412 Radiculopathy, cervical region: Secondary | ICD-10-CM | POA: Diagnosis not present

## 2023-10-19 ENCOUNTER — Telehealth: Payer: Self-pay

## 2023-10-19 DIAGNOSIS — M48061 Spinal stenosis, lumbar region without neurogenic claudication: Secondary | ICD-10-CM | POA: Diagnosis not present

## 2023-10-19 DIAGNOSIS — Z96642 Presence of left artificial hip joint: Secondary | ICD-10-CM | POA: Diagnosis not present

## 2023-10-19 DIAGNOSIS — Z791 Long term (current) use of non-steroidal anti-inflammatories (NSAID): Secondary | ICD-10-CM | POA: Diagnosis not present

## 2023-10-19 DIAGNOSIS — Z79890 Hormone replacement therapy (postmenopausal): Secondary | ICD-10-CM | POA: Diagnosis not present

## 2023-10-19 DIAGNOSIS — K219 Gastro-esophageal reflux disease without esophagitis: Secondary | ICD-10-CM | POA: Diagnosis not present

## 2023-10-19 DIAGNOSIS — Z9181 History of falling: Secondary | ICD-10-CM | POA: Diagnosis not present

## 2023-10-19 DIAGNOSIS — R32 Unspecified urinary incontinence: Secondary | ICD-10-CM | POA: Diagnosis not present

## 2023-10-19 DIAGNOSIS — E785 Hyperlipidemia, unspecified: Secondary | ICD-10-CM | POA: Diagnosis not present

## 2023-10-19 DIAGNOSIS — Z4789 Encounter for other orthopedic aftercare: Secondary | ICD-10-CM | POA: Diagnosis not present

## 2023-10-19 DIAGNOSIS — M51369 Other intervertebral disc degeneration, lumbar region without mention of lumbar back pain or lower extremity pain: Secondary | ICD-10-CM | POA: Diagnosis not present

## 2023-10-19 DIAGNOSIS — M4722 Other spondylosis with radiculopathy, cervical region: Secondary | ICD-10-CM | POA: Diagnosis not present

## 2023-10-19 DIAGNOSIS — Z6831 Body mass index (BMI) 31.0-31.9, adult: Secondary | ICD-10-CM | POA: Diagnosis not present

## 2023-10-19 DIAGNOSIS — M4802 Spinal stenosis, cervical region: Secondary | ICD-10-CM | POA: Diagnosis not present

## 2023-10-19 DIAGNOSIS — Z981 Arthrodesis status: Secondary | ICD-10-CM | POA: Diagnosis not present

## 2023-10-19 DIAGNOSIS — G4700 Insomnia, unspecified: Secondary | ICD-10-CM | POA: Diagnosis not present

## 2023-10-19 DIAGNOSIS — E559 Vitamin D deficiency, unspecified: Secondary | ICD-10-CM | POA: Diagnosis not present

## 2023-10-19 DIAGNOSIS — M4712 Other spondylosis with myelopathy, cervical region: Secondary | ICD-10-CM | POA: Diagnosis not present

## 2023-10-19 DIAGNOSIS — F419 Anxiety disorder, unspecified: Secondary | ICD-10-CM | POA: Diagnosis not present

## 2023-10-19 DIAGNOSIS — Z96653 Presence of artificial knee joint, bilateral: Secondary | ICD-10-CM | POA: Diagnosis not present

## 2023-10-19 DIAGNOSIS — M1990 Unspecified osteoarthritis, unspecified site: Secondary | ICD-10-CM | POA: Diagnosis not present

## 2023-10-19 DIAGNOSIS — I10 Essential (primary) hypertension: Secondary | ICD-10-CM | POA: Diagnosis not present

## 2023-10-19 DIAGNOSIS — E669 Obesity, unspecified: Secondary | ICD-10-CM | POA: Diagnosis not present

## 2023-10-19 NOTE — Telephone Encounter (Signed)
 Copied from CRM #[id number]. Topic: Clinical - Home Health Verbal Orders >> Oct 19, 2023  2:27 PM Myrick T wrote: Caller/Agency: Annabella Gander from [facility] Callback Number: [phone] Service Requested: Physical Therapy Frequency: 2x2w Any new concerns about the patient? Yes Navigating balance due to surgrey

## 2023-10-20 NOTE — Telephone Encounter (Signed)
VO given to Tiffany  

## 2023-10-26 DIAGNOSIS — M1990 Unspecified osteoarthritis, unspecified site: Secondary | ICD-10-CM | POA: Diagnosis not present

## 2023-10-27 NOTE — Telephone Encounter (Unsigned)
 Copied from CRM #[id number]. Topic: Clinical - Home Health Verbal Orders >> Oct 27, 2023  9:17 AM Precious C wrote: Caller/Agency: [facility] Callback Number: [phone] Service Requested: Occupational Therapy Frequency: ONCE A WEEK FOR 6WKS Any new concerns about the patient? No

## 2023-10-27 NOTE — Telephone Encounter (Signed)
VO given to Kari Lee 

## 2023-10-28 ENCOUNTER — Ambulatory Visit (INDEPENDENT_AMBULATORY_CARE_PROVIDER_SITE_OTHER): Admitting: Physician Assistant

## 2023-10-28 ENCOUNTER — Encounter: Payer: Self-pay | Admitting: Physician Assistant

## 2023-10-28 VITALS — BP 166/78 | Temp 98.7°F | Ht 63.0 in | Wt 187.0 lb

## 2023-10-28 DIAGNOSIS — M4802 Spinal stenosis, cervical region: Secondary | ICD-10-CM

## 2023-10-28 DIAGNOSIS — Z09 Encounter for follow-up examination after completed treatment for conditions other than malignant neoplasm: Secondary | ICD-10-CM

## 2023-10-28 DIAGNOSIS — G992 Myelopathy in diseases classified elsewhere: Secondary | ICD-10-CM

## 2023-10-28 NOTE — Progress Notes (Signed)
   REFERRING PHYSICIAN:  Sowles, Krichna, Md [address] [place],  [place] [address]  DOS: 10/15/23, C4-6 ACDF  HISTORY OF PRESENT ILLNESS: Kari Lee is 2 weeks status post C4-6 ACDF. Overall, she is doing well.  She has had improvements in her right upper extremity.  She does still have intermittent numbness and tingling in bilateral upper extremities as well as in her right lower extremity.  This often comes and flares.  Overall she feels though she is doing well.  No difficulty swallowing.   PHYSICAL EXAMINATION:  NEUROLOGICAL:  General: In no acute distress.   Awake, alert, oriented to person, place, and time.  Pupils equal round and reactive to light.  Facial tone is symmetric.    Strength: Side Biceps Triceps Deltoid Interossei Grip Wrist Ext. Wrist Flex.  R 4+ 4+ 5 4+ 4+ 4+ 5  L 5 5 5 5 5 5 5    Incision c/d/I  Imaging:  No new imaging to review  Assessment / Plan: Kari Lee is doing well after C4-6 ACDF. We discussed activity escalation and I have advised the patient to lift up to 10 pounds until 6 weeks after surgery, then increase up to 25 pounds until 12 weeks after surgery.  After 12 weeks post-op, the patient advised to increase activity as tolerated. she will return to clinic in approximately 1 month for 6-week postop visit.  Patient was counseled on recovery after surgery in setting of cervical myelopathy.  She was asked to contact us  if any questions or concerns arise.    Lyle Decamp PA-C Dept of Neurosurgery

## 2023-10-29 ENCOUNTER — Other Ambulatory Visit: Payer: Self-pay | Admitting: Family Medicine

## 2023-10-29 DIAGNOSIS — R232 Flushing: Secondary | ICD-10-CM

## 2023-11-05 DIAGNOSIS — M48061 Spinal stenosis, lumbar region without neurogenic claudication: Secondary | ICD-10-CM | POA: Diagnosis not present

## 2023-11-11 DIAGNOSIS — E785 Hyperlipidemia, unspecified: Secondary | ICD-10-CM | POA: Diagnosis not present

## 2023-11-11 DIAGNOSIS — Z4789 Encounter for other orthopedic aftercare: Secondary | ICD-10-CM | POA: Diagnosis not present

## 2023-11-11 DIAGNOSIS — E669 Obesity, unspecified: Secondary | ICD-10-CM | POA: Diagnosis not present

## 2023-11-11 DIAGNOSIS — E559 Vitamin D deficiency, unspecified: Secondary | ICD-10-CM | POA: Diagnosis not present

## 2023-11-11 DIAGNOSIS — Z96642 Presence of left artificial hip joint: Secondary | ICD-10-CM | POA: Diagnosis not present

## 2023-11-11 DIAGNOSIS — Z6831 Body mass index (BMI) 31.0-31.9, adult: Secondary | ICD-10-CM | POA: Diagnosis not present

## 2023-11-11 DIAGNOSIS — I10 Essential (primary) hypertension: Secondary | ICD-10-CM | POA: Diagnosis not present

## 2023-11-11 DIAGNOSIS — K219 Gastro-esophageal reflux disease without esophagitis: Secondary | ICD-10-CM | POA: Diagnosis not present

## 2023-11-11 DIAGNOSIS — G4700 Insomnia, unspecified: Secondary | ICD-10-CM | POA: Diagnosis not present

## 2023-11-11 DIAGNOSIS — R32 Unspecified urinary incontinence: Secondary | ICD-10-CM | POA: Diagnosis not present

## 2023-11-18 DIAGNOSIS — M48061 Spinal stenosis, lumbar region without neurogenic claudication: Secondary | ICD-10-CM | POA: Diagnosis not present

## 2023-11-18 DIAGNOSIS — E785 Hyperlipidemia, unspecified: Secondary | ICD-10-CM | POA: Diagnosis not present

## 2023-11-18 DIAGNOSIS — Z96642 Presence of left artificial hip joint: Secondary | ICD-10-CM | POA: Diagnosis not present

## 2023-11-18 DIAGNOSIS — Z79890 Hormone replacement therapy (postmenopausal): Secondary | ICD-10-CM | POA: Diagnosis not present

## 2023-11-18 DIAGNOSIS — M4722 Other spondylosis with radiculopathy, cervical region: Secondary | ICD-10-CM | POA: Diagnosis not present

## 2023-11-18 DIAGNOSIS — M51369 Other intervertebral disc degeneration, lumbar region without mention of lumbar back pain or lower extremity pain: Secondary | ICD-10-CM | POA: Diagnosis not present

## 2023-11-18 DIAGNOSIS — Z791 Long term (current) use of non-steroidal anti-inflammatories (NSAID): Secondary | ICD-10-CM | POA: Diagnosis not present

## 2023-11-18 DIAGNOSIS — F419 Anxiety disorder, unspecified: Secondary | ICD-10-CM | POA: Diagnosis not present

## 2023-11-18 DIAGNOSIS — G4700 Insomnia, unspecified: Secondary | ICD-10-CM | POA: Diagnosis not present

## 2023-11-18 DIAGNOSIS — Z9181 History of falling: Secondary | ICD-10-CM | POA: Diagnosis not present

## 2023-11-18 DIAGNOSIS — I10 Essential (primary) hypertension: Secondary | ICD-10-CM | POA: Diagnosis not present

## 2023-11-18 DIAGNOSIS — R32 Unspecified urinary incontinence: Secondary | ICD-10-CM | POA: Diagnosis not present

## 2023-11-18 DIAGNOSIS — M1990 Unspecified osteoarthritis, unspecified site: Secondary | ICD-10-CM | POA: Diagnosis not present

## 2023-11-18 DIAGNOSIS — K219 Gastro-esophageal reflux disease without esophagitis: Secondary | ICD-10-CM | POA: Diagnosis not present

## 2023-11-18 DIAGNOSIS — Z4789 Encounter for other orthopedic aftercare: Secondary | ICD-10-CM | POA: Diagnosis not present

## 2023-11-18 DIAGNOSIS — Z981 Arthrodesis status: Secondary | ICD-10-CM | POA: Diagnosis not present

## 2023-11-18 DIAGNOSIS — Z6831 Body mass index (BMI) 31.0-31.9, adult: Secondary | ICD-10-CM | POA: Diagnosis not present

## 2023-11-18 DIAGNOSIS — M4802 Spinal stenosis, cervical region: Secondary | ICD-10-CM | POA: Diagnosis not present

## 2023-11-18 DIAGNOSIS — E559 Vitamin D deficiency, unspecified: Secondary | ICD-10-CM | POA: Diagnosis not present

## 2023-11-18 DIAGNOSIS — E669 Obesity, unspecified: Secondary | ICD-10-CM | POA: Diagnosis not present

## 2023-11-18 DIAGNOSIS — Z96653 Presence of artificial knee joint, bilateral: Secondary | ICD-10-CM | POA: Diagnosis not present

## 2023-11-18 DIAGNOSIS — M4712 Other spondylosis with myelopathy, cervical region: Secondary | ICD-10-CM | POA: Diagnosis not present

## 2023-11-19 ENCOUNTER — Other Ambulatory Visit: Payer: Self-pay | Admitting: Family Medicine

## 2023-11-19 DIAGNOSIS — M5412 Radiculopathy, cervical region: Secondary | ICD-10-CM

## 2023-11-23 DIAGNOSIS — H5213 Myopia, bilateral: Secondary | ICD-10-CM | POA: Diagnosis not present

## 2023-11-25 ENCOUNTER — Encounter: Payer: Self-pay | Admitting: Neurosurgery

## 2023-11-25 ENCOUNTER — Ambulatory Visit (INDEPENDENT_AMBULATORY_CARE_PROVIDER_SITE_OTHER)

## 2023-11-25 ENCOUNTER — Ambulatory Visit: Admitting: Neurosurgery

## 2023-11-25 VITALS — BP 138/84 | Temp 98.5°F | Ht 63.0 in | Wt 187.0 lb

## 2023-11-25 DIAGNOSIS — M47812 Spondylosis without myelopathy or radiculopathy, cervical region: Secondary | ICD-10-CM | POA: Diagnosis not present

## 2023-11-25 DIAGNOSIS — Z981 Arthrodesis status: Secondary | ICD-10-CM

## 2023-11-25 DIAGNOSIS — Z09 Encounter for follow-up examination after completed treatment for conditions other than malignant neoplasm: Secondary | ICD-10-CM | POA: Diagnosis not present

## 2023-11-25 DIAGNOSIS — M5412 Radiculopathy, cervical region: Secondary | ICD-10-CM

## 2023-11-25 DIAGNOSIS — M419 Scoliosis, unspecified: Secondary | ICD-10-CM | POA: Diagnosis not present

## 2023-11-25 DIAGNOSIS — M4312 Spondylolisthesis, cervical region: Secondary | ICD-10-CM | POA: Diagnosis not present

## 2023-11-27 DIAGNOSIS — Z981 Arthrodesis status: Secondary | ICD-10-CM | POA: Insufficient documentation

## 2023-11-27 NOTE — Progress Notes (Signed)
   REFERRING PHYSICIAN:  Sowles, Krichna, Md [address] [place],  [place] [address]  DOS: 10/15/23, C4-6 ACDF  11/27/23 HISTORY OF PRESENT ILLNESS: PARTHENIA TELLEFSEN is 6 weeks status post C4-6 ACDF. Overall, she is doing well.  She does feel like she has had continued improvements in her right upper extremity.  As expected she does continue to have some sensation changes.  Her swallowing is improving.  Overall her recovery is on track.   PHYSICAL EXAMINATION:  NEUROLOGICAL:  General: In no acute distress.   Awake, alert, oriented to person, place, and time.  Pupils equal round and reactive to light.  Facial tone is symmetric.    Strength: Side Biceps Triceps Deltoid Interossei Grip Wrist Ext. Wrist Flex.  R 4+ 4+ 5 4+ 4+ 4+ 5  L 5 5 5 5 5 5 5    Incision c/d/I  Imaging:  No new imaging to review  Assessment / Plan: SINCLAIR ARRAZOLA is doing well after C4-6 ACDF.  She is currently 6 weeks out, she can go up to a 25 pound lifting restriction until 12 weeks.  Will continue to follow.  Overall she is doing very well and is having her expected recovery  [name], MD  Dept of Neurosurgery

## 2023-11-30 DIAGNOSIS — M1990 Unspecified osteoarthritis, unspecified site: Secondary | ICD-10-CM | POA: Diagnosis not present

## 2023-12-04 DIAGNOSIS — M1611 Unilateral primary osteoarthritis, right hip: Secondary | ICD-10-CM | POA: Diagnosis not present

## 2023-12-04 DIAGNOSIS — M25551 Pain in right hip: Secondary | ICD-10-CM | POA: Diagnosis not present

## 2023-12-04 DIAGNOSIS — M25561 Pain in right knee: Secondary | ICD-10-CM | POA: Diagnosis not present

## 2023-12-04 DIAGNOSIS — Z96651 Presence of right artificial knee joint: Secondary | ICD-10-CM | POA: Diagnosis not present

## 2023-12-20 ENCOUNTER — Other Ambulatory Visit: Payer: Self-pay | Admitting: Family Medicine

## 2023-12-20 DIAGNOSIS — E782 Mixed hyperlipidemia: Secondary | ICD-10-CM

## 2023-12-21 NOTE — Telephone Encounter (Signed)
 Requested Prescriptions  Pending Prescriptions Disp Refills   atorvastatin  (LIPITOR) 40 MG tablet [Pharmacy Med Name: ATORVASTATIN  40 MG TABLET] 90 tablet 1    Sig: TAKE 1 TABLET BY MOUTH EVERY DAY     Cardiovascular:  Antilipid - Statins Failed - 12/21/2023  4:39 PM      Failed - Lipid Panel in normal range within the last 12 months    Cholesterol  Date Value Ref Range Status  07/01/2023 167 <200 mg/dL Final   LDL Cholesterol (Calc)  Date Value Ref Range Status  07/01/2023 68 mg/dL (calc) Final    Comment:    Reference range: <100 . Desirable range <100 mg/dL for primary prevention;   <70 mg/dL for patients with CHD or diabetic patients  with > or = 2 CHD risk factors. SABRA LDL-C is now calculated using the Martin-Hopkins  calculation, which is a validated novel method providing  better accuracy than the Friedewald equation in the  estimation of LDL-C.  Gladis APPLETHWAITE et al. SANDREA. 7986;689(80): 2061-2068  (http://education.[facility].com/faq/FAQ164)    HDL  Date Value Ref Range Status  07/01/2023 78 > OR = 50 mg/dL Final   Triglycerides  Date Value Ref Range Status  07/01/2023 128 <150 mg/dL Final         Passed - Patient is not pregnant      Passed - Valid encounter within last 12 months    Recent Outpatient Visits           5 months ago Cervical myelopathy [facility] [place])   [facility] Glenard Mire, MD       Future Appointments             In 1 week Sowles, Krichna, MD [facility], [place]

## 2023-12-31 ENCOUNTER — Encounter: Payer: Self-pay | Admitting: Family Medicine

## 2023-12-31 ENCOUNTER — Ambulatory Visit: Admitting: Family Medicine

## 2023-12-31 VITALS — BP 146/84 | HR 78 | Temp 98.5°F | Resp 18 | Ht 63.0 in | Wt 188.9 lb

## 2023-12-31 DIAGNOSIS — I10 Essential (primary) hypertension: Secondary | ICD-10-CM | POA: Diagnosis not present

## 2023-12-31 DIAGNOSIS — E559 Vitamin D deficiency, unspecified: Secondary | ICD-10-CM | POA: Diagnosis not present

## 2023-12-31 DIAGNOSIS — Z1231 Encounter for screening mammogram for malignant neoplasm of breast: Secondary | ICD-10-CM | POA: Diagnosis not present

## 2023-12-31 DIAGNOSIS — M48061 Spinal stenosis, lumbar region without neurogenic claudication: Secondary | ICD-10-CM | POA: Diagnosis not present

## 2023-12-31 DIAGNOSIS — M19042 Primary osteoarthritis, left hand: Secondary | ICD-10-CM | POA: Diagnosis not present

## 2023-12-31 DIAGNOSIS — K219 Gastro-esophageal reflux disease without esophagitis: Secondary | ICD-10-CM

## 2023-12-31 DIAGNOSIS — Z9181 History of falling: Secondary | ICD-10-CM | POA: Diagnosis not present

## 2023-12-31 DIAGNOSIS — E7800 Pure hypercholesterolemia, unspecified: Secondary | ICD-10-CM

## 2023-12-31 DIAGNOSIS — Z9889 Other specified postprocedural states: Secondary | ICD-10-CM | POA: Diagnosis not present

## 2023-12-31 DIAGNOSIS — M25561 Pain in right knee: Secondary | ICD-10-CM

## 2023-12-31 DIAGNOSIS — M19041 Primary osteoarthritis, right hand: Secondary | ICD-10-CM | POA: Diagnosis not present

## 2023-12-31 DIAGNOSIS — Z23 Encounter for immunization: Secondary | ICD-10-CM | POA: Diagnosis not present

## 2023-12-31 MED ORDER — LOSARTAN POTASSIUM 100 MG PO TABS: 100.0000 mg | ORAL_TABLET | Freq: Every day | ORAL | 1 refills | Status: AC

## 2023-12-31 MED ORDER — CELECOXIB 100 MG PO CAPS: 100.0000 mg | ORAL_CAPSULE | Freq: Two times a day (BID) | ORAL | 1 refills | Status: AC

## 2023-12-31 NOTE — Progress Notes (Signed)
 Name: Kari Lee   MRN: [id number]    DOB: 1945/05/07   Date:12/31/2023       Progress Note  Subjective  Chief Complaint  Chief Complaint  Patient presents with   Medical Management of Chronic Issues   Discussed the use of AI scribe software for clinical note transcription with the patient, who gave verbal consent to proceed.  History of Present Illness Kari Lee is a 78 year old female with cervical spine surgery and arthritis who presents for a follow-up visit after a recent fall and ongoing joint pain.  She underwent cervical discectomy and fusion surgery on October 15, 2023, due to worsening numbness in her arms and hands. Post-surgery, some numbness in her arm improved, but she continues to experience significant pain in both hands, which she attributes to arthritis. Her hands hurt constantly, with significant deformity, particularly in her right hand, limiting her ability to make a full fist. She uses Voltaren  gel and occasionally takes Tylenol , but finds little relief from these treatments. She has seen a hand sub-specialist in the past and will call back to make an appointment   In early November 2025, she fell on her right side while carrying groceries, impacting her right hip and knee. This hip has not been replaced, unlike her other hip and both knees. Post-fall, she experienced pain and swelling in her right knee, which had not been problematic before. She was taking Celebrex  200 mg twice daily to manage inflammation as instructed by Ortho , but she has since reduced the dose as it was not effective. She also wears a knee sleeve for support. Despite these measures, she continues to experience anterior knee pain, particularly when standing or putting pressure on it. It is a very specific spot below knee joint on lateral anterior aspect of right knee  She has  hypertension, for which she takes losartan  50 mg, but her blood pressure has been elevated at recent medical  appointments. She has not been monitoring her blood pressure at home. She also takes atorvastatin  40 mg for hypercholesterolemia, which has been well-controlled, and she is compliant with her vitamin D  supplementation.  Her social history includes a recent trip to [place] for her grandson's wedding in August 2025. She lives independently and is cautious about fall prevention, wearing appropriate footwear and being mindful of her movements. She has a daughter who resides in [place].    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/05/2023   Cervical radiculopathy 09/05/2023   High hematocrit 07/01/2023   Cervical myelopathy (HCC) 11/01/2021   S/P left rotator cuff repair 11/06/2020   Vitamin D  deficiency 12/26/2019   DDD (degenerative disc disease), lumbar 12/26/2019   Spinal stenosis of lumbar region without neurogenic claudication 12/26/2019   Osteoarthritis 08/19/2016   Bradycardia 05/20/2016   Current long-term use of postmenopausal hormone replacement therapy 05/20/2016   GERD without esophagitis 05/20/2016   Hyperlipidemia, unspecified 05/20/2016   Essential hypertension 05/20/2016   History of hip replacement, total, 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    Past Surgical History:  Procedure Laterality Date   ABDOMINAL HYSTERECTOMY  1981   TAH , Bil oophorectomy   ANTERIOR CERVICAL DECOMP/DISCECTOMY FUSION N/A 10/15/2023   Procedure: ANTERIOR CERVICAL DECOMPRESSION/DISCECTOMY FUSION 2 LEVELS;  Surgeon: Claudene Penne ORN, MD;  Location: [facility];  Service: Neurosurgery;  Laterality: N/A;  C4-6 ANTERIOR CERVICAL DISCECTOMY AND FUSION   BILATERAL CARPAL  TUNNEL RELEASE     CERVICAL CONE BIOPSY     COLONOSCOPY  10/2010   COLONOSCOPY  2004   HALLUX VALGUS CORRECTION Right 2005   HYSTERECTOMY ABDOMINAL WITH SALPINGECTOMY  1981   INCISION TENDON SHEATH HAND Right    JOINT REPLACEMENT Right 11/21/2010    knee   JOINT REPLACEMENT Left 11/04/2011   hip   JOINT REPLACEMENT Left 04/27/2012   knee   KNEE ARTHROSCOPY Right    KNEE ARTHROSCOPY Left 12/18/2011   SHOULDER ARTHROSCOPY WITH SUBACROMIAL DECOMPRESSION, ROTATOR CUFF REPAIR AND BICEP TENDON REPAIR Left 09/20/2020   Procedure: SHOULDER ARTHROSCOPY WITH DEBRIDEMENT, DECOMPRESSION, ROTATOR CUFF REPAIR AND BICEPS TENODESIS;  Surgeon: Edie Norleen PARAS, MD;  Location: [facility];  Service: Orthopedics;  Laterality: Left;    Family History  Problem Relation Age of Onset   Alzheimer's disease Mother 79   Osteoporosis Mother    Miscarriages / Stillbirths Mother    Stroke Father 53   Hearing loss Father    Arthritis Sister    Basal cell carcinoma Sister    Hypertension Sister    Breast cancer Maternal Aunt 41   Macular degeneration Other     Social History   Tobacco Use   Smoking status: Never   Smokeless tobacco: Never  Substance Use Topics   Alcohol use: Yes    Alcohol/week: 1.0 standard drink of alcohol    Types: 1 Glasses of wine per week    Comment: occ wine     Current Outpatient Medications:    acetaminophen  (TYLENOL ) 500 MG tablet, Take 2 tablets (1,000 mg total) by mouth every 6 (six) hours as needed., Disp: 60 tablet, Rfl: 0   Ascorbic Acid  (VITAMIN C ) 1000 MG tablet, Take 1,000 mg by mouth daily., Disp: , Rfl:    atorvastatin  (LIPITOR) 40 MG tablet, TAKE 1 TABLET BY MOUTH EVERY DAY, Disp: 90 tablet, Rfl: 1   celecoxib  (CELEBREX ) 100 MG capsule, Take 1 capsule (100 mg total) by mouth 2 (two) times daily., Disp: 180 capsule, Rfl: 0   diclofenac  Sodium (VOLTAREN ) 1 % GEL, Apply 1 Application topically 4 (four) times daily as needed (pain). As needed, Disp: , Rfl:    estradiol  (ESTRACE ) 0.5 MG tablet, TAKE 1 TABLET BY MOUTH EVERY DAY, Disp: 90 tablet, Rfl: 0   losartan  (COZAAR ) 50 MG tablet, Take 1 tablet (50 mg total) by mouth daily. (Patient taking differently: Take 50 mg by mouth at bedtime.), Disp: 90 tablet, Rfl: 1    MAGNESIUM  GLYCINATE PO, Take 600 mg by mouth every evening., Disp: , Rfl:    methocarbamol  (ROBAXIN ) 500 MG tablet, Take 1 tablet (500 mg total) by mouth every 6 (six) hours as needed for muscle spasms., Disp: 120 tablet, Rfl: 0   Multiple Vitamin (MULTIVITAMIN) tablet, Take 1 tablet by mouth daily., Disp: , Rfl:    senna (SENOKOT) 8.6 MG TABS tablet, Take 1 tablet (8.6 mg total) by mouth 2 (two) times daily as needed for mild constipation., Disp: 30 tablet, Rfl: 0   Vitamin D , Cholecalciferol , 50 MCG (2000 UT) CAPS, Take 2,000 Units by mouth daily., Disp: , Rfl:   Allergies  Allergen Reactions   Sulfa Antibiotics Nausea Only   Penicillins Rash    TOLERATED CEFAZOLIN     I personally reviewed active problem list, medication list, allergies, family history with the patient/caregiver today.   ROS  Ten systems reviewed and is negative except as mentioned in HPI    Objective Physical Exam VITALS: BP- 150/ CONSTITUTIONAL: Patient  appears well-developed and well-nourished.  No distress. HEENT: Head atraumatic, normocephalic, neck supple. CARDIOVASCULAR: Normal rate, regular rhythm and normal heart sounds.  No murmur heard. No BLE edema. PULMONARY: Effort normal and breath sounds normal. No respiratory distress. ABDOMINAL: There is no tenderness or distention. MUSCULOSKELETAL: Normal gait. Without gross motor or sensory deficit. PSYCHIATRIC: Patient has a normal mood and affect. behavior is normal. Judgment and thought content normal.  Vitals:   12/31/23 0828  BP: (!) 156/84  Pulse: 78  Resp: 18  Temp: 98.5 F (36.9 C)  SpO2: 96%  Weight: 188 lb 14.4 oz (85.7 kg)  Height: 5' 3 (1.6 m)    Body mass index is 33.46 kg/m.  Recent Results (from the past 2160 hours)  CBC     Status: Abnormal   Collection Time: 10/08/23  8:43 AM  Result Value Ref Range   WBC 5.8 4.0 - 10.5 K/uL   RBC 4.94 3.87 - 5.11 MIL/uL   Hemoglobin 15.2 (H) 12.0 - 15.0 g/dL   HCT 53.6 (H) 63.9 - 53.9 %    MCV 93.7 80.0 - 100.0 fL   MCH 30.8 26.0 - 34.0 pg   MCHC 32.8 30.0 - 36.0 g/dL   RDW 85.5 88.4 - 84.4 %   Platelets 171 150 - 400 K/uL   nRBC 0.0 0.0 - 0.2 %    Comment: Performed at [facility], [address] [place], [place] [address]  Type and screen [facility]     Status: None   Collection Time: 10/08/23  8:43 AM  Result Value Ref Range   ABO/RH(D) O POS    Antibody Screen NEG    Sample Expiration 10/22/2023,2359    Extend sample reason      NO TRANSFUSIONS OR PREGNANCY IN THE PAST 3 MONTHS Performed at [facility], [address] [place], [place] [address]   Basic metabolic panel     Status: None   Collection Time: 10/08/23  8:43 AM  Result Value Ref Range   Sodium 140 135 - 145 mmol/L   Potassium 4.2 3.5 - 5.1 mmol/L   Chloride 106 98 - 111 mmol/L   CO2 26 22 - 32 mmol/L   Glucose, Bld 82 70 - 99 mg/dL    Comment: Glucose reference range applies only to samples taken after fasting for at least 8 hours.   BUN 19 8 - 23 mg/dL   Creatinine, Ser 9.38 0.44 - 1.00 mg/dL   Calcium  9.4 8.9 - 10.3 mg/dL   GFR, Estimated >39 >39 mL/min    Comment: (NOTE) Calculated using the CKD-EPI Creatinine Equation (2021)    Anion gap 8 5 - 15    Comment: Performed at [facility], [address] [place], [place] [address]  Surgical pcr screen     Status: None   Collection Time: 10/08/23  8:44 AM   Specimen: Nasal Mucosa; Nasal Swab  Result Value Ref Range   MRSA, PCR NEGATIVE NEGATIVE   Staphylococcus aureus NEGATIVE NEGATIVE    Comment: (NOTE) The Xpert SA Assay (FDA approved for NASAL specimens in patients 57 years of age and older), is one component of a comprehensive surveillance program. It is not intended to diagnose infection nor to guide or monitor treatment. Performed at [facility], [address] [place], [place] [address]   ABO/Rh     Status: None   Collection Time: 10/15/23 10:12 AM  Result Value  Ref Range   ABO/RH(D)  O POS Performed at [facility], [address] [place], [place] [address]       PHQ2/9:    12/31/2023    8:29 AM 07/02/2023   10:16 AM 07/01/2023    8:23 AM 12/30/2022   11:01 AM 10/10/2022    8:43 AM  Depression screen PHQ 2/9  Decreased Interest 0 0 0 0 0  Down, Depressed, Hopeless 0 0 0 0 0  PHQ - 2 Score 0 0 0 0 0  Altered sleeping 0 0 0 0 0  Tired, decreased energy 0 0 0 0 0  Change in appetite 0 0 0 0 0  Feeling bad or failure about yourself  0 0 0 0 0  Trouble concentrating 0 0 0 0 0  Moving slowly or fidgety/restless 0 0 0 0 0  Suicidal thoughts 0 0 0 0 0  PHQ-9 Score 0 0  0  0  0   Difficult doing work/chores Not difficult at all Not difficult at all Not difficult at all Not difficult at all      Data saved with a previous flowsheet row definition    phq 9 is negative  Fall Risk:    12/31/2023    8:29 AM 07/02/2023   10:20 AM 07/01/2023    8:22 AM 06/28/2023    2:10 PM 12/30/2022   11:01 AM  Fall Risk   Falls in the past year? 1 1 1 1 1   Number falls in past yr: 1 1 0 0 0  Injury with Fall? 1 1  1  1   0   Risk for fall due to : Impaired balance/gait;Impaired mobility;History of fall(s) History of fall(s);Impaired balance/gait Impaired balance/gait  Impaired balance/gait  Follow up Falls evaluation completed Falls evaluation completed;Falls prevention discussed Education provided;Falls evaluation completed;Falls prevention discussed  Falls prevention discussed;Education provided;Falls evaluation completed     Data saved with a previous flowsheet row definition     Assessment & Plan Multiple chronic conditions Chronic conditions not adequately managed with current treatment. Recent fall and neck surgery discussed. - Continue current management for chronic conditions. - Follow up with orthopedic surgeon for knee and hip evaluation. - Monitor blood pressure at home and report readings. - Continue vitamin D  supplementation. -  Ensure follow-up with hand specialist if needed.  Right knee pain and instability, status post knee replacement Pain and instability post knee replacement, exacerbated by recent fall. Swelling and anterior knee pain present. Prosthetic knee intact. - Continue wearing knee sleeve for support. - Monitor knee pain and swelling. - Follow up with orthopedic surgeon for further evaluation.  Right hip osteoarthritis, severe, with pain after fall Severe osteoarthritis with increased pain post-fall. Bone-on-bone contact noted. Potential need for hip replacement discussed. - Continue Celebrex  for inflammation and pain management. - Follow up with orthopedic surgeon for further evaluation and potential imaging.  Primary osteoarthritis, bilateral hands with deformity and pain Bilateral hand osteoarthritis with significant deformity and pain, especially in the right hand. Pain persists despite current management. - Consider referral to hand specialist if needed. - Continue using Voltaren  gel for pain relief.  Cervical spondylosis with history of myelopathy, status post cervical discectomy and fusion Persistent hand pain and numbness post-surgery. MRI shows significant degeneration and myelomalacia. - Follow up with neck surgeon for further evaluation and management.  Lumbar spinal stenosis Further evaluation needed. - Discuss with orthopedic surgeon during follow-up visit.  History of falls, increased fall risk Increased fall risk due to recent fall and  chronic conditions. Emphasis on fall prevention strategies. - Ensure use of closed-toe shoes and avoid sandals. - Monitor for signs of instability and take precautions to prevent falls.  Essential hypertension Hypertension with consistently elevated readings. Current management with losartan  50 mg is insufficient. - Increased losartan  to 100 mg daily. - Monitor blood pressure at home and report readings. - Adjust medication if blood pressure  drops too low.  Pure hypercholesterolemia, on statin therapy Hypercholesterolemia well-controlled with atorvastatin . Recent labs show satisfactory cholesterol levels. - Continue atorvastatin  40 mg daily.  Vitamin D  deficiency, on supplementation Vitamin D  deficiency managed with supplementation. - Continue vitamin D  supplementation.  Gastroesophageal reflux disease (GERD), stable GERD well-controlled with current management. Occasional use of Tums for breakthrough symptoms. - Continue current GERD management. - Use Tums as needed for breakthrough symptoms.  General health maintenance Discussion of mammogram and shingles vaccine. Flu shot discussed but not administered today. - Schedule and complete mammogram. - Consider shingles vaccine. - Consider flu shot at local pharmacy or during next visit.

## 2024-01-01 ENCOUNTER — Ambulatory Visit: Payer: Self-pay | Admitting: Family Medicine

## 2024-01-01 ENCOUNTER — Other Ambulatory Visit: Payer: Self-pay

## 2024-01-01 DIAGNOSIS — M5412 Radiculopathy, cervical region: Secondary | ICD-10-CM

## 2024-01-01 LAB — COMPREHENSIVE METABOLIC PANEL WITH GFR
AG Ratio: 1.8 (calc) (ref 1.0–2.5)
ALT: 20 U/L (ref 6–29)
AST: 24 U/L (ref 10–35)
Albumin: 4.5 g/dL (ref 3.6–5.1)
Alkaline phosphatase (APISO): 64 U/L (ref 37–153)
BUN: 18 mg/dL (ref 7–25)
CO2: 24 mmol/L (ref 20–32)
Calcium: 9 mg/dL (ref 8.6–10.4)
Chloride: 105 mmol/L (ref 98–110)
Creat: 0.72 mg/dL (ref 0.60–1.00)
Globulin: 2.5 g/dL (ref 1.9–3.7)
Glucose, Bld: 93 mg/dL (ref 65–99)
Potassium: 4.2 mmol/L (ref 3.5–5.3)
Sodium: 139 mmol/L (ref 135–146)
Total Bilirubin: 0.8 mg/dL (ref 0.2–1.2)
Total Protein: 7 g/dL (ref 6.1–8.1)
eGFR: 87 mL/min/1.73m2 (ref >=60)

## 2024-01-04 ENCOUNTER — Encounter: Admitting: Physician Assistant

## 2024-01-04 ENCOUNTER — Ambulatory Visit

## 2024-01-04 ENCOUNTER — Ambulatory Visit: Admitting: Neurosurgery

## 2024-01-04 ENCOUNTER — Other Ambulatory Visit

## 2024-01-04 ENCOUNTER — Encounter: Payer: Self-pay | Admitting: Neurosurgery

## 2024-01-04 VITALS — BP 136/90 | Temp 97.6°F | Ht 63.0 in | Wt 188.0 lb

## 2024-01-04 DIAGNOSIS — Z96651 Presence of right artificial knee joint: Secondary | ICD-10-CM | POA: Diagnosis not present

## 2024-01-04 DIAGNOSIS — Z981 Arthrodesis status: Secondary | ICD-10-CM

## 2024-01-04 DIAGNOSIS — M5412 Radiculopathy, cervical region: Secondary | ICD-10-CM

## 2024-01-04 DIAGNOSIS — M47812 Spondylosis without myelopathy or radiculopathy, cervical region: Secondary | ICD-10-CM | POA: Diagnosis not present

## 2024-01-04 DIAGNOSIS — Z09 Encounter for follow-up examination after completed treatment for conditions other than malignant neoplasm: Secondary | ICD-10-CM | POA: Diagnosis not present

## 2024-01-04 NOTE — Progress Notes (Signed)
   REFERRING PHYSICIAN:  Sowles, Krichna, Md [address] [place],  [place] [address]  DOS: 10/15/23, C4-6 ACDF  01/04/24 HISTORY OF PRESENT ILLNESS: Kari Lee is 12 weeks status post C4-6 ACDF. Overall, she is doing well. As expected she does continue to have some sensation changes.  Overall her recovery is on track.   PHYSICAL EXAMINATION:  NEUROLOGICAL:  General: In no acute distress.   Awake, alert, oriented to person, place, and time.  Pupils equal round and reactive to light.  Facial tone is symmetric.    Strength: Side Biceps Triceps Deltoid Interossei Grip Wrist Ext. Wrist Flex.  R 5 5 5  4+ 5 4+ 5  L 5 5 5 5 5 5 5    Incision c/d/I  Imaging:  No new imaging to review  Assessment / Plan: TAMARI Lee is doing well after C4-6 ACDF.  She is currently 12 weeks out. Overall doing very well. Improved sensation and pain. Overall she is doing very well and is having her expected recovery. Will see her at next follow up   [name], MD  Dept of Neurosurgery

## 2024-01-06 DIAGNOSIS — M19032 Primary osteoarthritis, left wrist: Secondary | ICD-10-CM | POA: Diagnosis not present

## 2024-01-06 DIAGNOSIS — M79641 Pain in right hand: Secondary | ICD-10-CM | POA: Diagnosis not present

## 2024-01-06 DIAGNOSIS — M19041 Primary osteoarthritis, right hand: Secondary | ICD-10-CM | POA: Diagnosis not present

## 2024-01-06 DIAGNOSIS — M180 Bilateral primary osteoarthritis of first carpometacarpal joints: Secondary | ICD-10-CM | POA: Diagnosis not present

## 2024-01-06 DIAGNOSIS — M19031 Primary osteoarthritis, right wrist: Secondary | ICD-10-CM | POA: Diagnosis not present

## 2024-01-06 DIAGNOSIS — M79642 Pain in left hand: Secondary | ICD-10-CM | POA: Diagnosis not present

## 2024-01-06 DIAGNOSIS — M19042 Primary osteoarthritis, left hand: Secondary | ICD-10-CM | POA: Diagnosis not present

## 2024-01-12 DIAGNOSIS — M1611 Unilateral primary osteoarthritis, right hip: Secondary | ICD-10-CM | POA: Diagnosis not present

## 2024-01-14 ENCOUNTER — Ambulatory Visit

## 2024-01-14 DIAGNOSIS — I10 Essential (primary) hypertension: Secondary | ICD-10-CM

## 2024-01-14 NOTE — Progress Notes (Signed)
 Patient is in office today for a nurse visit for Blood Pressure Check. Patient blood pressure was 132/80, Patient No chest pain, No shortness of breath, No dyspnea on exertion, No orthopnea, No paroxysmal nocturnal dyspnea, No edema, No palpitations, No syncope   Per last visit: Essential hypertension Hypertension with consistently elevated readings. Current management with losartan  50 mg is insufficient. - Increased losartan  to 100 mg daily. - Monitor blood pressure at home and report readings. - Adjust medication if blood pressure drops too low.

## 2024-02-02 ENCOUNTER — Ambulatory Visit
Admission: RE | Admit: 2024-02-02 | Discharge: 2024-02-02 | Disposition: A | Discharge status: Home / Self Care | Source: Ambulatory Visit | Attending: Family Medicine | Admitting: Family Medicine

## 2024-02-02 DIAGNOSIS — Z1231 Encounter for screening mammogram for malignant neoplasm of breast: Secondary | ICD-10-CM | POA: Diagnosis present

## 2024-02-03 ENCOUNTER — Other Ambulatory Visit: Payer: Self-pay | Admitting: Family Medicine

## 2024-02-03 DIAGNOSIS — R232 Flushing: Secondary | ICD-10-CM

## 2024-02-04 ENCOUNTER — Other Ambulatory Visit: Payer: Self-pay

## 2024-02-04 ENCOUNTER — Other Ambulatory Visit: Payer: Self-pay | Admitting: Family Medicine

## 2024-02-04 DIAGNOSIS — R928 Other abnormal and inconclusive findings on diagnostic imaging of breast: Secondary | ICD-10-CM

## 2024-02-04 DIAGNOSIS — N6489 Other specified disorders of breast: Secondary | ICD-10-CM

## 2024-02-04 NOTE — Telephone Encounter (Signed)
 Requested Prescriptions  Pending Prescriptions Disp Refills   estradiol  (ESTRACE ) 0.5 MG tablet [Pharmacy Med Name: ESTRADIOL  0.5 MG TABLET] 90 tablet 0    Sig: TAKE 1 TABLET BY MOUTH EVERY DAY     OB/GYN:  Estrogens  Failed - 02/04/2024 11:49 AM      Failed - Last BP in normal range    BP Readings from Last 1 Encounters:  01/04/24 (!) 136/90         Passed - Mammogram is up-to-date per Health Maintenance      Passed - Valid encounter within last 12 months    Recent Outpatient Visits           1 month ago H/O neck surgery   [place] Glenard Mire, MD   7 months ago Cervical myelopathy [facility])   [facility] Sowles, Krichna, MD

## 2024-02-08 ENCOUNTER — Ambulatory Visit
Admission: RE | Admit: 2024-02-08 | Discharge: 2024-02-08 | Disposition: A | Discharge status: Home / Self Care | Source: Ambulatory Visit | Attending: Family Medicine | Admitting: Family Medicine

## 2024-02-08 ENCOUNTER — Other Ambulatory Visit: Payer: Self-pay | Admitting: Family Medicine

## 2024-02-08 DIAGNOSIS — R928 Other abnormal and inconclusive findings on diagnostic imaging of breast: Secondary | ICD-10-CM

## 2024-02-10 ENCOUNTER — Inpatient Hospital Stay: Admission: RE | Admit: 2024-02-10 | Discharge: 2024-02-10 | Attending: Family Medicine | Admitting: Family Medicine

## 2024-02-10 ENCOUNTER — Ambulatory Visit
Admission: RE | Admit: 2024-02-10 | Discharge: 2024-02-10 | Disposition: A | Discharge status: Home / Self Care | Source: Ambulatory Visit | Attending: Family Medicine | Admitting: Family Medicine

## 2024-02-10 DIAGNOSIS — N6011 Diffuse cystic mastopathy of right breast: Secondary | ICD-10-CM | POA: Diagnosis present

## 2024-02-10 DIAGNOSIS — R928 Other abnormal and inconclusive findings on diagnostic imaging of breast: Secondary | ICD-10-CM | POA: Insufficient documentation

## 2024-02-10 HISTORY — PX: BREAST BIOPSY: SHX20

## 2024-02-10 MED ORDER — LIDOCAINE 1 % OPTIME INJ - NO CHARGE
2.0000 mL | Freq: Once | INTRAMUSCULAR | Status: AC
  Administered 2024-02-10: 2 mL
  Filled 2024-02-10: qty 2

## 2024-02-10 MED ORDER — LIDOCAINE-EPINEPHRINE 1 %-1:100000 IJ SOLN
5.0000 mL | Freq: Once | INTRAMUSCULAR | Status: AC
  Administered 2024-02-10: 5 mL
  Filled 2024-02-10: qty 10

## 2024-02-11 LAB — SURGICAL PATHOLOGY

## 2024-02-14 ENCOUNTER — Ambulatory Visit: Payer: Self-pay | Admitting: Family Medicine

## 2024-02-14 ENCOUNTER — Other Ambulatory Visit: Payer: Self-pay | Admitting: Family Medicine

## 2024-02-14 DIAGNOSIS — I10 Essential (primary) hypertension: Secondary | ICD-10-CM

## 2024-02-15 NOTE — Telephone Encounter (Signed)
 Too soon for refill.  Requested Prescriptions  Pending Prescriptions Disp Refills   losartan  (COZAAR ) 50 MG tablet [Pharmacy Med Name: LOSARTAN  POTASSIUM 50 MG TAB] 90 tablet 1    Sig: TAKE 1 TABLET BY MOUTH EVERY DAY     Cardiovascular:  Angiotensin Receptor Blockers Failed - 02/15/2024  3:58 PM      Failed - Last BP in normal range    BP Readings from Last 1 Encounters:  01/04/24 (!) 136/90         Passed - Cr in normal range and within 180 days    Creat  Date Value Ref Range Status  12/31/2023 0.72 0.60 - 1.00 mg/dL Final         Passed - K in normal range and within 180 days    Potassium  Date Value Ref Range Status  12/31/2023 4.2 3.5 - 5.3 mmol/L Final  05/20/2012 3.3 (L) 3.5 - 5.1 mmol/L Final         Passed - Patient is not pregnant      Passed - Valid encounter within last 6 months    Recent Outpatient Visits           1 month ago H/O neck surgery   [facility] Glenard Mire, MD   7 months ago Cervical myelopathy [facility])   [facility] Sowles, Krichna, MD

## 2024-02-16 ENCOUNTER — Ambulatory Visit: Admitting: Family Medicine

## 2024-02-16 ENCOUNTER — Encounter: Payer: Self-pay | Admitting: Family Medicine

## 2024-02-16 VITALS — BP 130/76 | HR 57 | Resp 16 | Ht 63.0 in | Wt 187.9 lb

## 2024-02-16 DIAGNOSIS — M1611 Unilateral primary osteoarthritis, right hip: Secondary | ICD-10-CM | POA: Diagnosis not present

## 2024-02-16 DIAGNOSIS — Z01818 Encounter for other preprocedural examination: Secondary | ICD-10-CM

## 2024-02-16 DIAGNOSIS — I10 Essential (primary) hypertension: Secondary | ICD-10-CM | POA: Diagnosis not present

## 2024-02-16 NOTE — Progress Notes (Signed)
 Name: Kari Lee   MRN: [id number]    DOB: 22-Dec-1945   Date:02/16/2024       Progress Note  Subjective  Chief Complaint  Chief Complaint  Patient presents with   Pre-op Exam   Discussed the use of AI scribe software for clinical note transcription with the patient, who gave verbal consent to proceed.  History of Present Illness Kari Lee is a 79 year old female with osteoarthritis who presents for preoperative evaluation for right total hip arthroplasty.  She is scheduled for a right total hip arthroplasty due to osteoarthritis, with a tentative date in March. Her hip pain is tolerable, but she guards it to avoid falls. She has had multiple surgeries in the past, including a cervical fusion in September, and has undergone general anesthesia without complications.  She recently had a breast biopsy, which was a small procedure and has healed well. She is also seeing a periodontist for an abscess evaluation, as clearance for the hip surgery is pending the resolution of this issue. The abscess was not deep enough to require oral surgery, but it must be cleared before proceeding with the hip replacement.  Her current medications include losartan  100 mg for hypertension and Celebrex  for pain and inflammation. She does not take any blood thinners or aspirin.  She has a history of hypertension, which is currently well-controlled with a blood pressure reading of 130/76. No shortness of breath, chest pain, or recent respiratory infections. She did not receive a flu shot this season.  She underwent a recent echocardiogram and EKG as part of her preoperative cardiac clearance, due to a history of heart murmur and occasional arrhythmia. She is awaiting the results.    Patient Active Problem List   Diagnosis Date Noted   S/P cervical spinal fusion 11/27/2023   High hematocrit 07/01/2023   S/P left rotator cuff repair 11/06/2020   Vitamin D  deficiency 12/26/2019   DDD (degenerative disc  disease), lumbar 12/26/2019   Spinal stenosis of lumbar region without neurogenic claudication 12/26/2019   Osteoarthritis 08/19/2016   Bradycardia 05/20/2016   Current long-term use of postmenopausal hormone replacement therapy 05/20/2016   GERD without esophagitis 05/20/2016   Hyperlipidemia, unspecified 05/20/2016   Essential hypertension 05/20/2016   History of hip replacement, total, 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    Past Surgical History:  Procedure Laterality Date   ABDOMINAL HYSTERECTOMY  1981   TAH , Bil oophorectomy   ANTERIOR CERVICAL DECOMP/DISCECTOMY FUSION N/A 10/15/2023   Procedure: ANTERIOR CERVICAL DECOMPRESSION/DISCECTOMY FUSION 2 LEVELS;  Surgeon: Claudene Penne ORN, MD;  Location: [facility];  Service: Neurosurgery;  Laterality: N/A;  C4-6 ANTERIOR CERVICAL DISCECTOMY AND FUSION   BILATERAL CARPAL TUNNEL RELEASE     BREAST BIOPSY Right 02/10/2024   US  RT BREAST BX W LOC DEV 1ST LESION IMG BX SPEC US  GUIDE 02/10/2024 [facility]   CERVICAL CONE BIOPSY     COLONOSCOPY  10/2010   COLONOSCOPY  2004   HALLUX VALGUS CORRECTION Right 2005   HYSTERECTOMY ABDOMINAL WITH SALPINGECTOMY  1981   INCISION TENDON SHEATH HAND Right    JOINT REPLACEMENT Right 11/21/2010   knee   JOINT REPLACEMENT Left 11/04/2011   hip   JOINT REPLACEMENT Left 04/27/2012   knee   KNEE ARTHROSCOPY Right    KNEE ARTHROSCOPY Left 12/18/2011   SHOULDER ARTHROSCOPY WITH SUBACROMIAL DECOMPRESSION, ROTATOR CUFF REPAIR AND BICEP TENDON REPAIR Left 09/20/2020   Procedure: SHOULDER ARTHROSCOPY WITH DEBRIDEMENT, DECOMPRESSION, ROTATOR CUFF REPAIR AND BICEPS TENODESIS;  Surgeon: Edie,  Norleen PARAS, MD;  Location: [facility];  Service: Orthopedics;  Laterality: Left;    Family History  Problem Relation Age of Onset   Alzheimer's disease Mother 63   Osteoporosis Mother    Miscarriages / Stillbirths Mother    Stroke Father 17   Hearing loss Father    Arthritis Sister    Basal cell carcinoma Sister    Hypertension Sister     Breast cancer Maternal Aunt 86   Macular degeneration Other     Social History   Tobacco Use   Smoking status: Never   Smokeless tobacco: Never  Substance Use Topics   Alcohol use: Yes    Alcohol/week: 1.0 standard drink of alcohol    Types: 1 Glasses of wine per week    Comment: occ wine    Current Medications[1]  Allergies[2]  I personally reviewed active problem list, medication list, allergies, family history with the patient/caregiver today.   ROS  Ten systems reviewed and is negative except as mentioned in HPI    Objective Physical Exam  CONSTITUTIONAL: Patient appears well-developed and well-nourished.  No distress. HEENT: Head atraumatic, normocephalic, neck supple. CARDIOVASCULAR: Normal rate, regular rhythm and normal heart sounds.  No murmur heard. No BLE edema. PULMONARY: Effort normal and breath sounds normal. No respiratory distress. ABDOMINAL: There is no tenderness or distention. MUSCULOSKELETAL: hand deformities from OA and gets up from chair slowly  PSYCHIATRIC: Patient has a normal mood and affect. behavior is normal. Judgment and thought content normal.  Vitals:   02/16/24 1107  BP: 130/76  Pulse: (!) 57  Resp: 16  SpO2: 98%  Weight: 187 lb 14.4 oz (85.2 kg)  Height: 5' 3 (1.6 m)    Body mass index is 33.28 kg/m.  Recent Results (from the past 2160 hours)  Comprehensive metabolic panel with GFR     Status: None   Collection Time: 12/31/23  9:08 AM  Result Value Ref Range   Glucose, Bld 93 65 - 99 mg/dL    Comment: .            Fasting reference interval .    BUN 18 7 - 25 mg/dL   Creat 9.27 9.39 - 8.99 mg/dL   eGFR 87 > OR = 60 fO/fpw/8.26f7   BUN/Creatinine Ratio SEE NOTE: 6 - 22 (calc)    Comment:    Not Reported: BUN and Creatinine are within    reference range. .    Sodium 139 135 - 146 mmol/L   Potassium 4.2 3.5 - 5.3 mmol/L   Chloride 105 98 - 110 mmol/L   CO2 24 20 - 32 mmol/L   Calcium  9.0 8.6 - 10.4 mg/dL    Total Protein 7.0 6.1 - 8.1 g/dL   Albumin 4.5 3.6 - 5.1 g/dL   Globulin 2.5 1.9 - 3.7 g/dL (calc)   AG Ratio 1.8 1.0 - 2.5 (calc)   Total Bilirubin 0.8 0.2 - 1.2 mg/dL   Alkaline phosphatase (APISO) 64 37 - 153 U/L   AST 24 10 - 35 U/L   ALT 20 6 - 29 U/L  Surgical pathology     Status: None   Collection Time: 02/10/24 12:00 AM  Result Value Ref Range   SURGICAL PATHOLOGY      SURGICAL PATHOLOGY [facility] [address] [place], [place] [address] Telephone [phone] or [phone] Fax [phone]  REPORT OF SURGICAL PATHOLOGY   Accession #: [id number] Patient Name: Iqbal, Matha Visit # :  [id number]  MRN: [id number] Physician: Anice PONCE Rush DOB/Age [birth year] (Age: 89) Gender: F Collected Date: 02/10/2024 Received Date: 02/10/2024  FINAL DIAGNOSIS       1. Breast, right, needle core biopsy, 8:00 6cmfn 7mm (heart clip) :       BENIGN BREAST SHOWING FIBROCYSTIC CHANGES INCLUDING STROMAL FIBROSIS, CYSTIC      DILATATION OF DUCTS AND USUAL DUCT HYPERPLASIA      NEGATIVE FOR MICROCALCIFICATIONS      NEGATIVE FOR ATYPIA AND CARCINOMA SPECIFIC       Diagnosis Note : Diagnosis called to [facility] at [facility]      at [facility] by Dr. Reed on 02/11/2023 at 10:10 AM.      DATE SIGNED OUT: 02/11/2024 ELECTRONIC SIGNATURE : Picklesimer Md, Prentice , Pathologist, Ele ctronic Signature  MICROSCOPIC DESCRIPTION  CASE COMMENTS STAINS USED IN DIAGNOSIS: H&E-2 H&E-3 H&E-4 H&E    CLINICAL HISTORY  SPECIMEN(S) OBTAINED 1. Breast, right, needle core biopsy, 8:00 6cmfn 7mm (heart Clip)  SPECIMEN COMMENTS: 1. TIF: 1050, CIT: < 3 minutes SPECIMEN CLINICAL INFORMATION: 1. indeterminate right breast mass    Gross Description 1. Received in formalin labeled right breast 8 o'clock 6 cm from nipple are multiple white-yellow fibroadipose tissue fragments aggregating to 1.4 x 0.7 x 0.3 cm. The  specimen is entirely submitted in 1 cassette.      TIF: 1050 on 02/10/24      CIT: Less than 3 minutes      VG 02/10/2024        Report signed out from the following location(s) [facility] [address] [place], [place] [address] CLIA #: [id number]  [facility] [address] [place], [place] [address] CLIA #: [id number]     Diabetic Foot Exam:     PHQ2/9:    02/16/2024   11:03 AM 12/31/2023    8:29 AM 07/02/2023   10:16 AM 07/01/2023    8:23 AM 12/30/2022   11:01 AM  Depression screen PHQ 2/9  Decreased Interest 0 0 0 0 0  Down, Depressed, Hopeless 0 0 0 0 0  PHQ - 2 Score 0 0 0 0 0  Altered sleeping 0 0 0 0 0  Tired, decreased energy 0 0 0 0 0  Change in appetite 0 0 0 0 0  Feeling bad or failure about yourself  0 0 0 0 0  Trouble concentrating 0 0 0 0 0  Moving slowly or fidgety/restless 0 0 0 0 0  Suicidal thoughts 0 0 0 0 0  PHQ-9 Score 0 0 0  0  0   Difficult doing work/chores Not difficult at all Not difficult at all Not difficult at all Not difficult at all Not difficult at all     Data saved with a previous flowsheet row definition    phq 9 is negative  Fall Risk:    02/16/2024   11:03 AM 12/31/2023    8:29 AM 07/02/2023   10:20 AM 07/01/2023    8:22 AM 06/28/2023    2:10 PM  Fall Risk   Falls in the past year? 1 1 1 1 1    Number falls in past yr: 1 1 1  0 0   Injury with Fall? 1 1 1  1  1     Risk for fall due to : Impaired balance/gait Impaired balance/gait;Impaired mobility;History of fall(s) History of fall(s);Impaired balance/gait Impaired balance/gait   Follow up Falls evaluation completed Falls  evaluation completed Falls evaluation completed;Falls prevention discussed Education provided;Falls evaluation completed;Falls prevention discussed      Manually entered by patient   Data saved with a previous flowsheet row definition      Assessment & Plan Preoperative evaluation for right total hip  arthroplasty Tentative date for surgery. Awaiting periodontist and cardiologist clearance. No anesthesia complications. No blood thinners or aspirin. Celebrex  may affect coagulation. - Await periodontist clearance for hip surgery. - Hold Celebrex  a few days before surgery. - Stop all supplements and vitamins one week before surgery. - Take blood pressure medication and cholesterol medication the night before surgery. - Proceed with cardiac clearance from cardiologist. - Attempt to reschedule follow-up appointment to avoid conflict with daughter's graduation.  Primary osteoarthritis of right hip Chronic pain due to osteoarthritis in the right hip, tolerable but requires guarding. - Proceed with right total hip arthroplasty as scheduled.  Hypertension Blood pressure well-controlled at 130/76 mmHg on losartan  100 mg. Celebrex  may increase blood pressure. - Continue losartan  100 mg for blood pressure management. - Monitor blood pressure, especially post-surgery when pain decreases.        [1]  Current Outpatient Medications:    acetaminophen  (TYLENOL ) 500 MG tablet, Take 2 tablets (1,000 mg total) by mouth every 6 (six) hours as needed., Disp: 60 tablet, Rfl: 0   Ascorbic Acid  (VITAMIN C ) 1000 MG tablet, Take 1,000 mg by mouth daily., Disp: , Rfl:    atorvastatin  (LIPITOR) 40 MG tablet, TAKE 1 TABLET BY MOUTH EVERY DAY, Disp: 90 tablet, Rfl: 1   celecoxib  (CELEBREX ) 100 MG capsule, Take 1 capsule (100 mg total) by mouth 2 (two) times daily., Disp: 180 capsule, Rfl: 1   diclofenac  Sodium (VOLTAREN ) 1 % GEL, Apply 1 Application topically 4 (four) times daily as needed (pain). As needed, Disp: , Rfl:    estradiol  (ESTRACE ) 0.5 MG tablet, TAKE 1 TABLET BY MOUTH EVERY DAY, Disp: 90 tablet, Rfl: 0   losartan  (COZAAR ) 100 MG tablet, Take 1 tablet (100 mg total) by mouth daily., Disp: 90 tablet, Rfl: 1   MAGNESIUM  GLYCINATE PO, Take 600 mg by mouth every evening., Disp: , Rfl:    Multiple Vitamin  (MULTIVITAMIN) tablet, Take 1 tablet by mouth daily., Disp: , Rfl:    Vitamin D , Cholecalciferol , 50 MCG (2000 UT) CAPS, Take 2,000 Units by mouth daily., Disp: , Rfl:  [2]  Allergies Allergen Reactions   Sulfa Antibiotics Nausea Only   Penicillins Rash    TOLERATED CEFAZOLIN 

## 2024-03-01 ENCOUNTER — Other Ambulatory Visit: Payer: Self-pay | Admitting: Orthopedic Surgery

## 2024-03-31 ENCOUNTER — Ambulatory Visit: Admit: 2024-03-31 | Admitting: Orthopedic Surgery

## 2024-06-30 ENCOUNTER — Ambulatory Visit: Admitting: Family Medicine

## 2024-07-01 ENCOUNTER — Ambulatory Visit: Admitting: Family Medicine

## 2024-07-14 ENCOUNTER — Ambulatory Visit
